# Patient Record
Sex: Female | Born: 1965 | Race: White | Hispanic: No | Smoking: Current every day smoker
Health system: Southern US, Community
[De-identification: ages and names within clinical notes are randomized; demographics above are authoritative.]

## PROBLEM LIST (undated history)

## (undated) DIAGNOSIS — J189 Pneumonia, unspecified organism: Secondary | ICD-10-CM

## (undated) DIAGNOSIS — I509 Heart failure, unspecified: Secondary | ICD-10-CM

## (undated) DIAGNOSIS — R072 Precordial pain: Secondary | ICD-10-CM

## (undated) DIAGNOSIS — J449 Chronic obstructive pulmonary disease, unspecified: Secondary | ICD-10-CM

## (undated) DIAGNOSIS — E119 Type 2 diabetes mellitus without complications: Secondary | ICD-10-CM

## (undated) DIAGNOSIS — E559 Vitamin D deficiency, unspecified: Secondary | ICD-10-CM

## (undated) DIAGNOSIS — R87629 Unspecified abnormal cytological findings in specimens from vagina: Secondary | ICD-10-CM

## (undated) DIAGNOSIS — K219 Gastro-esophageal reflux disease without esophagitis: Secondary | ICD-10-CM

## (undated) DIAGNOSIS — F419 Anxiety disorder, unspecified: Secondary | ICD-10-CM

## (undated) DIAGNOSIS — J45909 Unspecified asthma, uncomplicated: Secondary | ICD-10-CM

## (undated) DIAGNOSIS — M1990 Unspecified osteoarthritis, unspecified site: Secondary | ICD-10-CM

## (undated) DIAGNOSIS — K760 Fatty (change of) liver, not elsewhere classified: Secondary | ICD-10-CM

## (undated) DIAGNOSIS — I2510 Atherosclerotic heart disease of native coronary artery without angina pectoris: Secondary | ICD-10-CM

## (undated) DIAGNOSIS — G4730 Sleep apnea, unspecified: Secondary | ICD-10-CM

## (undated) DIAGNOSIS — F32A Depression, unspecified: Secondary | ICD-10-CM

## (undated) DIAGNOSIS — R011 Cardiac murmur, unspecified: Secondary | ICD-10-CM

## (undated) DIAGNOSIS — E079 Disorder of thyroid, unspecified: Secondary | ICD-10-CM

## (undated) HISTORY — PX: TUBAL LIGATION: SHX77

## (undated) HISTORY — DX: Anxiety disorder, unspecified: F41.9

## (undated) HISTORY — DX: Sleep apnea, unspecified: G47.30

## (undated) HISTORY — DX: Depression, unspecified: F32.A

## (undated) HISTORY — DX: Gastro-esophageal reflux disease without esophagitis: K21.9

## (undated) HISTORY — PX: COSMETIC SURGERY: SHX468

## (undated) HISTORY — DX: Disorder of thyroid, unspecified: E07.9

## (undated) HISTORY — DX: Vitamin D deficiency, unspecified: E55.9

## (undated) HISTORY — PX: OTHER SURGICAL HISTORY: SHX169

## (undated) HISTORY — DX: Unspecified abnormal cytological findings in specimens from vagina: R87.629

---

## 2017-03-19 ENCOUNTER — Encounter: Payer: Self-pay | Admitting: Podiatry

## 2017-03-19 ENCOUNTER — Ambulatory Visit (INDEPENDENT_AMBULATORY_CARE_PROVIDER_SITE_OTHER): Payer: Medicaid Other | Admitting: Podiatry

## 2017-03-19 ENCOUNTER — Ambulatory Visit (INDEPENDENT_AMBULATORY_CARE_PROVIDER_SITE_OTHER): Payer: Medicaid Other

## 2017-03-19 VITALS — BP 105/67 | HR 77 | Resp 18

## 2017-03-19 DIAGNOSIS — R609 Edema, unspecified: Secondary | ICD-10-CM

## 2017-03-19 DIAGNOSIS — E1149 Type 2 diabetes mellitus with other diabetic neurological complication: Secondary | ICD-10-CM | POA: Diagnosis not present

## 2017-03-19 DIAGNOSIS — G8929 Other chronic pain: Secondary | ICD-10-CM

## 2017-03-19 DIAGNOSIS — M79673 Pain in unspecified foot: Secondary | ICD-10-CM

## 2017-03-19 DIAGNOSIS — Q828 Other specified congenital malformations of skin: Secondary | ICD-10-CM

## 2017-03-19 DIAGNOSIS — Q6689 Other  specified congenital deformities of feet: Secondary | ICD-10-CM

## 2017-03-19 MED ORDER — HYDROCODONE-ACETAMINOPHEN 7.5-300 MG PO TABS: 1.0000 | ORAL_TABLET | Freq: Two times a day (BID) | ORAL | 0 refills | Status: DC

## 2017-03-19 NOTE — Progress Notes (Signed)
Subjective:    Patient ID: Rachael Watson, female   DOB: 51 y.o.   MRN: [id number]   HPI Rachael Watson Presents the office they for concerns of painful neuropathy to both of her feet. She dyspnea to from [place] and she was under the care of a podiatrist in [place] where she was given hydrocodone for neuropathy. She has a states that she has painful calluses to her feet. She gets quite a bit of pain to her feet. Nighttime issues. He tried gabapentin and Lyrica without any significant improvement and this is why she was put on vicodin for pain. She states that she takes 2 a day but more as needed. She denies any open sores to her feet. She denies any claudication symptoms. She states she had diabetic shoes but she states that did not help and she does not want new ones. She has no other concerns.  Review of Systems  All other systems reviewed and are negative.       Objective:  Physical Exam General: AAO x3, NAD  Dermatological: Hyperkeratotic lesions bilateral hallux as well as submetatarsal 2. No underlying ulceration, drainage or any signs of infection present. There is no other open lesions or pre-ulcer lesions identified today. There is no overlying edema, erythema, increase in warmth bilaterally.  Vascular: Dorsalis Pedis artery and Posterior Tibial artery pedal pulses are 2/4 bilateral with immedate capillary fill time. P There is no pain with calf compression, swelling, warmth, erythema.   Neruologic: Sensation decreased with Rachael Watson monofilament. Decreased vibratory sensation bilaterally.  Musculoskeletal: There is no area pinpoint bony tenderness and no pain vibratory sensation. There is mild discomfort on the plantar medial tubercle of the calcaneus at the insertion the plantar fascia bilaterally but is no pain with lateral compression. No pain on the Achilles tendon. There is no area pinpoint bony tenderness. She states that she gets pain in both of her feet but this is from the  neuropathy she's been having her symptoms been unchanged for several years this point.  Gait: Unassisted, Nonantalgic.      Assessment:     51 year old female with symptomatic diabetic neuropathy, hyperkeratotic lesions     Plan:     -Treatment options discussed including all alternatives, risks, and complications -Etiology of symptoms were discussed -X-rays were obtained and reviewed with the patient. No evidence of acute fracture identified today. Joint space maintained -Hyperkeratotic lesions were sharply debrided 4 without any complications or bleeding. -Discussed stretching exercises for plantar fasciitis his heel pain. She states that she has neuropathy not causing her pain. I did give her one-time prescription of Vicodin and will place referral for pain management as I will not be managing her chronic pain. She understands this and she agrees to pain management. -Daily foot inspection discussed. -Follow-up in 3 months for routine care or sooner if any issues are to arise. Call any questions or concerns in the meantime.  Celesta Gentile, DPM

## 2017-03-22 ENCOUNTER — Telehealth: Payer: Self-pay | Admitting: *Deleted

## 2017-03-22 NOTE — Telephone Encounter (Addendum)
Faxed required form, clinicals and demographics to [facility].03/24/2017-[place] [facility] states pt has been scheduled for 04/28/2017 at 1:00pm with Dr. Brandy Hale, pt is aware of the appt.

## 2017-04-06 LAB — GLUCOSE, POCT (MANUAL RESULT ENTRY): POC Glucose: 91 mg/dl (ref 70–99)

## 2017-04-12 ENCOUNTER — Encounter: Payer: Self-pay | Admitting: Internal Medicine

## 2017-04-29 ENCOUNTER — Telehealth: Payer: Self-pay | Admitting: *Deleted

## 2017-04-29 NOTE — Telephone Encounter (Addendum)
[place] states pt cancelled today's 1:00pm appt, stating she used CBD oil, and the office manager told her it would go on record. Pt may be on Medicaid but currently have as self-pay. Pt called states she would like to go to a pain management closer to her due to spinal stenosis, peripheral neuropathy and liver problems, and after 5 minutes in the car she can barely stand to walk. I told pt Dr. Jacqualyn Posey would send to [facility]. Pt states she doesn't know when she would get an appt, and would like a refill of the pain medication. I told her I would inform Dr. Jacqualyn Posey of her request. I was unable to get through to [facility] by [phone] multiple attempts, the service stated the call could not be completed as dialed. I googled the [facility] and it was the phone number given by the pt. I left message informing pt I would need either a new number or for [facility] to contact me with their information including fax.

## 2017-04-30 MED ORDER — HYDROCODONE-ACETAMINOPHEN 7.5-300 MG PO TABS: ORAL_TABLET | ORAL | 0 refills | Status: DC

## 2017-04-30 NOTE — Telephone Encounter (Signed)
I informed pt of Dr. Leigh Aurora orders and she stated understanding. Pt states she is going to the [facility] instead of changing to [facility]. I spoke with [place] and she states pt was a No Show so she will have to see if their office manager will allow them to give her an appt.

## 2017-04-30 NOTE — Addendum Note (Signed)
Addended by: Harriett Sine D on: 04/30/2017 03:29 PM   Modules accepted: Orders

## 2017-04-30 NOTE — Addendum Note (Signed)
Addended by: Harriett Sine D on: 04/30/2017 03:26 PM   Modules accepted: Orders

## 2017-04-30 NOTE — Telephone Encounter (Signed)
She can have one last prescription of 10 pills but after that she needs to go to pain management.

## 2017-05-20 DIAGNOSIS — M79641 Pain in right hand: Secondary | ICD-10-CM | POA: Insufficient documentation

## 2017-06-08 ENCOUNTER — Ambulatory Visit: Payer: Medicaid Other | Admitting: Emergency Medicine

## 2017-06-08 ENCOUNTER — Encounter: Payer: Self-pay | Admitting: Internal Medicine

## 2017-06-08 ENCOUNTER — Telehealth: Payer: Self-pay | Admitting: Family Medicine

## 2017-06-08 NOTE — Telephone Encounter (Signed)
Copied from [place]. Topic: Medical Record Request - Patient ROI Request >> Jun 08, 2017 11:55 AM Yvette Rack wrote: Patient Name/DOB/MRN #: Rachael Watson  DOB December 21, 2065 MRN [id number] Requestor Name/Agency: [name] (self) Call Back #: [phone] Information Requested: all records that was sent to [facility] today  Please fax over by today so [facility] (fax [phone] Attn: Nicholes Mango ) can schedule her an appointment for her to be seen ASAP Phone number [phone]  Patient was never seen today because of insurance   Route to [facility] for [place] clinics. For all other clinics, route to the clinic's PEC Pool.

## 2017-06-08 NOTE — Telephone Encounter (Signed)
I called pt and informed her I could not find her medical records that  she said was sent here. She has had 2 appointments with PCP and they both have been cancelled. I looked in the providers box she was going to be seen by-her records were not there. She is going to call her previous doc and get them sent to [facility].

## 2017-06-21 ENCOUNTER — Ambulatory Visit: Payer: Medicaid Other | Admitting: Family Medicine

## 2017-06-21 NOTE — Progress Notes (Deleted)
   Subjective: [facility]:EN[facility]POE[facility] care, *** HPI: Rachael Watson is a 51 y.o. female presenting to clinic today for:  1. ***  Past Medical History:  Diagnosis Date  . GERD (gastroesophageal reflux disease)   . Thyroid disease    *** The histories are not reviewed yet. Please review them in the "History" navigator section and refresh this [place]. Social History   Socioeconomic History  . Marital status: Married    Spouse name: Not on file  . Number of children: Not on file  . Years of education: Not on file  . Highest education level: Not on file  Social Needs  . Financial resource strain: Not on file  . Food insecurity - worry: Not on file  . Food insecurity - inability: Not on file  . Transportation needs - medical: Not on file  . Transportation needs - non-medical: Not on file  Occupational History  . Not on file  Tobacco Use  . Smoking status: Unknown If Ever Smoked  . Smokeless tobacco: Never Used  Substance and Sexual Activity  . Alcohol use: Not on file  . Drug use: Not on file  . Sexual activity: Not on file  Other Topics Concern  . Not on file  Social History Narrative  . Not on file   No outpatient medications have been marked as taking for the 06/21/17 encounter (Appointment) with Janora Norlander, DO.   No family history on file. No Known Allergies   Health Maintenance: ***  Flu Vaccine: {YES/NO/WILD PNTIR:44315}  Tdap Vaccine: {YES/NO/WILD QMGQQ:76195}  - every 38yrs - (<3 lifetime doses or unknown): all wounds -- look up need for Tetanus IG - (>=3 lifetime doses): clean/minor wound if >12yrs from previous; all other wounds if >66yrs from previous Zoster Vaccine: {YES/NO/WILD CARDS:18581} (those >50yo, once) Pneumonia Vaccine: {YES/NO/WILD KDTOI:71245} (those w/ risk factors) - (<42yr) Both: Immunocompromised, cochlear implant, CSF leak, asplenic, sickle cell, Chronic Renal Failure - (<11yr) PPSV-23 only: Heart dz, lung disease, DM, tobacco abuse,  alcoholism, cirrhosis/liver disease. - (>59yr): PPSV13 then PPSV23 in 6-12mths;  - (>77yr): repeat PPSV23 once if pt received prior to 51yo and 48yrs have passed  ROS: Per HPI  Objective: Office vital signs reviewed. There were no vitals taken for this visit.  Physical Examination:  General: Awake, alert, *** nourished, No acute distress HEENT: Normal    Neck: No masses palpated. No lymphadenopathy    Ears: Tympanic membranes intact, normal light reflex, no erythema, no bulging    Eyes: PERRLA, extraocular movement in tact, sclera ***    Nose: nasal turbinates moist, *** nasal discharge    Throat: moist mucus membranes, no erythema, *** tonsillar exudate.  Airway is patent Cardio: regular rate and rhythm, S1S2 heard, no murmurs appreciated Pulm: clear to auscultation bilaterally, no wheezes, rhonchi or rales; normal work of breathing on room air GI: soft, non-tender, non-distended, bowel sounds present x4, no hepatomegaly, no splenomegaly, no masses GU: external vaginal tissue ***, cervix ***, *** punctate lesions on cervix appreciated, *** discharge from cervical os, *** bleeding, *** cervical motion tenderness, *** abdominal/ adnexal masses Extremities: warm, well perfused, No edema, cyanosis or clubbing; +*** pulses bilaterally MSK: *** gait and *** station Skin: dry; intact; no rashes or lesions Neuro: *** Strength and light touch sensation grossly intact, *** DTRs ***/4  Assessment/ Plan: 51 y.o. female   No problem-specific Assessment & Plan notes found for this encounter.   Janora Norlander, DO [place] [phone]

## 2017-07-01 DIAGNOSIS — M79676 Pain in unspecified toe(s): Secondary | ICD-10-CM

## 2017-07-02 ENCOUNTER — Ambulatory Visit: Payer: Medicaid Other | Admitting: Family

## 2017-07-02 ENCOUNTER — Encounter: Payer: Self-pay | Admitting: Family

## 2017-07-02 ENCOUNTER — Telehealth: Payer: Self-pay | Admitting: Family

## 2017-07-02 ENCOUNTER — Ambulatory Visit (INDEPENDENT_AMBULATORY_CARE_PROVIDER_SITE_OTHER): Payer: Medicaid Other

## 2017-07-02 VITALS — BP 97/63 | HR 71 | Temp 97.3°F | Ht 66.0 in | Wt 190.8 lb

## 2017-07-02 DIAGNOSIS — F411 Generalized anxiety disorder: Secondary | ICD-10-CM | POA: Diagnosis not present

## 2017-07-02 DIAGNOSIS — Z794 Long term (current) use of insulin: Principal | ICD-10-CM

## 2017-07-02 DIAGNOSIS — M48061 Spinal stenosis, lumbar region without neurogenic claudication: Secondary | ICD-10-CM | POA: Diagnosis not present

## 2017-07-02 DIAGNOSIS — M79642 Pain in left hand: Secondary | ICD-10-CM | POA: Diagnosis not present

## 2017-07-02 DIAGNOSIS — Z114 Encounter for screening for human immunodeficiency virus [HIV]: Secondary | ICD-10-CM | POA: Diagnosis not present

## 2017-07-02 DIAGNOSIS — Z1211 Encounter for screening for malignant neoplasm of colon: Secondary | ICD-10-CM

## 2017-07-02 DIAGNOSIS — E1149 Type 2 diabetes mellitus with other diabetic neurological complication: Secondary | ICD-10-CM

## 2017-07-02 DIAGNOSIS — K219 Gastro-esophageal reflux disease without esophagitis: Secondary | ICD-10-CM

## 2017-07-02 DIAGNOSIS — E1169 Type 2 diabetes mellitus with other specified complication: Secondary | ICD-10-CM | POA: Diagnosis not present

## 2017-07-02 DIAGNOSIS — F321 Major depressive disorder, single episode, moderate: Secondary | ICD-10-CM | POA: Diagnosis not present

## 2017-07-02 DIAGNOSIS — E119 Type 2 diabetes mellitus without complications: Secondary | ICD-10-CM | POA: Insufficient documentation

## 2017-07-02 DIAGNOSIS — M792 Neuralgia and neuritis, unspecified: Secondary | ICD-10-CM | POA: Insufficient documentation

## 2017-07-02 LAB — BAYER DCA HB A1C WAIVED: HB A1C: 5 % (ref <7.0)

## 2017-07-02 MED ORDER — GABAPENTIN 100 MG PO CAPS: ORAL_CAPSULE | ORAL | 1 refills | Status: DC

## 2017-07-02 MED ORDER — LEVOTHYROXINE SODIUM 100 MCG PO TABS: 100.0000 ug | ORAL_TABLET | Freq: Every day | ORAL | 1 refills | Status: DC

## 2017-07-02 MED ORDER — BLOOD GLUCOSE METER KIT: PACK | 0 refills | Status: DC

## 2017-07-02 MED ORDER — MECLIZINE HCL 12.5 MG PO TABS: ORAL_TABLET | ORAL | 2 refills | Status: DC

## 2017-07-02 MED ORDER — OMEPRAZOLE 20 MG PO CPDR: 20.0000 mg | DELAYED_RELEASE_CAPSULE | Freq: Every day | ORAL | 1 refills | Status: DC

## 2017-07-02 MED ORDER — BACLOFEN 10 MG PO TABS: ORAL_TABLET | ORAL | 3 refills | Status: DC

## 2017-07-02 NOTE — Progress Notes (Signed)
Subjective:    Patient ID: Rachael Watson, female    DOB: [birth year], 51 y.o.   MRN: [id number]  Pt presents to the office today to establish care. PT is followed by Pain Clinic every month for chronic low back pain.  Diabetes  She presents for her follow-up diabetic visit. She has type 2 diabetes mellitus. Her disease course has been stable. Associated symptoms include foot paresthesias. Pertinent negatives for diabetes include no blurred vision and no visual change. Symptoms are stable. Diabetic complications include heart disease and peripheral neuropathy. Pertinent negatives for diabetic complications include no CVA. She is following a generally unhealthy diet.  Depression         This is a chronic problem.  The current episode started more than 1 year ago.   The onset quality is gradual.   The problem occurs intermittently.  Associated symptoms include decreased concentration, restlessness and sad.  Associated symptoms include no helplessness and no hopelessness.  Past treatments include SSRIs - Selective serotonin reuptake inhibitors.  Past medical history includes chronic pain and anxiety.   Anxiety  Presents for follow-up visit. Symptoms include decreased concentration, excessive worry, irritability and restlessness. Symptoms occur most days. The severity of symptoms is moderate.    Back Pain  This is a chronic problem. The current episode started more than 1 year ago. The problem occurs constantly. The problem has been waxing and waning since onset. The pain is present in the lumbar spine. The quality of the pain is described as aching. The pain is at a severity of 10/10. The pain is moderate. Pertinent negatives include no numbness or tingling. She has tried analgesics for the symptoms. The treatment provided mild relief.  Gastroesophageal Reflux  She complains of heartburn. She reports no belching or no coughing. This is a chronic problem. The current episode started more than 1 year  ago. The problem occurs occasionally. She has tried a histamine-2 antagonist for the symptoms. The treatment provided moderate relief.  Hand Injury   The incident occurred 2 days ago. The injury mechanism was a direct blow. The pain is present in the left hand. The quality of the pain is described as aching. The pain does not radiate. The pain is at a severity of 9/10. The pain is moderate. The pain has been intermittent since the incident. Pertinent negatives include no numbness or tingling. The symptoms are aggravated by palpation. She has tried rest for the symptoms. The treatment provided mild relief.  Diabetic Neuropathy Pt currently taking gabapentin at bedtime as needed.     Review of Systems  Constitutional: Positive for irritability.  Eyes: Negative for blurred vision.  Respiratory: Negative for cough.   Gastrointestinal: Positive for heartburn.  Musculoskeletal: Positive for arthralgias and back pain.  Neurological: Negative for tingling and numbness.  Psychiatric/Behavioral: Positive for decreased concentration and depression.  All other systems reviewed and are negative.  History reviewed. No pertinent family history.  Social History   Socioeconomic History  . Marital status: Divorced    Spouse name: None  . Number of children: None  . Years of education: None  . Highest education level: None  Social Needs  . Financial resource strain: None  . Food insecurity - worry: None  . Food insecurity - inability: None  . Transportation needs - medical: None  . Transportation needs - non-medical: None  Occupational History  . None  Tobacco Use  . Smoking status: Current Every Day Smoker    Packs/day: 1.00  Types: Cigarettes  . Smokeless tobacco: Never Used  Substance and Sexual Activity  . Alcohol use: No    Frequency: Never  . Drug use: No  . Sexual activity: None  Other Topics Concern  . None  Social History Narrative  . None       Objective:   Physical  Exam  Constitutional: She is oriented to person, place, and time. She appears well-developed and well-nourished. No distress.  HENT:  Head: Normocephalic and atraumatic.  Right Ear: External ear normal.  Left Ear: External ear normal.  Nose: Nose normal.  Mouth/Throat: Oropharynx is clear and moist.  Eyes: Pupils are equal, round, and reactive to light.  Neck: Normal range of motion. Neck supple. No thyromegaly present.  Cardiovascular: Normal rate, regular rhythm, normal heart sounds and intact distal pulses.  No murmur heard. Pulmonary/Chest: Effort normal and breath sounds normal. No respiratory distress. She has no wheezes.  Abdominal: Soft. Bowel sounds are normal. She exhibits no distension. There is no tenderness.  Musculoskeletal: She exhibits tenderness (left hand). She exhibits no edema.  Lower lumbar pain with flexion and extension  Neurological: She is alert and oriented to person, place, and time.  Skin: Skin is warm and dry.  Psychiatric: She has a normal mood and affect. Her behavior is normal. Judgment and thought content normal.  Vitals reviewed.   BP 97/63   Pulse 71   Temp (!) 97.3 F (36.3 C) (Oral)   Ht _0  (1.676 m)   Wt 190 lb 12.8 oz (86.5 kg)   BMI 30.80 kg/m      Assessment & Plan:  1. Type 2 diabetes mellitus with other specified complication, with long-term current use of insulin (HCC) - BMP8+EGFR - Hepatic function panel - Lipid panel - Microalbumin / creatinine urine ratio - Bayer DCA Hb A1c Waived  2. Spinal stenosis of lumbar region, unspecified whether neurogenic claudication present - BMP8+EGFR - Hepatic function panel  3. Other diabetic neurological complication associated with type 2 diabetes mellitus (HCC) - BMP8+EGFR - Hepatic function panel  4. Depression, major, single episode, moderate (HCC) - BMP8+EGFR - Hepatic function panel  5. GAD (generalized anxiety disorder) - BMP8+EGFR - Hepatic function panel  6. Colon cancer  screening - Ambulatory referral to Gastroenterology - BMP8+EGFR - Hepatic function panel  7. Encounter for screening for HIV - BMP8+EGFR - Hepatic function panel - HIV antibody  8. Gastroesophageal reflux disease, esophagitis presence not specified  9. Pain of left hand - DG Hand Complete Left; Future   Continue all meds Labs pending Health Maintenance reviewed Diet and exercise encouraged RTO 3 months   Evelina Dun, FNP

## 2017-07-02 NOTE — Telephone Encounter (Signed)
Patient seen Rachael Watson today and forgot to tell her she needs refills on all her medications except norco and Celexa. Please send to [facility].

## 2017-07-02 NOTE — Patient Instructions (Signed)
Diabetic Neuropathy Diabetic neuropathy is a nerve disease or nerve damage that is caused by diabetes mellitus. About half of all people with diabetes mellitus have some form of nerve damage. Nerve damage is more common in those who have had diabetes mellitus for many years and who generally have not had good control of their blood sugar (glucose) level. Diabetic neuropathy is a common complication of diabetes mellitus. There are three common types of diabetic neuropathy and a fourth type that is less common and less understood:  Peripheral neuropathy-This is the most common type of diabetic neuropathy. It causes damage to the nerves of the feet and legs first and then eventually the hands and arms. The damage affects the ability to sense touch.  Autonomic neuropathy-This type causes damage to the autonomic nervous system, which controls the following functions: ? Heartbeat. ? Body temperature. ? Blood pressure. ? Urination. ? Digestion. ? Sweating. ? Sexual function.  Focal neuropathy-Focal neuropathy can be painful and unpredictable and occurs most often in older adults with diabetes mellitus. It involves a specific nerve or one area and often comes on suddenly. It usually does not cause long-term problems.  Radiculoplexus neuropathy- Sometimes called lumbosacral radiculoplexus neuropathy, radiculoplexus neuropathy affects the nerves of the thighs, hips, buttocks, or legs. It is more common in people with type 2 diabetes mellitus and in older men. It is characterized by debilitating pain, weakness, and atrophy, usually in the thigh muscles.  What are the causes? The cause of peripheral, autonomic, and focal neuropathies is diabetes mellitus that is uncontrolled and high glucose levels. The cause of radiculoplexus neuropathy is unknown. However, it is thought to be caused by inflammation related to uncontrolled glucose levels. What are the signs or symptoms? Peripheral Neuropathy Peripheral  neuropathy develops slowly over time. When the nerves of the feet and legs no longer work there may be:  Burning, stabbing, or aching pain in the legs or feet.  Inability to feel pressure or pain in your feet. This can lead to: ? Thick calluses over pressure areas. ? Pressure sores. ? Ulcers.  Foot deformities.  Reduced ability to feel temperature changes.  Muscle weakness.  Autonomic Neuropathy The symptoms of autonomic neuropathy vary depending on which nerves are affected. Symptoms may include:  Problems with digestion, such as: ? Feeling sick to your stomach (nausea). ? Vomiting. ? Bloating. ? Constipation. ? Diarrhea. ? Abdominal pain.  Difficulty with urination. This occurs if you lose your ability to sense when your bladder is full. Problems include: ? Urine leakage (incontinence). ? Inability to empty your bladder completely (retention).  Rapid or irregular heartbeat (palpitations).  Blood pressure drops when you stand up (orthostatic hypotension). When you stand up you may feel: ? Dizzy. ? Weak. ? Faint.  In men, inability to attain and maintain an erection.  In women, vaginal dryness and problems with decreased sexual desire and arousal.  Problems with body temperature regulation.  Increased or decreased sweating.  Focal Neuropathy  Abnormal eye movements or abnormal alignment of both eyes.  Weakness in the wrist.  Foot drop. This results in an inability to lift the foot properly and abnormal walking or foot movement.  Paralysis on one side of your face (Bell palsy).  Chest or abdominal pain. Radiculoplexus Neuropathy  Sudden, severe pain in your hip, thigh, or buttocks.  Weakness and wasting of thigh muscles.  Difficulty rising from a seated position.  Abdominal swelling.  Unexplained weight loss (usually more than 10 lb [4.5 kg]). How is   this diagnosed? Peripheral Neuropathy Your senses may be tested. Sensory function testing can be  done with:  A light touch using a monofilament.  A vibration with tuning fork.  A sharp sensation with a pin prick.  Other tests that can help diagnose neuropathy are:  Nerve conduction velocity. This test checks the transmission of an electrical current through a nerve.  Electromyography. This shows how muscles respond to electrical signals transmitted by nearby nerves.  Quantitative sensory testing. This is used to assess how your nerves respond to vibrations and changes in temperature.  Autonomic Neuropathy Diagnosis is often based on reported symptoms. Tell your health care provider if you experience:  Dizziness.  Constipation.  Diarrhea.  Inappropriate urination or inability to urinate.  Inability to get or maintain an erection.  Tests that may be done include:  Electrocardiography or Holter monitor. These are tests that can help show problems with the heart rate or heart rhythm.  An X-ray exam may be done.  Focal Neuropathy Diagnosis is made based on your symptoms and what your health care provider finds during your exam. Other tests may be done. They may include:  Nerve conduction velocities. This checks the transmission of electrical current through a nerve.  Electromyography. This shows how muscles respond to electrical signals transmitted by nearby nerves.  Quantitative sensory testing. This test is used to assess how your nerves respond to vibration and changes in temperature.  Radiculoplexus Neuropathy  Often the first thing is to eliminate any other issue or problems that might be the cause, as there is no standard test for diagnosis.  X-ray exam of your spine and lumbar region.  Spinal tap to rule out cancer.  MRI to rule out other lesions. How is this treated? Once nerve damage occurs, it cannot be reversed. The goal of treatment is to keep the disease or nerve damage from getting worse and affecting more nerve fibers. Controlling your blood  glucose level is the key. Most people with radiculoplexus neuropathy see at least a partial improvement over time. You will need to keep your blood glucose and HbA1c levels in the target range determined by your health care provider. Things that help control blood glucose levels include:  Blood glucose monitoring.  Meal planning.  Physical activity.  Diabetes medicine.  Over time, maintaining lower blood glucose levels helps lessen symptoms. Sometimes, prescription pain medicine is needed. Follow these instructions at home:  Do not smoke.  Keep your blood glucose level in the range that you and your health care provider have determined acceptable for you.  Keep your blood pressure level in the range that you and your health care provider have determined acceptable for you.  Eat a well-balanced diet.  Be physically active every day. Include strength training and balance exercises.  Protect your feet. ? Check your feet every day for sores, cuts, blisters, or signs of infection. ? Wear padded socks and supportive shoes. Use orthotic inserts, if necessary. ? Regularly check the insides of your shoes for worn spots. Make sure there are no rocks or other items inside your shoes before you put them on. Contact a health care provider if:  You have burning, stabbing, or aching pain in the legs or feet.  You are unable to feel pressure or pain in your feet.  You develop problems with digestion such as: ? Nausea. ? Vomiting. ? Bloating. ? Constipation. ? Diarrhea. ? Abdominal pain.  You have difficulty with urination, such as: ? Incontinence. ? Retention.    You have palpitations.  You develop orthostatic hypotension. When you stand up you may feel: ? Dizzy. ? Weak. ? Faint.  You cannot attain and maintain an erection (in men).  You have vaginal dryness and problems with decreased sexual desire and arousal (in women).  You have severe pain in your thighs, legs, or  buttocks.  You have unexplained weight loss. This information is not intended to replace advice given to you by your health care provider. Make sure you discuss any questions you have with your health care provider. Document Released: 08/31/2001 Document Revised: 11/28/2015 Document Reviewed: 12/01/2012 Elsevier Interactive Patient Education  2017 [employer].  

## 2017-07-02 NOTE — Telephone Encounter (Signed)
Sent these to [facility]

## 2017-07-03 LAB — HEPATIC FUNCTION PANEL
ALT: 56 IU/L — AB (ref 0–32)
AST: 37 IU/L (ref 0–40)
Albumin: 4.4 g/dL (ref 3.5–5.5)
Alkaline Phosphatase: 86 IU/L (ref 39–117)
Bilirubin Total: 0.4 mg/dL (ref 0.0–1.2)
Bilirubin, Direct: 0.07 mg/dL (ref 0.00–0.40)
TOTAL PROTEIN: 7 g/dL (ref 6.0–8.5)

## 2017-07-03 LAB — MICROALBUMIN / CREATININE URINE RATIO
Creatinine, Urine: 34.5 mg/dL
Microalb/Creat Ratio: <8.7 mg/g creat (ref 0.0–30.0)
Microalbumin, Urine: <3 ug/mL

## 2017-07-03 LAB — BMP8+EGFR
BUN/Creatinine Ratio: 14 (ref 9–23)
BUN: 11 mg/dL (ref 6–24)
CO2: 19 mmol/L — ABNORMAL LOW (ref 20–29)
Calcium: 9.6 mg/dL (ref 8.7–10.2)
Chloride: 101 mmol/L (ref 96–106)
Creatinine, Ser: 0.76 mg/dL (ref 0.57–1.00)
GFR calc Af Amer: 105 mL/min/{1.73_m2} (ref >59)
GFR calc non Af Amer: 91 mL/min/{1.73_m2} (ref >59)
Glucose: 87 mg/dL (ref 65–99)
Potassium: 4.7 mmol/L (ref 3.5–5.2)
Sodium: 139 mmol/L (ref 134–144)

## 2017-07-03 LAB — LIPID PANEL
Chol/HDL Ratio: 5.4 ratio — ABNORMAL HIGH (ref 0.0–4.4)
Cholesterol, Total: 256 mg/dL — ABNORMAL HIGH (ref 100–199)
HDL: 47 mg/dL (ref >39)
LDL Calculated: 166 mg/dL — ABNORMAL HIGH (ref 0–99)
Triglycerides: 216 mg/dL — ABNORMAL HIGH (ref 0–149)
VLDL Cholesterol Cal: 43 mg/dL — ABNORMAL HIGH (ref 5–40)

## 2017-07-03 LAB — HIV ANTIBODY (ROUTINE TESTING W REFLEX): HIV Screen 4th Generation wRfx: NONREACTIVE

## 2017-07-07 ENCOUNTER — Telehealth: Payer: Self-pay | Admitting: Family

## 2017-07-07 ENCOUNTER — Other Ambulatory Visit: Payer: Self-pay | Admitting: *Deleted

## 2017-07-07 ENCOUNTER — Telehealth: Payer: Self-pay | Admitting: *Deleted

## 2017-07-07 ENCOUNTER — Encounter (INDEPENDENT_AMBULATORY_CARE_PROVIDER_SITE_OTHER): Payer: Self-pay | Admitting: *Deleted

## 2017-07-07 MED ORDER — GABAPENTIN 100 MG PO CAPS: ORAL_CAPSULE | ORAL | 1 refills | Status: DC

## 2017-07-07 MED ORDER — LEVOTHYROXINE SODIUM 100 MCG PO TABS: 100.0000 ug | ORAL_TABLET | Freq: Every day | ORAL | 1 refills | Status: DC

## 2017-07-07 MED ORDER — BACLOFEN 10 MG PO TABS: ORAL_TABLET | ORAL | 3 refills | Status: DC

## 2017-07-07 MED ORDER — OMEPRAZOLE 20 MG PO CPDR: 20.0000 mg | DELAYED_RELEASE_CAPSULE | Freq: Every day | ORAL | 1 refills | Status: DC

## 2017-07-07 NOTE — Telephone Encounter (Signed)
Script for meter and supplies faxed to [facility].

## 2017-07-07 NOTE — Telephone Encounter (Signed)
Patient was called and answered phone saying," I am at an appointment and will have to call back".   If patient calls back she should be informed scripts were sent to [facility] and referral is in process but no appointment.  [facility] referral will call when scheduled.

## 2017-07-07 NOTE — Telephone Encounter (Signed)
All medications that were [facility] to [facility] have been cancelled and resent to [facility].

## 2017-07-12 ENCOUNTER — Telehealth: Payer: Self-pay | Admitting: Family

## 2017-07-12 ENCOUNTER — Other Ambulatory Visit: Payer: Self-pay | Admitting: Family

## 2017-07-12 MED ORDER — PROVENTIL HFA 108 (90 BASE) MCG/ACT IN AERS: INHALATION_SPRAY | RESPIRATORY_TRACT | 1 refills | Status: DC

## 2017-07-12 MED ORDER — ATORVASTATIN CALCIUM 40 MG PO TABS: 40.0000 mg | ORAL_TABLET | Freq: Every day | ORAL | 5 refills | Status: DC

## 2017-07-12 NOTE — Telephone Encounter (Signed)
Pt states [facility] got some meds transferred, she is still missing some I sent in Albuteral, she is not sure of others, will get back with [place]

## 2017-07-15 ENCOUNTER — Telehealth: Payer: Self-pay | Admitting: Family

## 2017-07-15 NOTE — Telephone Encounter (Signed)
GI appt is 4-6 months out?

## 2017-07-15 NOTE — Telephone Encounter (Signed)
FYI for provider.   Any suggestions for patient?

## 2017-07-16 NOTE — Telephone Encounter (Signed)
Dr. Nelva Bush ,  [name] ,  Tried to contact patient with out success.  They mailed a letter concerning an appointment.  [facility] has left message on patient's voice mail with this information.

## 2017-07-20 ENCOUNTER — Other Ambulatory Visit (INDEPENDENT_AMBULATORY_CARE_PROVIDER_SITE_OTHER): Payer: Self-pay | Admitting: Internal Medicine

## 2017-07-20 DIAGNOSIS — Z1211 Encounter for screening for malignant neoplasm of colon: Secondary | ICD-10-CM

## 2017-07-20 DIAGNOSIS — Z800 Family history of malignant neoplasm of digestive organs: Secondary | ICD-10-CM

## 2017-07-21 ENCOUNTER — Telehealth (INDEPENDENT_AMBULATORY_CARE_PROVIDER_SITE_OTHER): Payer: Self-pay | Admitting: *Deleted

## 2017-07-21 ENCOUNTER — Encounter (INDEPENDENT_AMBULATORY_CARE_PROVIDER_SITE_OTHER): Payer: Self-pay | Admitting: *Deleted

## 2017-07-21 DIAGNOSIS — Z800 Family history of malignant neoplasm of digestive organs: Secondary | ICD-10-CM | POA: Insufficient documentation

## 2017-07-21 DIAGNOSIS — Z1211 Encounter for screening for malignant neoplasm of colon: Secondary | ICD-10-CM | POA: Insufficient documentation

## 2017-07-21 MED ORDER — PEG 3350-KCL-NA BICARB-NACL 420 G PO SOLR
4000.0000 mL | Freq: Once | ORAL | 0 refills | Status: AC
Start: 2017-07-21 — End: 2017-07-21

## 2017-07-21 NOTE — Telephone Encounter (Signed)
Patient needs trilyte 

## 2017-07-22 ENCOUNTER — Telehealth: Payer: Self-pay | Admitting: Family

## 2017-07-22 ENCOUNTER — Ambulatory Visit: Payer: Medicaid Other | Admitting: Podiatry

## 2017-07-22 ENCOUNTER — Other Ambulatory Visit: Payer: Self-pay | Admitting: Sports Medicine

## 2017-07-22 ENCOUNTER — Encounter: Payer: Self-pay | Admitting: Podiatry

## 2017-07-22 DIAGNOSIS — Q828 Other specified congenital malformations of skin: Secondary | ICD-10-CM

## 2017-07-22 DIAGNOSIS — B351 Tinea unguium: Secondary | ICD-10-CM | POA: Diagnosis not present

## 2017-07-22 DIAGNOSIS — M545 Low back pain: Secondary | ICD-10-CM

## 2017-07-22 DIAGNOSIS — M2041 Other hammer toe(s) (acquired), right foot: Secondary | ICD-10-CM

## 2017-07-22 DIAGNOSIS — E1149 Type 2 diabetes mellitus with other diabetic neurological complication: Secondary | ICD-10-CM

## 2017-07-22 DIAGNOSIS — M79676 Pain in unspecified toe(s): Secondary | ICD-10-CM

## 2017-07-22 DIAGNOSIS — M79674 Pain in right toe(s): Secondary | ICD-10-CM

## 2017-07-22 DIAGNOSIS — M2042 Other hammer toe(s) (acquired), left foot: Secondary | ICD-10-CM

## 2017-07-22 DIAGNOSIS — M79675 Pain in left toe(s): Secondary | ICD-10-CM

## 2017-07-22 NOTE — Telephone Encounter (Signed)
Pt states she needs refills on furosemide and nebulizer and albuterol soulution and and Humalog which is not on her med list states she wants it for back up. Pt uses [facility] in [place]. Also needs fitted shoes needs a referral to a place that takes MCD. Please advise.w

## 2017-07-22 NOTE — Telephone Encounter (Signed)
Aware. Must schedule appointment for face to face on getting shoes.   She knows she will need records from her past cardiologist and liver doctor to have reviewed and placed in current chart before any ultra sounds or tests can be ordered.  No medications can be given that have not been deemed necessary per lab work results. Her A1C was normal on last blood work.

## 2017-07-23 NOTE — Progress Notes (Signed)
Subjective: 52 y.o. returns the office today for painful, elongated, thickened toenails which she cannot trim herself. Denies any redness or drainage around the nails.  She also has painful calluses to her feet that she is asking me trimmed.  Otherwise she states that she is doing well and she is been going to pain management which is been helping.  She has no other lower extremity concerns today.  Denies any acute changes since last appointment and no new complaints today. Denies any systemic complaints such as fevers, chills, nausea, vomiting.   PCP: Sharion Balloon, FNP  Objective: AAO 3, NAD DP/PT pulses palpable, CRT less than 3 seconds Protective sensation decreased with Simms Weinstein monofilament Nails hypertrophic, dystrophic, elongated, brittle, discolored 10. There is tenderness overlying the nails 1-5 bilaterally. There is no surrounding erythema or drainage along the nail sites. Hyperkeratotic lesions present bilateral submetatarsal 2, 5, bilateral hallux, bilateral lateral heels.  Upon debridement there is no underlying ulceration, drainage or any clinical signs of infection noted. No open lesions or other pre-ulcerative lesions are identified. Hammertoes are present No other areas of tenderness bilateral lower extremities. No overlying edema, erythema, increased warmth. No pain with calf compression, swelling, warmth, erythema.  Assessment: Patient presents with symptomatic onychomycosis; symptomatic onychomycosis  Plan: -Treatment options including alternatives, risks, complications were discussed -Nails sharply debrided 10 without complication/bleeding. -Hyperkeratotic lesions were sharply debrided x8 without any complications or bleeding -She is asking To Help Take Pressure off of the Callus Areas.  Do Think That a Diabetic Shoe Insert Would Be Helpful.  She States That She Has Been Diagnosed with Diabetes Although She Is Not Taking Medicine on a Regular Basis for This  but She Does Take Medication.  Prescription for [facility] was provided for diabetic shoes and inserts. -Discussed daily foot inspection. If there are any changes, to call the office immediately.  -Follow-up in 3 months or sooner if any problems are to arise. In the meantime, encouraged to call the office with any questions, concerns, changes symptoms.  Celesta Gentile, DPM

## 2017-07-30 ENCOUNTER — Telehealth: Payer: Self-pay

## 2017-07-30 ENCOUNTER — Telehealth (INDEPENDENT_AMBULATORY_CARE_PROVIDER_SITE_OTHER): Payer: Self-pay | Admitting: *Deleted

## 2017-07-30 MED ORDER — FUROSEMIDE 20 MG PO TABS: 20.0000 mg | ORAL_TABLET | Freq: Every day | ORAL | 6 refills | Status: DC

## 2017-07-30 MED ORDER — ALBUTEROL SULFATE (2.5 MG/3ML) 0.083% IN NEBU: 2.5000 mg | INHALATION_SOLUTION | Freq: Four times a day (QID) | RESPIRATORY_TRACT | 1 refills | Status: DC | PRN

## 2017-07-30 NOTE — Telephone Encounter (Signed)
Prescription sent to pharmacy.

## 2017-07-30 NOTE — Addendum Note (Signed)
Addended by: Evelina Dun A on: 07/30/2017 05:01 PM   Modules accepted: Orders

## 2017-07-30 NOTE — Telephone Encounter (Signed)
Referring MD/PCP: [employer], np -- [facility]   Procedure: tcs  Reason/Indication:  Screning, fam hx colon ca  Has patient had this procedure before?  no  If so, when, by whom and where?    Is there a family history of colon cancer?  Yes, half sister  Who?  What age when diagnosed?    Is patient diabetic?   yes      Does patient have prosthetic heart valve or mechanical valve?  no  Do you have a pacemaker?  no  Has patient ever had endocarditis? no  Has patient had joint replacement within last 12 months?  no  Is patient constipated or take laxatives? no  Does patient have a history of alcohol/drug use?  no  Is patient on Coumadin, Plavix and/or Aspirin? ues  Medications: see epic  Allergies: see epic  Medication Adjustment per Dr Laural Golden: asa 2 days  Procedure date & time: 08/27/17 at 145

## 2017-07-30 NOTE — Telephone Encounter (Signed)
Patient said she needs two meds that she was on in [place]  Lasix 20 mg qd and Albuterol Nebulizer solution

## 2017-08-02 NOTE — Telephone Encounter (Signed)
agree

## 2017-08-03 ENCOUNTER — Ambulatory Visit
Admission: RE | Admit: 2017-08-03 | Discharge: 2017-08-03 | Disposition: A | Discharge status: Home / Self Care | Payer: Medicaid Other | Source: Ambulatory Visit | Attending: Sports Medicine | Admitting: Sports Medicine

## 2017-08-03 DIAGNOSIS — M545 Low back pain: Secondary | ICD-10-CM

## 2017-08-04 ENCOUNTER — Telehealth: Payer: Self-pay

## 2017-08-04 NOTE — Telephone Encounter (Signed)
Patient called stating that her BS has been low below 90 and it has been making her feel sick and like she was going to pass out.  States that it has been going on since she passed out in 2017.  When she feels like she is going to pass out she drinks a protein shake and it helps. Offered apt today- patient declined.  Wanted to come in Friday. Apt made with [facility] 2/1.

## 2017-08-06 ENCOUNTER — Telehealth: Payer: Self-pay | Admitting: *Deleted

## 2017-08-06 ENCOUNTER — Ambulatory Visit: Payer: Medicaid Other | Admitting: Family

## 2017-08-06 ENCOUNTER — Encounter: Payer: Self-pay | Admitting: Family

## 2017-08-06 ENCOUNTER — Other Ambulatory Visit: Payer: Self-pay | Admitting: Sports Medicine

## 2017-08-06 VITALS — BP 105/65 | HR 70 | Temp 97.0°F | Ht 66.0 in | Wt 197.2 lb

## 2017-08-06 DIAGNOSIS — J42 Unspecified chronic bronchitis: Secondary | ICD-10-CM

## 2017-08-06 DIAGNOSIS — E162 Hypoglycemia, unspecified: Secondary | ICD-10-CM

## 2017-08-06 DIAGNOSIS — M5416 Radiculopathy, lumbar region: Secondary | ICD-10-CM

## 2017-08-06 DIAGNOSIS — K5900 Constipation, unspecified: Secondary | ICD-10-CM

## 2017-08-06 MED ORDER — FLUTICASONE-SALMETEROL 100-50 MCG/DOSE IN AEPB: 1.0000 | INHALATION_SPRAY | Freq: Two times a day (BID) | RESPIRATORY_TRACT | 3 refills | Status: DC

## 2017-08-06 MED ORDER — FLUTICASONE FUROATE-VILANTEROL 100-25 MCG/INH IN AEPB: 1.0000 | INHALATION_SPRAY | Freq: Every day | RESPIRATORY_TRACT | 3 refills | Status: DC

## 2017-08-06 MED ORDER — POLYETHYLENE GLYCOL 3350 17 GM/SCOOP PO POWD: 17.0000 g | Freq: Two times a day (BID) | ORAL | 1 refills | Status: DC | PRN

## 2017-08-06 NOTE — Telephone Encounter (Signed)
Breo changed to Advair.

## 2017-08-06 NOTE — Telephone Encounter (Signed)
[facility] is not covered can you change to advair

## 2017-08-06 NOTE — Patient Instructions (Signed)
Hypoglycemia Hypoglycemia is when the sugar (glucose) level in the blood is too low. Symptoms of low blood sugar may include:  Feeling: ? Hungry. ? Worried or nervous (anxious). ? Sweaty and clammy. ? Confused. ? Dizzy. ? Sleepy. ? Sick to your stomach (nauseous).  Having: ? A fast heartbeat. ? A headache. ? A change in your vision. ? Jerky movements that you cannot control (seizure). ? Nightmares. ? Tingling or no feeling (numbness) around the mouth, lips, or tongue.  Having trouble with: ? Talking. ? Paying attention (concentrating). ? Moving (coordination). ? Sleeping.  Shaking.  Passing out (fainting).  Getting upset easily (irritability).  Low blood sugar can happen to people who have diabetes and people who do not have diabetes. Low blood sugar can happen quickly, and it can be an emergency. Treating Low Blood Sugar Low blood sugar is often treated by eating or drinking something sugary right away. If you can think clearly and swallow safely, follow the 15:15 rule:  Take 15 grams of a fast-acting carb (carbohydrate). Some fast-acting carbs are: ? 1 tube of glucose gel. ? 3 sugar tablets (glucose pills). ? 6-8 pieces of hard candy. ? 4 oz (120 mL) of fruit juice. ? 4 oz (120 mL) of regular (not diet) soda.  Check your blood sugar 15 minutes after you take the carb.  If your blood sugar is still at or below 70 mg/dL (3.9 mmol/L), take 15 grams of a carb again.  If your blood sugar does not go above 70 mg/dL (3.9 mmol/L) after 3 tries, get help right away.  After your blood sugar goes back to normal, eat a meal or a snack within 1 hour.  Treating Very Low Blood Sugar If your blood sugar is at or below 54 mg/dL (3 mmol/L), you have very low blood sugar (severe hypoglycemia). This is an emergency. Do not wait to see if the symptoms will go away. Get medical help right away. Call your local emergency services (911 in the [place] Do not drive yourself to the  hospital. If you have very low blood sugar and you cannot eat or drink, you may need a glucagon shot (injection). A family member or friend should learn how to check your blood sugar and how to give you a glucagon shot. Ask your doctor if you need to have a glucagon shot kit at home. Follow these instructions at home: General instructions  Avoid any diets that cause you to not eat enough food. Talk with your doctor before you start any new diet.  Take over-the-counter and prescription medicines only as told by your doctor.  Limit alcohol to no more than 1 drink per day for nonpregnant women and 2 drinks per day for men. One drink equals 12 oz of beer, 5 oz of wine, or 1 oz of hard liquor.  Keep all follow-up visits as told by your doctor. This is important. If You Have Diabetes:   Make sure you know the symptoms of low blood sugar.  Always keep a source of sugar with you, such as: ? Sugar. ? Sugar tablets. ? Glucose gel. ? Fruit juice. ? Regular soda (not diet soda). ? Milk. ? Hard candy. ? Honey.  Take your medicines as told.  Follow your exercise and meal plan. ? Eat on time. Do not skip meals. ? Follow your sick day plan when you cannot eat or drink normally. Make this plan ahead of time with your doctor.  Check your blood sugar as often   as told by your doctor. Always check before and after exercise.  Share your diabetes care plan with: ? Your work or school. ? People you live with.  Check your pee (urine) for ketones: ? When you are sick. ? As told by your doctor.  Carry a card or wear jewelry that says you have diabetes. If You Have Low Blood Sugar From Other Causes:   Check your blood sugar as often as told by your doctor.  Follow instructions from your doctor about what you cannot eat or drink. Contact a doctor if:  You have trouble keeping your blood sugar in your target range.  You have low blood sugar often. Get help right away if:  You still have  symptoms after you eat or drink something sugary.  Your blood sugar is at or below 54 mg/dL (3 mmol/L).  You have jerky movements that you cannot control.  You pass out. These symptoms may be an emergency. Do not wait to see if the symptoms will go away. Get medical help right away. Call your local emergency services (911 in the [place] Do not drive yourself to the hospital. This information is not intended to replace advice given to you by your health care provider. Make sure you discuss any questions you have with your health care provider. Document Released: 09/16/2009 Document Revised: 11/28/2015 Document Reviewed: 07/26/2015 Elsevier Interactive Patient Education  [employer].

## 2017-08-06 NOTE — Progress Notes (Signed)
   Subjective:    Patient ID: Rachael Watson, female    DOB: 1965/08/30, 52 y.o.   MRN: [id number]   Pt presents to the office today for hypoglycemia. Pt states at night her BS are dropping to "67" and even "30's". PT's A1C  On 07/02/18 was 5.0. Pt states was a diabetic in the past.   She reports when her BS was 30 that happen once and it occurred when they first moved here. States the lowest BS over the last few weeks has been 67. She reports she "eats all the time".  Complaining of chronic constipation and takes fiber OTC that helps, but request rx.  Has appt with GI 08/30/17 and colonoscopy on 02/22/1. 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 Request rx for rolling walker. Discussed Ortho should be able to write this for her.   PT also requesting nebulizer machine for her COPD. PT states she continues to smoke, but has "cut back a lot" about 1/2 a pack a day.  Cough  This is a chronic problem. The current episode started more than 1 month ago. The problem has been waxing and waning. The problem occurs every few minutes.       Review of Systems  Respiratory: Positive for cough.   All other systems reviewed and are negative.      Objective:   Physical Exam  Constitutional: She is oriented to person, place, and time. She appears well-developed and well-nourished. No distress.  HENT:  Head: Normocephalic.  Eyes: Pupils are equal, round, and reactive to light.  Neck: Normal range of motion. Neck supple. No thyromegaly present.  Cardiovascular: Normal rate, regular rhythm, normal heart sounds and intact distal pulses.  No murmur heard. Pulmonary/Chest: Effort normal and breath sounds normal. No respiratory distress. She has no wheezes.  Abdominal: Soft. Bowel sounds are normal. She exhibits no distension. There is no tenderness.  Musculoskeletal: Normal range of motion. She exhibits no edema or tenderness.  Neurological: She is alert and oriented to  person, place, and time.  Skin: Skin is warm and dry.  Psychiatric: She has a normal mood and affect. Her behavior is normal. Judgment and thought content normal.  Vitals reviewed.    BP 105/65   Pulse 70   Temp (!) 97 F (36.1 C) (Oral)   Ht '5\' 6"'$  (1.676 m)   Wt 197 lb 3.2 oz (89.4 kg)   BMI 31.83 kg/m      Assessment & Plan:  1. Hypoglycemia - CMP14+EGFR  2. Constipation, unspecified constipation type Force fluids - CMP14+EGFR - polyethylene glycol powder (GLYCOLAX/MIRALAX) powder; Take 17 g by mouth 2 (two) times daily as needed.  Dispense: 3350 g; Refill: 1  3. Chronic bronchitis, unspecified chronic bronchitis type ([place]) Smoking cessation - CMP14+EGFR - DME Nebulizer machine - fluticasone furoate-vilanterol (BREO ELLIPTA) 100-25 MCG/INH AEPB; Inhale 1 puff into the lungs daily.  Dispense: 1 each; Refill: 3   Continue all meds Labs pending Health Maintenance reviewed Diet and exercise encouraged RTO keep follow up  Evelina Dun, FNP

## 2017-08-07 ENCOUNTER — Telehealth: Payer: Self-pay

## 2017-08-07 LAB — CMP14+EGFR
ALBUMIN: 4.2 g/dL (ref 3.5–5.5)
ALT: 86 IU/L — ABNORMAL HIGH (ref 0–32)
AST: 56 IU/L — ABNORMAL HIGH (ref 0–40)
Albumin/Globulin Ratio: 1.4 (ref 1.2–2.2)
Alkaline Phosphatase: 85 IU/L (ref 39–117)
BUN / CREAT RATIO: 22 (ref 9–23)
BUN: 14 mg/dL (ref 6–24)
Bilirubin Total: 0.2 mg/dL (ref 0.0–1.2)
CALCIUM: 9.5 mg/dL (ref 8.7–10.2)
CO2: 21 mmol/L (ref 20–29)
CREATININE: 0.64 mg/dL (ref 0.57–1.00)
Chloride: 104 mmol/L (ref 96–106)
GFR calc Af Amer: 119 mL/min/{1.73_m2} (ref >59)
GFR, EST NON AFRICAN AMERICAN: 104 mL/min/{1.73_m2} (ref >59)
GLOBULIN, TOTAL: 2.9 g/dL (ref 1.5–4.5)
Glucose: 70 mg/dL (ref 65–99)
Potassium: 4.6 mmol/L (ref 3.5–5.2)
SODIUM: 140 mmol/L (ref 134–144)
Total Protein: 7.1 g/dL (ref 6.0–8.5)

## 2017-08-07 NOTE — Telephone Encounter (Signed)
[facility] - left message.  

## 2017-08-18 ENCOUNTER — Ambulatory Visit
Admission: RE | Admit: 2017-08-18 | Discharge: 2017-08-18 | Disposition: A | Discharge status: Home / Self Care | Payer: Medicaid Other | Source: Ambulatory Visit | Attending: Sports Medicine | Admitting: Sports Medicine

## 2017-08-18 ENCOUNTER — Other Ambulatory Visit: Payer: Medicaid Other

## 2017-08-18 ENCOUNTER — Telehealth (INDEPENDENT_AMBULATORY_CARE_PROVIDER_SITE_OTHER): Payer: Self-pay | Admitting: *Deleted

## 2017-08-18 ENCOUNTER — Telehealth: Payer: Self-pay | Admitting: *Deleted

## 2017-08-18 DIAGNOSIS — R748 Abnormal levels of other serum enzymes: Secondary | ICD-10-CM

## 2017-08-18 DIAGNOSIS — M5416 Radiculopathy, lumbar region: Secondary | ICD-10-CM

## 2017-08-18 MED ORDER — IOPAMIDOL (ISOVUE-M 200) INJECTION 41%
1.0000 mL | Freq: Once | INTRAMUSCULAR | Status: AC
  Administered 2017-08-18: 1 mL via EPIDURAL

## 2017-08-18 MED ORDER — METHYLPREDNISOLONE ACETATE 40 MG/ML INJ SUSP (RADIOLOG
120.0000 mg | Freq: Once | INTRAMUSCULAR | Status: AC
  Administered 2017-08-18: 120 mg via EPIDURAL

## 2017-08-18 NOTE — Patient Instructions (Signed)
Rachael Watson  08/18/2017     @PREFPERIOPPHARMACY @   Your procedure is scheduled on  08/27/2017   Report to [facility] at  1045  A.M.  Call this number if you have problems the morning of surgery:  [phone]   Remember:  Do not eat food or drink liquids after midnight.  Take these medicines the morning of surgery with A SIP OF WATER  Celexa, flexaril, hydrocodone, levothyroxine, ativan, antivert, prilosec. Use your nebulizer and your inhalers before you come.   Do not wear jewelry, make-up or nail polish.  Do not wear lotions, powders, or perfumes, or deodorant.  Do not shave 48 hours prior to surgery.  Men may shave face and neck.  Do not bring valuables to the hospital.  [facility] is not responsible for any belongings or valuables.  Contacts, dentures or bridgework may not be worn into surgery.  Leave your suitcase in the car.  After surgery it may be brought to your room.  For patients admitted to the hospital, discharge time will be determined by your treatment team.  Patients discharged the day of surgery will not be allowed to drive home.   Name and phone number of your driver:   family Special instructions:  Follow the diet and prep instructions given to you by Dr Olevia Perches office.  Please read over the following fact sheets that you were given. Anesthesia Post-op Instructions and Care and Recovery After Surgery       Colonoscopy, Adult A colonoscopy is an exam to look at the large intestine. It is done to check for problems, such as:  Lumps (tumors).  Growths (polyps).  Swelling (inflammation).  Bleeding.  What happens before the procedure? Eating and drinking Follow instructions from your doctor about eating and drinking. These instructions may include:  A few days before the procedure - follow a low-fiber diet. ? Avoid nuts. ? Avoid seeds. ? Avoid dried fruit. ? Avoid raw fruits. ? Avoid vegetables.  1-3 days before the  procedure - follow a clear liquid diet. Avoid liquids that have red or purple dye. Drink only clear liquids, such as: ? Clear broth or bouillon. ? Black coffee or tea. ? Clear juice. ? Clear soft drinks or sports drinks. ? Gelatin dessert. ? Popsicles.  On the day of the procedure - do not eat or drink anything during the 2 hours before the procedure.  Bowel prep If you were prescribed an oral bowel prep:  Take it as told by your doctor. Starting the day before your procedure, you will need to drink a lot of liquid. The liquid will cause you to poop (have bowel movements) until your poop is almost clear or light green.  If your skin or butt gets irritated from diarrhea, you may: ? Wipe the area with wipes that have medicine in them, such as adult wet wipes with aloe and vitamin E. ? Put something on your skin that soothes the area, such as petroleum jelly.  If you throw up (vomit) while drinking the bowel prep, take a break for up to 60 minutes. Then begin the bowel prep again. If you keep throwing up and you cannot take the bowel prep without throwing up, call your doctor.  General instructions  Ask your doctor about changing or stopping your normal medicines. This is important if you take diabetes medicines or blood thinners.  Plan to have someone take you home from the hospital  or clinic. What happens during the procedure?  An IV tube may be put into one of your veins.  You will be given medicine to help you relax (sedative).  To reduce your risk of infection: ? Your doctors will wash their hands. ? Your anal area will be washed with soap.  You will be asked to lie on your side with your knees bent.  Your doctor will get a long, thin, flexible tube ready. The tube will have a camera and a light on the end.  The tube will be put into your anus.  The tube will be gently put into your large intestine.  Air will be delivered into your large intestine to keep it open. You  may feel some pressure or cramping.  The camera will be used to take photos.  A small tissue sample may be removed from your body to be looked at under a microscope (biopsy). If any possible problems are found, the tissue will be sent to a lab for testing.  If small growths are found, your doctor may remove them and have them checked for cancer.  The tube that was put into your anus will be slowly removed. The procedure may vary among doctors and hospitals. What happens after the procedure?  Your doctor will check on you often until the medicines you were given have worn off.  Do not drive for 24 hours after the procedure.  You may have a small amount of blood in your poop.  You may pass gas.  You may have mild cramps or bloating in your belly (abdomen).  It is up to you to get the results of your procedure. Ask your doctor, or the department performing the procedure, when your results will be ready. This information is not intended to replace advice given to you by your health care provider. Make sure you discuss any questions you have with your health care provider. Document Released: 07/25/2010 Document Revised: 04/22/2016 Document Reviewed: 09/03/2015 Elsevier Interactive Patient Education  2017 [employer]  Colonoscopy, Adult, Care After This sheet gives you information about how to care for yourself after your procedure. Your health care provider may also give you more specific instructions. If you have problems or questions, contact your health care provider. What can I expect after the procedure? After the procedure, it is common to have:  A small amount of blood in your stool for 24 hours after the procedure.  Some gas.  Mild abdominal cramping or bloating.  Follow these instructions at home: General instructions   For the first 24 hours after the procedure: ? Do not drive or use machinery. ? Do not sign important documents. ? Do not drink alcohol. ? Do your  regular daily activities at a slower pace than normal. ? Eat soft, easy-to-digest foods. ? Rest often.  Take over-the-counter or prescription medicines only as told by your health care provider.  It is up to you to get the results of your procedure. Ask your health care provider, or the department performing the procedure, when your results will be ready. Relieving cramping and bloating  Try walking around when you have cramps or feel bloated.  Apply heat to your abdomen as told by your health care provider. Use a heat source that your health care provider recommends, such as a moist heat pack or a heating pad. ? Place a towel between your skin and the heat source. ? Leave the heat on for 20-30 minutes. ? Remove the heat  if your skin turns bright red. This is especially important if you are unable to feel pain, heat, or cold. You may have a greater risk of getting burned. Eating and drinking  Drink enough fluid to keep your urine clear or pale yellow.  Resume your normal diet as instructed by your health care provider. Avoid heavy or fried foods that are hard to digest.  Avoid drinking alcohol for as long as instructed by your health care provider. Contact a health care provider if:  You have blood in your stool 2-3 days after the procedure. Get help right away if:  You have more than a small spotting of blood in your stool.  You pass large blood clots in your stool.  Your abdomen is swollen.  You have nausea or vomiting.  You have a fever.  You have increasing abdominal pain that is not relieved with medicine. This information is not intended to replace advice given to you by your health care provider. Make sure you discuss any questions you have with your health care provider. Document Released: 02/04/2004 Document Revised: 03/16/2016 Document Reviewed: 09/03/2015 Elsevier Interactive Patient Education  2018 [place] Anesthesia is a term  that refers to techniques, procedures, and medicines that help a person stay safe and comfortable during a medical procedure. Monitored anesthesia care, or sedation, is one type of anesthesia. Your anesthesia specialist may recommend sedation if you will be having a procedure that does not require you to be unconscious, such as:  Cataract surgery.  A dental procedure.  A biopsy.  A colonoscopy.  During the procedure, you may receive a medicine to help you relax (sedative). There are three levels of sedation:  Mild sedation. At this level, you may feel awake and relaxed. You will be able to follow directions.  Moderate sedation. At this level, you will be sleepy. You may not remember the procedure.  Deep sedation. At this level, you will be asleep. You will not remember the procedure.  The more medicine you are given, the deeper your level of sedation will be. Depending on how you respond to the procedure, the anesthesia specialist may change your level of sedation or the type of anesthesia to fit your needs. An anesthesia specialist will monitor you closely during the procedure. Let your health care provider know about:  Any allergies you have.  All medicines you are taking, including vitamins, herbs, eye drops, creams, and over-the-counter medicines.  Any use of steroids (by mouth or as a cream).  Any problems you or family members have had with sedatives and anesthetic medicines.  Any blood disorders you have.  Any surgeries you have had.  Any medical conditions you have, such as sleep apnea.  Whether you are pregnant or may be pregnant.  Any use of cigarettes, alcohol, or street drugs. What are the risks? Generally, this is a safe procedure. However, problems may occur, including:  Getting too much medicine (oversedation).  Nausea.  Allergic reaction to medicines.  Trouble breathing. If this happens, a breathing tube may be used to help with breathing. It will be  removed when you are awake and breathing on your own.  Heart trouble.  Lung trouble.  Before the procedure Staying hydrated Follow instructions from your health care provider about hydration, which may include:  Up to 2 hours before the procedure - you may continue to drink clear liquids, such as water, clear fruit juice, black coffee, and plain tea.  Eating and drinking  restrictions Follow instructions from your health care provider about eating and drinking, which may include:  8 hours before the procedure - stop eating heavy meals or foods such as meat, fried foods, or fatty foods.  6 hours before the procedure - stop eating light meals or foods, such as toast or cereal.  6 hours before the procedure - stop drinking milk or drinks that contain milk.  2 hours before the procedure - stop drinking clear liquids.  Medicines Ask your health care provider about:  Changing or stopping your regular medicines. This is especially important if you are taking diabetes medicines or blood thinners.  Taking medicines such as aspirin and ibuprofen. These medicines can thin your blood. Do not take these medicines before your procedure if your health care provider instructs you not to.  Tests and exams  You will have a physical exam.  You may have blood tests done to show: ? How well your kidneys and liver are working. ? How well your blood can clot.  General instructions  Plan to have someone take you home from the hospital or clinic.  If you will be going home right after the procedure, plan to have someone with you for 24 hours.  What happens during the procedure?  Your blood pressure, heart rate, breathing, level of pain and overall condition will be monitored.  An IV tube will be inserted into one of your veins.  Your anesthesia specialist will give you medicines as needed to keep you comfortable during the procedure. This may mean changing the level of sedation.  The  procedure will be performed. After the procedure  Your blood pressure, heart rate, breathing rate, and blood oxygen level will be monitored until the medicines you were given have worn off.  Do not drive for 24 hours if you received a sedative.  You may: ? Feel sleepy, clumsy, or nauseous. ? Feel forgetful about what happened after the procedure. ? Have a sore throat if you had a breathing tube during the procedure. ? Vomit. This information is not intended to replace advice given to you by your health care provider. Make sure you discuss any questions you have with your health care provider. Document Released: 03/18/2005 Document Revised: 11/29/2015 Document Reviewed: 10/13/2015 Elsevier Interactive Patient Education  2018 [place], Care After These instructions provide you with information about caring for yourself after your procedure. Your health care provider may also give you more specific instructions. Your treatment has been planned according to current medical practices, but problems sometimes occur. Call your health care provider if you have any problems or questions after your procedure. What can I expect after the procedure? After your procedure, it is common to:  Feel sleepy for several hours.  Feel clumsy and have poor balance for several hours.  Feel forgetful about what happened after the procedure.  Have poor judgment for several hours.  Feel nauseous or vomit.  Have a sore throat if you had a breathing tube during the procedure.  Follow these instructions at home: For at least 24 hours after the procedure:   Do not: ? Participate in activities in which you could fall or become injured. ? Drive. ? Use heavy machinery. ? Drink alcohol. ? Take sleeping pills or medicines that cause drowsiness. ? Make important decisions or sign legal documents. ? Take care of children on your own.  Rest. Eating and drinking  Follow the  diet that is recommended by your health care  provider.  If you vomit, drink water, juice, or soup when you can drink without vomiting.  Make sure you have little or no nausea before eating solid foods. General instructions  Have a responsible adult stay with you until you are awake and alert.  Take over-the-counter and prescription medicines only as told by your health care provider.  If you smoke, do not smoke without supervision.  Keep all follow-up visits as told by your health care provider. This is important. Contact a health care provider if:  You keep feeling nauseous or you keep vomiting.  You feel light-headed.  You develop a rash.  You have a fever. Get help right away if:  You have trouble breathing. This information is not intended to replace advice given to you by your health care provider. Make sure you discuss any questions you have with your health care provider. Document Released: 10/13/2015 Document Revised: 02/12/2016 Document Reviewed: 10/13/2015 Elsevier Interactive Patient Education  [employer].

## 2017-08-18 NOTE — Telephone Encounter (Signed)
Pt called and will order abdomen US

## 2017-08-18 NOTE — Telephone Encounter (Signed)
This morning Rachael Watson from [facility] scheduling said she had called and would like to have her TCS sooner due to elevated liver enxzymes.  I checked and there are no available propofol this Friday.  Unless I do not know of any issues--elevated liver enzymes for screening is not urgernt--otherwise she may need to make an appt for that

## 2017-08-18 NOTE — Discharge Instructions (Signed)

## 2017-08-18 NOTE — Addendum Note (Signed)
Addended by: Evelina Dun A on: 08/18/2017 04:15 PM   Modules accepted: Orders

## 2017-08-18 NOTE — Telephone Encounter (Signed)
Patient called upset that her liver enzymes have doubled and we did not notify her. She said her psychiatrist informed her that her liver enzymes had doubled since December. She wants to know why we did not inform her of this.

## 2017-08-19 ENCOUNTER — Telehealth: Payer: Self-pay | Admitting: Podiatry

## 2017-08-19 ENCOUNTER — Other Ambulatory Visit: Payer: Self-pay | Admitting: Family

## 2017-08-19 ENCOUNTER — Encounter (HOSPITAL_COMMUNITY)
Admission: RE | Admit: 2017-08-19 | Discharge: 2017-08-19 | Disposition: A | Discharge status: Home / Self Care | Payer: Medicaid Other | Source: Ambulatory Visit | Attending: Internal Medicine | Admitting: Internal Medicine

## 2017-08-19 ENCOUNTER — Telehealth: Payer: Self-pay

## 2017-08-19 MED ORDER — GLUCOSE BLOOD VI STRP: ORAL_STRIP | 0 refills | Status: DC

## 2017-08-19 NOTE — Telephone Encounter (Signed)
LMOVM that test strips have been sent to pharmacy

## 2017-08-19 NOTE — Telephone Encounter (Signed)
Talked with the patient. She states that Evelina Dun has ordered a stat ultrasound to look at her liver. She has recently moved here from [place]. She has told caregivers there that something was wrong , because of her discomfort. Since being here she has found out that her LFT's are elevated , and has been told that her LIVER is 5 times larger than it should be.  Patient states that she is going to precede with the Colonoscopy as planned next week.

## 2017-08-19 NOTE — Telephone Encounter (Signed)
Pt left voicemail stating she needs a new rx for diabetic shoes and inserts since [facility] no longer does them. She has a pharmacy in [place] that does them and they will not take rx with [facility] on it.  Can you please writer her a rx on a regular form for this.   I contacted pt and let her know you were out of the office until next week and pt said she will pick up on 2.26.19 when she is in the area for another appt.

## 2017-08-19 NOTE — Telephone Encounter (Signed)
[facility] - left message.  

## 2017-08-20 ENCOUNTER — Other Ambulatory Visit (HOSPITAL_COMMUNITY): Payer: Medicaid Other

## 2017-08-24 ENCOUNTER — Telehealth: Payer: Self-pay | Admitting: Podiatry

## 2017-08-24 NOTE — Telephone Encounter (Signed)
Gave to [employer]

## 2017-08-24 NOTE — Telephone Encounter (Signed)
Called pt per Dr. Leigh Aurora request to let her know that he had written her a prescription for diabetic shoes and inserts. I asked the pt if she wanted to pick them up or wanted [place] to mail it to her. Pt stated she would be here on 26 February to pick up the prescription. I told her I would have it at the front where check in is for her.

## 2017-08-24 NOTE — Patient Instructions (Signed)
Rachael Watson  08/24/2017     @PREFPERIOPPHARMACY @   Your procedure is scheduled on  08/27/2017 .  Report to [facility] at  915   A.M.  Call this number if you have problems the morning of surgery:  [phone]   Remember:  Do not eat food or drink liquids after midnight.  Take these medicines the morning of surgery with A SIP OF WATER  Celexa, flexaril, hydrocodone, levothyroxine, ativan, antivert, prilosec. Use your nebulizer and your inhalers before you come.   Do not wear jewelry, make-up or nail polish.  Do not wear lotions, powders, or perfumes, or deodorant.  Do not shave 48 hours prior to surgery.  Men may shave face and neck.  Do not bring valuables to the hospital.  [facility] is not responsible for any belongings or valuables.  Contacts, dentures or bridgework may not be worn into surgery.  Leave your suitcase in the car.  After surgery it may be brought to your room.  For patients admitted to the hospital, discharge time will be determined by your treatment team.  Patients discharged the day of surgery will not be allowed to drive home.   Name and phone number of your driver:   family Special instructions:  Follow the diet and prep instructions given to you by Dr Olevia Perches office.  Please read over the following fact sheets that you were given. Anesthesia Post-op Instructions and Care and Recovery After Surgery       Colonoscopy, Adult A colonoscopy is an exam to look at the large intestine. It is done to check for problems, such as:  Lumps (tumors).  Growths (polyps).  Swelling (inflammation).  Bleeding.  What happens before the procedure? Eating and drinking Follow instructions from your doctor about eating and drinking. These instructions may include:  A few days before the procedure - follow a low-fiber diet. ? Avoid nuts. ? Avoid seeds. ? Avoid dried fruit. ? Avoid raw fruits. ? Avoid vegetables.  1-3 days before the  procedure - follow a clear liquid diet. Avoid liquids that have red or purple dye. Drink only clear liquids, such as: ? Clear broth or bouillon. ? Black coffee or tea. ? Clear juice. ? Clear soft drinks or sports drinks. ? Gelatin dessert. ? Popsicles.  On the day of the procedure - do not eat or drink anything during the 2 hours before the procedure.  Bowel prep If you were prescribed an oral bowel prep:  Take it as told by your doctor. Starting the day before your procedure, you will need to drink a lot of liquid. The liquid will cause you to poop (have bowel movements) until your poop is almost clear or light green.  If your skin or butt gets irritated from diarrhea, you may: ? Wipe the area with wipes that have medicine in them, such as adult wet wipes with aloe and vitamin E. ? Put something on your skin that soothes the area, such as petroleum jelly.  If you throw up (vomit) while drinking the bowel prep, take a break for up to 60 minutes. Then begin the bowel prep again. If you keep throwing up and you cannot take the bowel prep without throwing up, call your doctor.  General instructions  Ask your doctor about changing or stopping your normal medicines. This is important if you take diabetes medicines or blood thinners.  Plan to have someone take you home from the  hospital or clinic. What happens during the procedure?  An IV tube may be put into one of your veins.  You will be given medicine to help you relax (sedative).  To reduce your risk of infection: ? Your doctors will wash their hands. ? Your anal area will be washed with soap.  You will be asked to lie on your side with your knees bent.  Your doctor will get a long, thin, flexible tube ready. The tube will have a camera and a light on the end.  The tube will be put into your anus.  The tube will be gently put into your large intestine.  Air will be delivered into your large intestine to keep it open. You  may feel some pressure or cramping.  The camera will be used to take photos.  A small tissue sample may be removed from your body to be looked at under a microscope (biopsy). If any possible problems are found, the tissue will be sent to a lab for testing.  If small growths are found, your doctor may remove them and have them checked for cancer.  The tube that was put into your anus will be slowly removed. The procedure may vary among doctors and hospitals. What happens after the procedure?  Your doctor will check on you often until the medicines you were given have worn off.  Do not drive for 24 hours after the procedure.  You may have a small amount of blood in your poop.  You may pass gas.  You may have mild cramps or bloating in your belly (abdomen).  It is up to you to get the results of your procedure. Ask your doctor, or the department performing the procedure, when your results will be ready. This information is not intended to replace advice given to you by your health care provider. Make sure you discuss any questions you have with your health care provider. Document Released: 07/25/2010 Document Revised: 04/22/2016 Document Reviewed: 09/03/2015 Elsevier Interactive Patient Education  2017 [employer]  Colonoscopy, Adult, Care After This sheet gives you information about how to care for yourself after your procedure. Your health care provider may also give you more specific instructions. If you have problems or questions, contact your health care provider. What can I expect after the procedure? After the procedure, it is common to have:  A small amount of blood in your stool for 24 hours after the procedure.  Some gas.  Mild abdominal cramping or bloating.  Follow these instructions at home: General instructions   For the first 24 hours after the procedure: ? Do not drive or use machinery. ? Do not sign important documents. ? Do not drink alcohol. ? Do your  regular daily activities at a slower pace than normal. ? Eat soft, easy-to-digest foods. ? Rest often.  Take over-the-counter or prescription medicines only as told by your health care provider.  It is up to you to get the results of your procedure. Ask your health care provider, or the department performing the procedure, when your results will be ready. Relieving cramping and bloating  Try walking around when you have cramps or feel bloated.  Apply heat to your abdomen as told by your health care provider. Use a heat source that your health care provider recommends, such as a moist heat pack or a heating pad. ? Place a towel between your skin and the heat source. ? Leave the heat on for 20-30 minutes. ? Remove the  heat if your skin turns bright red. This is especially important if you are unable to feel pain, heat, or cold. You may have a greater risk of getting burned. Eating and drinking  Drink enough fluid to keep your urine clear or pale yellow.  Resume your normal diet as instructed by your health care provider. Avoid heavy or fried foods that are hard to digest.  Avoid drinking alcohol for as long as instructed by your health care provider. Contact a health care provider if:  You have blood in your stool 2-3 days after the procedure. Get help right away if:  You have more than a small spotting of blood in your stool.  You pass large blood clots in your stool.  Your abdomen is swollen.  You have nausea or vomiting.  You have a fever.  You have increasing abdominal pain that is not relieved with medicine. This information is not intended to replace advice given to you by your health care provider. Make sure you discuss any questions you have with your health care provider. Document Released: 02/04/2004 Document Revised: 03/16/2016 Document Reviewed: 09/03/2015 Elsevier Interactive Patient Education  2018 [place] Anesthesia is a term  that refers to techniques, procedures, and medicines that help a person stay safe and comfortable during a medical procedure. Monitored anesthesia care, or sedation, is one type of anesthesia. Your anesthesia specialist may recommend sedation if you will be having a procedure that does not require you to be unconscious, such as:  Cataract surgery.  A dental procedure.  A biopsy.  A colonoscopy.  During the procedure, you may receive a medicine to help you relax (sedative). There are three levels of sedation:  Mild sedation. At this level, you may feel awake and relaxed. You will be able to follow directions.  Moderate sedation. At this level, you will be sleepy. You may not remember the procedure.  Deep sedation. At this level, you will be asleep. You will not remember the procedure.  The more medicine you are given, the deeper your level of sedation will be. Depending on how you respond to the procedure, the anesthesia specialist may change your level of sedation or the type of anesthesia to fit your needs. An anesthesia specialist will monitor you closely during the procedure. Let your health care provider know about:  Any allergies you have.  All medicines you are taking, including vitamins, herbs, eye drops, creams, and over-the-counter medicines.  Any use of steroids (by mouth or as a cream).  Any problems you or family members have had with sedatives and anesthetic medicines.  Any blood disorders you have.  Any surgeries you have had.  Any medical conditions you have, such as sleep apnea.  Whether you are pregnant or may be pregnant.  Any use of cigarettes, alcohol, or street drugs. What are the risks? Generally, this is a safe procedure. However, problems may occur, including:  Getting too much medicine (oversedation).  Nausea.  Allergic reaction to medicines.  Trouble breathing. If this happens, a breathing tube may be used to help with breathing. It will be  removed when you are awake and breathing on your own.  Heart trouble.  Lung trouble.  Before the procedure Staying hydrated Follow instructions from your health care provider about hydration, which may include:  Up to 2 hours before the procedure - you may continue to drink clear liquids, such as water, clear fruit juice, black coffee, and plain tea.  Eating and  drinking restrictions Follow instructions from your health care provider about eating and drinking, which may include:  8 hours before the procedure - stop eating heavy meals or foods such as meat, fried foods, or fatty foods.  6 hours before the procedure - stop eating light meals or foods, such as toast or cereal.  6 hours before the procedure - stop drinking milk or drinks that contain milk.  2 hours before the procedure - stop drinking clear liquids.  Medicines Ask your health care provider about:  Changing or stopping your regular medicines. This is especially important if you are taking diabetes medicines or blood thinners.  Taking medicines such as aspirin and ibuprofen. These medicines can thin your blood. Do not take these medicines before your procedure if your health care provider instructs you not to.  Tests and exams  You will have a physical exam.  You may have blood tests done to show: ? How well your kidneys and liver are working. ? How well your blood can clot.  General instructions  Plan to have someone take you home from the hospital or clinic.  If you will be going home right after the procedure, plan to have someone with you for 24 hours.  What happens during the procedure?  Your blood pressure, heart rate, breathing, level of pain and overall condition will be monitored.  An IV tube will be inserted into one of your veins.  Your anesthesia specialist will give you medicines as needed to keep you comfortable during the procedure. This may mean changing the level of sedation.  The  procedure will be performed. After the procedure  Your blood pressure, heart rate, breathing rate, and blood oxygen level will be monitored until the medicines you were given have worn off.  Do not drive for 24 hours if you received a sedative.  You may: ? Feel sleepy, clumsy, or nauseous. ? Feel forgetful about what happened after the procedure. ? Have a sore throat if you had a breathing tube during the procedure. ? Vomit. This information is not intended to replace advice given to you by your health care provider. Make sure you discuss any questions you have with your health care provider. Document Released: 03/18/2005 Document Revised: 11/29/2015 Document Reviewed: 10/13/2015 Elsevier Interactive Patient Education  2018 [place], Care After These instructions provide you with information about caring for yourself after your procedure. Your health care provider may also give you more specific instructions. Your treatment has been planned according to current medical practices, but problems sometimes occur. Call your health care provider if you have any problems or questions after your procedure. What can I expect after the procedure? After your procedure, it is common to:  Feel sleepy for several hours.  Feel clumsy and have poor balance for several hours.  Feel forgetful about what happened after the procedure.  Have poor judgment for several hours.  Feel nauseous or vomit.  Have a sore throat if you had a breathing tube during the procedure.  Follow these instructions at home: For at least 24 hours after the procedure:   Do not: ? Participate in activities in which you could fall or become injured. ? Drive. ? Use heavy machinery. ? Drink alcohol. ? Take sleeping pills or medicines that cause drowsiness. ? Make important decisions or sign legal documents. ? Take care of children on your own.  Rest. Eating and drinking  Follow the  diet that is recommended by your health  care provider.  If you vomit, drink water, juice, or soup when you can drink without vomiting.  Make sure you have little or no nausea before eating solid foods. General instructions  Have a responsible adult stay with you until you are awake and alert.  Take over-the-counter and prescription medicines only as told by your health care provider.  If you smoke, do not smoke without supervision.  Keep all follow-up visits as told by your health care provider. This is important. Contact a health care provider if:  You keep feeling nauseous or you keep vomiting.  You feel light-headed.  You develop a rash.  You have a fever. Get help right away if:  You have trouble breathing. This information is not intended to replace advice given to you by your health care provider. Make sure you discuss any questions you have with your health care provider. Document Released: 10/13/2015 Document Revised: 02/12/2016 Document Reviewed: 10/13/2015 Elsevier Interactive Patient Education  [employer].

## 2017-08-25 ENCOUNTER — Encounter (HOSPITAL_COMMUNITY)
Admission: RE | Admit: 2017-08-25 | Discharge: 2017-08-25 | Disposition: A | Discharge status: Home / Self Care | Payer: Medicaid Other | Source: Ambulatory Visit | Attending: Internal Medicine | Admitting: Internal Medicine

## 2017-08-25 ENCOUNTER — Encounter (HOSPITAL_COMMUNITY): Payer: Self-pay

## 2017-08-25 DIAGNOSIS — K219 Gastro-esophageal reflux disease without esophagitis: Secondary | ICD-10-CM | POA: Insufficient documentation

## 2017-08-25 DIAGNOSIS — I509 Heart failure, unspecified: Secondary | ICD-10-CM | POA: Insufficient documentation

## 2017-08-25 DIAGNOSIS — F329 Major depressive disorder, single episode, unspecified: Secondary | ICD-10-CM | POA: Insufficient documentation

## 2017-08-25 DIAGNOSIS — F419 Anxiety disorder, unspecified: Secondary | ICD-10-CM | POA: Insufficient documentation

## 2017-08-25 DIAGNOSIS — Z800 Family history of malignant neoplasm of digestive organs: Secondary | ICD-10-CM

## 2017-08-25 DIAGNOSIS — E119 Type 2 diabetes mellitus without complications: Secondary | ICD-10-CM | POA: Insufficient documentation

## 2017-08-25 DIAGNOSIS — Z01812 Encounter for preprocedural laboratory examination: Secondary | ICD-10-CM | POA: Insufficient documentation

## 2017-08-25 DIAGNOSIS — Z1211 Encounter for screening for malignant neoplasm of colon: Secondary | ICD-10-CM

## 2017-08-25 HISTORY — DX: Heart failure, unspecified: I50.9

## 2017-08-25 HISTORY — DX: Type 2 diabetes mellitus without complications: E11.9

## 2017-08-25 LAB — CBC WITH DIFFERENTIAL/PLATELET
Basophils Absolute: 0 10*3/uL (ref 0.0–0.1)
Basophils Relative: 0 %
EOS PCT: 1 %
Eosinophils Absolute: 0.1 10*3/uL (ref 0.0–0.7)
HEMATOCRIT: 43 % (ref 36.0–46.0)
HEMOGLOBIN: 13.8 g/dL (ref 12.0–15.0)
LYMPHS PCT: 24 %
Lymphs Abs: 3.4 10*3/uL (ref 0.7–4.0)
MCH: 29.6 pg (ref 26.0–34.0)
MCHC: 32.1 g/dL (ref 30.0–36.0)
MCV: 92.1 fL (ref 78.0–100.0)
MONOS PCT: 7 %
Monocytes Absolute: 1 10*3/uL (ref 0.1–1.0)
NEUTROS ABS: 9.6 10*3/uL — AB (ref 1.7–7.7)
Neutrophils Relative %: 68 %
Platelets: 277 10*3/uL (ref 150–400)
RBC: 4.67 MIL/uL (ref 3.87–5.11)
RDW: 13.1 % (ref 11.5–15.5)
WBC: 14.1 10*3/uL — ABNORMAL HIGH (ref 4.0–10.5)

## 2017-08-27 ENCOUNTER — Encounter (HOSPITAL_COMMUNITY): Payer: Self-pay | Admitting: *Deleted

## 2017-08-27 ENCOUNTER — Ambulatory Visit (HOSPITAL_COMMUNITY)
Admission: RE | Admit: 2017-08-27 | Discharge: 2017-08-27 | Disposition: A | Discharge status: Home / Self Care | Payer: Medicaid Other | Source: Ambulatory Visit | Attending: Internal Medicine | Admitting: Internal Medicine

## 2017-08-27 ENCOUNTER — Ambulatory Visit (HOSPITAL_COMMUNITY): Payer: Medicaid Other | Admitting: Anesthesiology

## 2017-08-27 ENCOUNTER — Encounter (HOSPITAL_COMMUNITY)
Admission: RE | Disposition: A | Discharge status: Home / Self Care | Payer: Self-pay | Source: Ambulatory Visit | Attending: Internal Medicine

## 2017-08-27 ENCOUNTER — Other Ambulatory Visit: Payer: Self-pay

## 2017-08-27 DIAGNOSIS — K219 Gastro-esophageal reflux disease without esophagitis: Secondary | ICD-10-CM | POA: Insufficient documentation

## 2017-08-27 DIAGNOSIS — Z7951 Long term (current) use of inhaled steroids: Secondary | ICD-10-CM | POA: Insufficient documentation

## 2017-08-27 DIAGNOSIS — F17210 Nicotine dependence, cigarettes, uncomplicated: Secondary | ICD-10-CM | POA: Insufficient documentation

## 2017-08-27 DIAGNOSIS — E079 Disorder of thyroid, unspecified: Secondary | ICD-10-CM | POA: Diagnosis not present

## 2017-08-27 DIAGNOSIS — Z79890 Hormone replacement therapy (postmenopausal): Secondary | ICD-10-CM | POA: Insufficient documentation

## 2017-08-27 DIAGNOSIS — Z79899 Other long term (current) drug therapy: Secondary | ICD-10-CM | POA: Diagnosis not present

## 2017-08-27 DIAGNOSIS — E119 Type 2 diabetes mellitus without complications: Secondary | ICD-10-CM | POA: Diagnosis not present

## 2017-08-27 DIAGNOSIS — Z800 Family history of malignant neoplasm of digestive organs: Secondary | ICD-10-CM

## 2017-08-27 DIAGNOSIS — I509 Heart failure, unspecified: Secondary | ICD-10-CM | POA: Diagnosis not present

## 2017-08-27 DIAGNOSIS — K648 Other hemorrhoids: Secondary | ICD-10-CM | POA: Insufficient documentation

## 2017-08-27 DIAGNOSIS — F419 Anxiety disorder, unspecified: Secondary | ICD-10-CM | POA: Insufficient documentation

## 2017-08-27 DIAGNOSIS — Z7982 Long term (current) use of aspirin: Secondary | ICD-10-CM | POA: Insufficient documentation

## 2017-08-27 DIAGNOSIS — Z1211 Encounter for screening for malignant neoplasm of colon: Secondary | ICD-10-CM | POA: Diagnosis not present

## 2017-08-27 DIAGNOSIS — Q439 Congenital malformation of intestine, unspecified: Secondary | ICD-10-CM | POA: Insufficient documentation

## 2017-08-27 HISTORY — PX: COLONOSCOPY WITH PROPOFOL: SHX5780

## 2017-08-27 LAB — GLUCOSE, CAPILLARY: Glucose-Capillary: 80 mg/dL (ref 65–99)

## 2017-08-27 SURGERY — COLONOSCOPY WITH PROPOFOL
Anesthesia: Monitor Anesthesia Care

## 2017-08-27 MED ORDER — MIDAZOLAM HCL 2 MG/2ML IJ SOLN
1.0000 mg | INTRAMUSCULAR | Status: AC
  Administered 2017-08-27: 2 mg via INTRAVENOUS

## 2017-08-27 MED ORDER — LACTATED RINGERS IV SOLN
INTRAVENOUS | Status: DC | PRN
  Administered 2017-08-27: 10:00:00 via INTRAVENOUS

## 2017-08-27 MED ORDER — MIDAZOLAM HCL 5 MG/5ML IJ SOLN
INTRAMUSCULAR | Status: DC | PRN
Start: 2017-08-27 — End: 2017-08-27
  Administered 2017-08-27: 2 mg via INTRAVENOUS

## 2017-08-27 MED ORDER — EPHEDRINE SULFATE 50 MG/ML IJ SOLN
INTRAMUSCULAR | Status: DC | PRN
  Administered 2017-08-27: 300 mg via INTRAVENOUS

## 2017-08-27 MED ORDER — MIDAZOLAM HCL 2 MG/2ML IJ SOLN
INTRAMUSCULAR | Status: AC
  Filled 2017-08-27: qty 2

## 2017-08-27 MED ORDER — PROPOFOL 500 MG/50ML IV EMUL
INTRAVENOUS | Status: DC | PRN
  Administered 2017-08-27: 11:00:00 via INTRAVENOUS
  Administered 2017-08-27: 125 ug/kg/min via INTRAVENOUS

## 2017-08-27 MED ORDER — PROPOFOL 10 MG/ML IV BOLUS
INTRAVENOUS | Status: AC
  Filled 2017-08-27: qty 20

## 2017-08-27 MED ORDER — FENTANYL CITRATE (PF) 100 MCG/2ML IJ SOLN
INTRAMUSCULAR | Status: AC
  Filled 2017-08-27: qty 2

## 2017-08-27 MED ORDER — FENTANYL CITRATE (PF) 100 MCG/2ML IJ SOLN
25.0000 ug | Freq: Once | INTRAMUSCULAR | Status: AC
  Administered 2017-08-27: 25 ug via INTRAVENOUS

## 2017-08-27 MED ORDER — LACTATED RINGERS IV SOLN
INTRAVENOUS | Status: DC
  Administered 2017-08-27: 11:00:00 via INTRAVENOUS

## 2017-08-27 NOTE — Op Note (Signed)
[facility] Patient Name: Rachael Watson Procedure Date: 08/27/2017 10:24 AM MRN: [id number] Date of Birth: 1966-04-30 Attending MD: Hildred Laser , MD CSN: [id number] Age: 52 Admit Type: Outpatient Procedure:                Colonoscopy Indications:              Screening in patient at increased risk: Family                            history of 1st-degree relative with colorectal                            cancer before age 38 years, Screening in patient at                            increased risk: Colorectal cancer in sister before                            age 43 Providers:                Hildred Laser, MD, Otis Peak B. Sharon Seller, RN, Randa Spike, Technician Referring MD:             Theador Hawthorne. Hawks, NP Medicines:                Propofol per Anesthesia Complications:            No immediate complications. Estimated Blood Loss:     Estimated blood loss: none. Procedure:                Pre-Anesthesia Assessment:                           - Prior to the procedure, a History and Physical                            was performed, and patient medications and                            allergies were reviewed. The patient's tolerance of                            previous anesthesia was also reviewed. The risks                            and benefits of the procedure and the sedation                            options and risks were discussed with the patient.                            All questions were answered, and informed consent  was obtained. Prior Anticoagulants: The patient                            last took aspirin 7 days prior to the procedure.                            ASA Grade Assessment: III - A patient with severe                            systemic disease. After reviewing the risks and                            benefits, the patient was deemed in satisfactory                            condition to undergo  the procedure.                           After obtaining informed consent, the colonoscope                            was passed under direct vision. Throughout the                            procedure, the patient's blood pressure, pulse, and                            oxygen saturations were monitored continuously. The                            [id number] ([id number]) scope was introduced through                            the anus and advanced to the the cecum, identified                            by appendiceal orifice and ileocecal valve. The                            colonoscopy was technically difficult and complex                            due to a tortuous colon. Successful completion of                            the procedure was aided by changing the patient's                            position, using manual pressure and withdrawing and                            reinserting the scope. The patient tolerated the  procedure well. The quality of the bowel                            preparation was adequate. The ileocecal valve,                            appendiceal orifice, and rectum were photographed. Scope In: 10:51:31 AM Scope Out: 11:15:34 AM Scope Withdrawal Time: 0 hours 8 minutes 19 seconds  Total Procedure Duration: 0 hours 24 minutes 3 seconds  Findings:      The perianal and digital rectal examinations were normal.      The colon (entire examined portion) appeared normal.      Internal hemorrhoids were found during retroflexion. The hemorrhoids       were medium-sized. Impression:               - The entire examined colon is normal.                           - Internal hemorrhoids.                           - No specimens collected. Moderate Sedation:      Per Anesthesia Care Recommendation:           - Patient has a contact number available for                            emergencies. The signs and symptoms of potential                             delayed complications were discussed with the                            patient. Return to normal activities tomorrow.                            Written discharge instructions were provided to the                            patient.                           - Resume previous diet today.                           - Continue present medications.                           - Repeat colonoscopy in 5 years for screening                            purposes. Procedure Code(s):        --- Professional ---                           [phone], Colonoscopy, flexible; diagnostic, including  collection of specimen(s) by brushing or washing,                            when performed (separate procedure) Diagnosis Code(s):        --- Professional ---                           K64.8, Other hemorrhoids                           Z80.0, Family history of malignant neoplasm of                            digestive organs CPT copyright 2016 American Medical Association. All rights reserved. The codes documented in this report are preliminary and upon coder review may  be revised to meet current compliance requirements. Hildred Laser, MD Hildred Laser, MD 08/27/2017 11:28:11 AM This report has been signed electronically. Number of Addenda: 0

## 2017-08-27 NOTE — Anesthesia Postprocedure Evaluation (Signed)
Anesthesia Post Note  Patient: Rachael Watson  Procedure(s) Performed: COLONOSCOPY WITH PROPOFOL (N/A )  Patient location during evaluation: PACU Anesthesia Type: MAC Level of consciousness: awake and alert and patient cooperative Pain management: pain level controlled Vital Signs Assessment: post-procedure vital signs reviewed and stable Respiratory status: spontaneous breathing, nonlabored ventilation and respiratory function stable Cardiovascular status: blood pressure returned to baseline Postop Assessment: no apparent nausea or vomiting Anesthetic complications: no     Last Vitals:  Vitals:   08/27/17 1120 08/27/17 1130  BP: (!) 99/45 106/69  Pulse: 87 81  Resp: (!) 22 18  Temp: 36.5 C   SpO2: 100% 97%    Last Pain:  Vitals:   08/27/17 0940  TempSrc: Oral  PainSc: 2                  Donelle Baba J

## 2017-08-27 NOTE — H&P (Signed)
Carmelle Bamberg is an 52 y.o. female.   Chief Complaint: Patient is here for colonoscopy. HPI: Patient is 52 year old Caucasian female who is here for screening colonoscopy.  She states she has half sister on her mother side surgery in her 85s and is doing fine in the 32s.  She denies recent change in her bowel habits or abdominal pain.  She has intermittent hematochezia which she believes is secondary to hemorrhoids.  She has good appetite and has not lost any weight. As above half sister on mother side had surgery for colon carcinoma in her 69s and is doing fine in her 78s.  Patient's mother died of brain cancer at 70.  Past Medical History:  Diagnosis Date  . Anxiety   . CHF (congestive heart failure) ([place])    2009  . Diabetes mellitus without complication ([place])     diet control 1 year ago  . GERD (gastroesophageal reflux disease)   . Thyroid disease     Past Surgical History:  Procedure Laterality Date  . COSMETIC SURGERY    . TUBAL LIGATION      History reviewed. No pertinent family history. Social History:  reports that she has been smoking cigarettes.  She has been smoking about 1.00 pack per day. she has never used smokeless tobacco. She reports that she does not drink alcohol or use drugs.  Allergies:  Allergies  Allergen Reactions  . Abilify [Aripiprazole]     Violent behavior  . Seroquel [Quetiapine]     Tremors and unable to swallow    Medications Prior to Admission  Medication Sig Dispense Refill  . albuterol (PROVENTIL) (2.5 MG/3ML) 0.083% nebulizer solution Take 3 mLs (2.5 mg total) by nebulization every 6 (six) hours as needed for wheezing or shortness of breath. 150 mL 1  . aspirin 325 MG EC tablet Take 325 mg by mouth daily.    . Aspirin-Acetaminophen-Caffeine (GOODY HEADACHE PO) Take 1 packet by mouth daily as needed (headaches).    Marland Kitchen atorvastatin (LIPITOR) 40 MG tablet Take 1 tablet (40 mg total) by mouth daily. 30 tablet 5  . Carboxymethylcellul-Glycerin  (LUBRICATING EYE DROPS OP) Place 1 drop into both eyes daily as needed (dry eyes).    . citalopram (CELEXA) 20 MG tablet Take 20 mg by mouth daily.  2  . cyclobenzaprine (FLEXERIL) 10 MG tablet Take 10 mg by mouth 3 (three) times daily.    . diphenhydrAMINE (BENADRYL) 25 MG tablet Take 50 mg by mouth daily.    . furosemide (LASIX) 20 MG tablet Take 1 tablet (20 mg total) by mouth daily. 30 tablet 6  . HYDROcodone-acetaminophen (NORCO) 10-325 MG tablet Take 1 tablet by mouth 3 times daily  0  . levothyroxine (SYNTHROID, LEVOTHROID) 100 MCG tablet Take 1 tablet (100 mcg total) by mouth daily before breakfast. 90 tablet 1  . LORazepam (ATIVAN) 0.5 MG tablet TAKE 1 TABLET BY MOUTH DAILY AS NEEDED FOR PTSD    . meclizine (ANTIVERT) 12.5 MG tablet TAKE 1 TABLET BY MOUTH THREE TIMES A DAY FOR DIZZINESS 60 tablet 2  . Multiple Vitamin (MULTIVITAMIN WITH MINERALS) TABS tablet Take 1 tablet by mouth daily.    Marland Kitchen omeprazole (PRILOSEC) 20 MG capsule Take 1 capsule (20 mg total) by mouth daily. 90 capsule 1  . polyethylene glycol powder (GLYCOLAX/MIRALAX) powder Take 17 g by mouth 2 (two) times daily as needed. (Patient taking differently: Take 17 g by mouth 2 (two) times daily as needed for moderate constipation. ) 3350 g  1  . PROVENTIL HFA 108 (90 Base) MCG/ACT inhaler USE 2 PUFFS EVERY 6 HOURS AS NEEDED FOR WHEEZING AND SHORTNESS OF BREATH 18 g 1  . baclofen (LIORESAL) 10 MG tablet TAKE 1/2 - 1 TABLET BY MOUTH TWICE A DAY AS NEEDED FOR MUSCLE SPASM (CAN MAKE YOU DROWSY) (Patient not taking: Reported on 08/11/2017) 60 each 3  . blood glucose meter kit and supplies Dispense based on patient and insurance preference. Use up to four times daily as directed. (FOR ICD-10 E10.9, E11.9). 1 each 0  . Fluticasone-Salmeterol (ADVAIR) 100-50 MCG/DOSE AEPB Inhale 1 puff into the lungs 2 (two) times daily. (Patient not taking: Reported on 08/11/2017) 1 each 3  . glucose blood (ACCU-CHEK AVIVA PLUS) test strip Use to check blood  sugars up to 4 times daily 400 each 0    Results for orders placed or performed during the hospital encounter of 08/27/17 (from the past 48 hour(s))  Glucose, capillary     Status: None   Collection Time: 08/27/17  9:33 AM  Result Value Ref Range   Glucose-Capillary 80 65 - 99 mg/dL   No results found.  ROS  Blood pressure 117/63, pulse 78, temperature (!) 97.5 F (36.4 C), temperature source Oral, resp. rate 16, SpO2 98 %. Physical Exam  Constitutional: She appears well-developed and well-nourished.  HENT:  Mouth/Throat: Oropharynx is clear and moist.  Eyes: Conjunctivae are normal. No scleral icterus.  Neck: No thyromegaly present.  Cardiovascular: Normal rate, regular rhythm and normal heart sounds.  No murmur heard. Respiratory: Effort normal and breath sounds normal.  GI:  Abdomen is full.  It is soft and nontender without organomegaly or masses.  Musculoskeletal: She exhibits no edema.  Lymphadenopathy:    She has no cervical adenopathy.  Neurological: She is alert.  Skin: Skin is warm and dry.     Assessment/Plan Screening colonoscopy. Family history of CRC in half sister who was in her 3s at the time of diagnosis.  Hildred Laser, MD 08/27/2017, 10:31 AM

## 2017-08-27 NOTE — Anesthesia Preprocedure Evaluation (Signed)
Anesthesia Evaluation  Patient identified by MRN, date of birth, ID band Patient awake    Reviewed: Allergy & Precautions, NPO status , Patient's Chart, lab work & pertinent test results  Airway Mallampati: I  TM Distance: >3 FB Neck ROM: Full    Dental  (+) Teeth Intact   Pulmonary Current Smoker,    breath sounds clear to auscultation       Cardiovascular +CHF   Rhythm:Regular Rate:Normal     Neuro/Psych PSYCHIATRIC DISORDERS Anxiety Depression    GI/Hepatic GERD  Controlled,  Endo/Other  diabetes, Type 2  Renal/GU      Musculoskeletal   Abdominal   Peds  Hematology   Anesthesia Other Findings   Reproductive/Obstetrics                             Anesthesia Physical Anesthesia Plan  ASA: III  Anesthesia Plan: MAC   Post-op Pain Management:    Induction: Intravenous  PONV Risk Score and Plan:   Airway Management Planned: Simple Face Mask  Additional Equipment:   Intra-op Plan:   Post-operative Plan:   Informed Consent: I have reviewed the patients History and Physical, chart, labs and discussed the procedure including the risks, benefits and alternatives for the proposed anesthesia with the patient or authorized representative who has indicated his/her understanding and acceptance.     Plan Discussed with:   Anesthesia Plan Comments:         Anesthesia Quick Evaluation

## 2017-08-27 NOTE — Discharge Instructions (Signed)
Resume usual medications including aspirin as before. Resume usual diet. No driving for 24 hours. Next colonoscopy in 5 years.   Colonoscopy, Adult, Care After This sheet gives you information about how to care for yourself after your procedure. Your health care provider may also give you more specific instructions. If you have problems or questions, contact your health care provider. What can I expect after the procedure? After the procedure, it is common to have:  A small amount of blood in your stool for 24 hours after the procedure.  Some gas.  Mild abdominal cramping or bloating.  Follow these instructions at home: General instructions   For the first 24 hours after the procedure: ? Do not drive or use machinery. ? Do not sign important documents. ? Do not drink alcohol. ? Do your regular daily activities at a slower pace than normal. ? Eat soft, easy-to-digest foods. ? Rest often.  Take over-the-counter or prescription medicines only as told by your health care provider.  It is up to you to get the results of your procedure. Ask your health care provider, or the department performing the procedure, when your results will be ready. Relieving cramping and bloating  Try walking around when you have cramps or feel bloated.  Apply heat to your abdomen as told by your health care provider. Use a heat source that your health care provider recommends, such as a moist heat pack or a heating pad. ? Place a towel between your skin and the heat source. ? Leave the heat on for 20-30 minutes. ? Remove the heat if your skin turns bright red. This is especially important if you are unable to feel pain, heat, or cold. You may have a greater risk of getting burned. Eating and drinking  Drink enough fluid to keep your urine clear or pale yellow.  Resume your normal diet as instructed by your health care provider. Avoid heavy or fried foods that are hard to digest.  Avoid drinking  alcohol for as long as instructed by your health care provider. Contact a health care provider if:  You have blood in your stool 2-3 days after the procedure. Get help right away if:  You have more than a small spotting of blood in your stool.  You pass large blood clots in your stool.  Your abdomen is swollen.  You have nausea or vomiting.  You have a fever.  You have increasing abdominal pain that is not relieved with medicine. This information is not intended to replace advice given to you by your health care provider. Make sure you discuss any questions you have with your health care provider. Document Released: 02/04/2004 Document Revised: 03/16/2016 Document Reviewed: 09/03/2015 Elsevier Interactive Patient Education  2018 [place] Anesthesia, Adult, Care After These instructions provide you with information about caring for yourself after your procedure. Your health care provider may also give you more specific instructions. Your treatment has been planned according to current medical practices, but problems sometimes occur. Call your health care provider if you have any problems or questions after your procedure. What can I expect after the procedure? After the procedure, it is common to have:  Vomiting.  A sore throat.  Mental slowness.  It is common to feel:  Nauseous.  Cold or shivery.  Sleepy.  Tired.  Sore or achy, even in parts of your body where you did not have surgery.  Follow these instructions at home: For at least 24 hours after  the procedure:  Do not: ? Participate in activities where you could fall or become injured. ? Drive. ? Use heavy machinery. ? Drink alcohol. ? Take sleeping pills or medicines that cause drowsiness. ? Make important decisions or sign legal documents. ? Take care of children on your own.  Rest. Eating and drinking  If you vomit, drink water, juice, or soup when you can drink without  vomiting.  Drink enough fluid to keep your urine clear or pale yellow.  Make sure you have little or no nausea before eating solid foods.  Follow the diet recommended by your health care provider. General instructions  Have a responsible adult stay with you until you are awake and alert.  Return to your normal activities as told by your health care provider. Ask your health care provider what activities are safe for you.  Take over-the-counter and prescription medicines only as told by your health care provider.  If you smoke, do not smoke without supervision.  Keep all follow-up visits as told by your health care provider. This is important. Contact a health care provider if:  You continue to have nausea or vomiting at home, and medicines are not helpful.  You cannot drink fluids or start eating again.  You cannot urinate after 8-12 hours.  You develop a skin rash.  You have fever.  You have increasing redness at the site of your procedure. Get help right away if:  You have difficulty breathing.  You have chest pain.  You have unexpected bleeding.  You feel that you are having a life-threatening or urgent problem. This information is not intended to replace advice given to you by your health care provider. Make sure you discuss any questions you have with your health care provider. Document Released: 09/28/2000 Document Revised: 11/25/2015 Document Reviewed: 06/06/2015 Elsevier Interactive Patient Education  [employer].

## 2017-08-27 NOTE — Transfer of Care (Signed)
Immediate Anesthesia Transfer of Care Note  Patient: [name]  Procedure(s) Performed: COLONOSCOPY WITH PROPOFOL (N/A )  Patient Location: PACU  Anesthesia Type:MAC  Level of Consciousness: awake, alert  and patient cooperative  Airway & Oxygen Therapy: Patient Spontanous Breathing  Post-op Assessment: Report given to RN and Post -op Vital signs reviewed and stable  Post vital signs: Reviewed and stable  Last Vitals:  Vitals:   08/27/17 1020 08/27/17 1025  BP:  117/63  Pulse:    Resp: 18 16  Temp:    SpO2: 98% 98%    Last Pain:  Vitals:   08/27/17 0940  TempSrc: Oral  PainSc: 2       Patients Stated Pain Goal: 4 (21/30/86 5784)  Complications: No apparent anesthesia complications

## 2017-08-30 ENCOUNTER — Encounter (HOSPITAL_COMMUNITY): Payer: Self-pay | Admitting: Internal Medicine

## 2017-08-30 ENCOUNTER — Ambulatory Visit (HOSPITAL_COMMUNITY): Payer: Medicaid Other

## 2017-09-01 ENCOUNTER — Other Ambulatory Visit (HOSPITAL_COMMUNITY): Payer: Medicaid Other

## 2017-09-01 ENCOUNTER — Encounter (HOSPITAL_COMMUNITY): Payer: Self-pay

## 2017-09-03 ENCOUNTER — Ambulatory Visit (HOSPITAL_COMMUNITY)
Admission: RE | Admit: 2017-09-03 | Discharge: 2017-09-03 | Disposition: A | Discharge status: Home / Self Care | Payer: Medicaid Other | Source: Ambulatory Visit | Attending: Family | Admitting: Family

## 2017-09-03 ENCOUNTER — Telehealth: Payer: Self-pay | Admitting: Family

## 2017-09-03 DIAGNOSIS — R748 Abnormal levels of other serum enzymes: Secondary | ICD-10-CM | POA: Diagnosis not present

## 2017-09-03 DIAGNOSIS — K760 Fatty (change of) liver, not elsewhere classified: Secondary | ICD-10-CM | POA: Insufficient documentation

## 2017-09-03 NOTE — Telephone Encounter (Signed)
Please review and advise.

## 2017-09-03 NOTE — Telephone Encounter (Signed)
Ultrasound shows fatty liver disease but otherwise normal

## 2017-09-06 NOTE — Telephone Encounter (Signed)
Patient was informed of ultrasound results.  She reports over the weekend she had abdominal pain and swelling.  She reports she was told by ER and [facility] Department that she does have liver disease.  She would like [place] to refer her to a GI specialist, Dr. Hildred Laser, whom she had seen previously for a colonoscopy.  She would like to be further evaluated by a specialist.  Please advise.

## 2017-09-06 NOTE — Telephone Encounter (Signed)
Patient aware that referral was ordered for GI Dr. Laural Golden and they will call her with appointment information.

## 2017-09-06 NOTE — Telephone Encounter (Signed)
Thank you Glenard Haring!!  I placed the referral under Christy's name.

## 2017-09-06 NOTE — Telephone Encounter (Signed)
This is fine to create the order, should it be in her PCP name? I can sign it if needed.

## 2017-09-07 ENCOUNTER — Telehealth: Payer: Self-pay

## 2017-09-07 NOTE — Telephone Encounter (Signed)
[facility] - left messages on 08-07-17; 08-19-17; 09-02-17 and 09-07-17.  Writer informed the PCP and Dr. Modesta Messing through staff email.

## 2017-09-08 ENCOUNTER — Encounter (INDEPENDENT_AMBULATORY_CARE_PROVIDER_SITE_OTHER): Payer: Self-pay | Admitting: Internal Medicine

## 2017-09-08 ENCOUNTER — Ambulatory Visit (INDEPENDENT_AMBULATORY_CARE_PROVIDER_SITE_OTHER): Payer: Medicaid Other | Admitting: Internal Medicine

## 2017-09-08 VITALS — BP 110/70 | HR 72 | Temp 98.0°F | Ht 66.0 in | Wt 202.6 lb

## 2017-09-08 DIAGNOSIS — R748 Abnormal levels of other serum enzymes: Secondary | ICD-10-CM | POA: Diagnosis not present

## 2017-09-08 NOTE — Patient Instructions (Signed)
Labs. Ov in 6 months

## 2017-09-08 NOTE — Progress Notes (Signed)
Subjective:    Patient ID: Rachael Watson, female    DOB: August 17, 1965, 52 y.o.   MRN: [id number]  HPI Here today for f/u for elevated liver enzymes. Liver enzymes were normal in April 2018.  She says she has tenderness across her abdomen.  Her appetite is okay. No weight loss.  She has had the Hepatitis A and B immunizations.  Has a BM x 1 a weeks. She eats a lot of fiber.  No hx of IV drugs.  She underwent an US abdomen;   IMPRESSION: Mild hepatic steatosis. No hepatic mass or other significant abnormality identified.    Hepatic Function Latest Ref Rng & Units 08/06/2017 07/02/2017  Total Protein 6.0 - 8.5 g/dL 7.1 7.0  Albumin 3.5 - 5.5 g/dL 4.2 4.4  AST 0 - 40 IU/L 56(H) 37  ALT 0 - 32 IU/L 86(H) 56(H)  Alk Phosphatase 39 - 117 IU/L 85 86  Total Bilirubin 0.0 - 1.2 mg/dL 0.2 0.4  Bilirubin, Direct 0.00 - 0.40 mg/dL - 0.07   10/23/2016 ALP 80, AST 16, AL 16.     Review of Systems Past Medical History:  Diagnosis Date  . Anxiety   . CHF (congestive heart failure) ([place])    2009  . Diabetes mellitus without complication ([place])     diet control 1 year ago  . GERD (gastroesophageal reflux disease)   . Thyroid disease     Past Surgical History:  Procedure Laterality Date  . COLONOSCOPY WITH PROPOFOL N/A 08/27/2017   Procedure: COLONOSCOPY WITH PROPOFOL;  Surgeon: Rogene Houston, MD;  Location: [facility];  Service: Endoscopy;  Laterality: N/A;  . COSMETIC SURGERY    . TUBAL LIGATION      Allergies  Allergen Reactions  . Abilify [Aripiprazole]     Violent behavior  . Seroquel [Quetiapine]     Tremors and unable to swallow    Current Outpatient Medications on File Prior to Visit  Medication Sig Dispense Refill  . albuterol (PROVENTIL) (2.5 MG/3ML) 0.083% nebulizer solution Take 3 mLs (2.5 mg total) by nebulization every 6 (six) hours as needed for wheezing or shortness of breath. 150 mL 1  . aspirin 325 MG EC tablet Take 325 mg by mouth daily.    Marland Kitchen  atorvastatin (LIPITOR) 40 MG tablet Take 1 tablet (40 mg total) by mouth daily. 30 tablet 5  . Carboxymethylcellul-Glycerin (LUBRICATING EYE DROPS OP) Place 1 drop into both eyes daily as needed (dry eyes).    . citalopram (CELEXA) 20 MG tablet Take 20 mg by mouth daily.  2  . cyclobenzaprine (FLEXERIL) 10 MG tablet Take 10 mg by mouth 3 (three) times daily.    . diphenhydrAMINE (BENADRYL) 25 MG tablet Take 50 mg by mouth daily.    . furosemide (LASIX) 20 MG tablet Take 1 tablet (20 mg total) by mouth daily. 30 tablet 6  . HYDROcodone-acetaminophen (NORCO) 10-325 MG tablet Take 1 tablet by mouth 3 times daily  0  . levothyroxine (SYNTHROID, LEVOTHROID) 100 MCG tablet Take 1 tablet (100 mcg total) by mouth daily before breakfast. 90 tablet 1  . meclizine (ANTIVERT) 12.5 MG tablet TAKE 1 TABLET BY MOUTH THREE TIMES A DAY FOR DIZZINESS 60 tablet 2  . Multiple Vitamin (MULTIVITAMIN WITH MINERALS) TABS tablet Take 1 tablet by mouth daily.    Marland Kitchen omeprazole (PRILOSEC) 20 MG capsule Take 1 capsule (20 mg total) by mouth daily. 90 capsule 1  . PROVENTIL HFA 108 (90 Base) MCG/ACT inhaler USE  2 PUFFS EVERY 6 HOURS AS NEEDED FOR WHEEZING AND SHORTNESS OF BREATH 18 g 1  . blood glucose meter kit and supplies Dispense based on patient and insurance preference. Use up to four times daily as directed. (FOR ICD-10 E10.9, E11.9). 1 each 0  . glucose blood (ACCU-CHEK AVIVA PLUS) test strip Use to check blood sugars up to 4 times daily 400 each 0   No current facility-administered medications on file prior to visit.         Objective:   Physical Exam Blood pressure 110/70, pulse 72, temperature 98 F (36.7 C), height _0  (1.676 m), weight 202 lb 9.6 oz (91.9 kg). Alert and oriented. Skin warm and dry. Oral mucosa is moist.   . Sclera anicteric, conjunctivae is pink. Thyroid not enlarged. No cervical lymphadenopathy. Lungs clear. Heart regular rate and rhythm.  Abdomen is soft. Bowel sounds are positive. No  hepatomegaly. No abdominal masses felt. No tenderness.  No edema to lower extremities.          Assessment & Plan:  Elevated liver enzymes. Will get labs today. Records from [facility] and the [facility] in [place].  'OV in 6 months.

## 2017-09-09 LAB — HEPATIC FUNCTION PANEL
AG Ratio: 1.4 (calc) (ref 1.0–2.5)
ALBUMIN MSPROF: 3.9 g/dL (ref 3.6–5.1)
ALT: 41 U/L — AB (ref 6–29)
AST: 23 U/L (ref 10–35)
Alkaline phosphatase (APISO): 88 U/L (ref 33–130)
Bilirubin, Direct: 0.1 mg/dL (ref 0.0–0.2)
GLOBULIN: 2.8 g/dL (ref 1.9–3.7)
Indirect Bilirubin: 0.1 mg/dL (calc) — ABNORMAL LOW (ref 0.2–1.2)
TOTAL PROTEIN: 6.7 g/dL (ref 6.1–8.1)
Total Bilirubin: 0.2 mg/dL (ref 0.2–1.2)

## 2017-09-09 LAB — HEPATITIS PANEL, ACUTE
HEP A IGM: NONREACTIVE
Hep B C IgM: NONREACTIVE
Hepatitis B Surface Ag: NONREACTIVE
Hepatitis C Ab: NONREACTIVE
SIGNAL TO CUT-OFF: 0.01 (ref <1.00)

## 2017-09-10 ENCOUNTER — Other Ambulatory Visit (INDEPENDENT_AMBULATORY_CARE_PROVIDER_SITE_OTHER): Payer: Self-pay | Admitting: *Deleted

## 2017-09-10 DIAGNOSIS — R7401 Elevation of levels of liver transaminase levels: Secondary | ICD-10-CM

## 2017-09-10 DIAGNOSIS — R740 Nonspecific elevation of levels of transaminase and lactic acid dehydrogenase [LDH]: Principal | ICD-10-CM

## 2017-09-11 ENCOUNTER — Ambulatory Visit: Payer: Medicaid Other | Admitting: Nurse Practitioner

## 2017-09-11 VITALS — BP 104/65 | HR 77 | Temp 96.8°F | Ht 66.0 in | Wt 204.0 lb

## 2017-09-11 DIAGNOSIS — K5900 Constipation, unspecified: Secondary | ICD-10-CM

## 2017-09-11 NOTE — Patient Instructions (Signed)

## 2017-09-11 NOTE — Progress Notes (Signed)
   Subjective:    Patient ID: Rachael Watson, female    DOB: October 19, 1965, 52 y.o.   MRN: [id number]  HPI Patient come sin today c/o hard knot left flank. Was harder last night . Sore to touch. Having trouble with constipation. Stool looks like clay. She has had trouble with constipation for sometime. She had colonoscopy on 08/27/17 and was negative according to report. LFT's were slightly elevated on 08/06/17. They are telling her she had liver disease. Her last LFT were basically normal other than hr ALT was in the 50's    Review of Systems  Constitutional: Negative.   HENT: Negative.   Respiratory: Negative.   Cardiovascular: Negative.   Gastrointestinal: Positive for abdominal pain and constipation.  Genitourinary: Negative.   Neurological: Negative.   Psychiatric/Behavioral: Negative.   All other systems reviewed and are negative.      Objective:   Physical Exam  Constitutional: She is oriented to person, place, and time. She appears well-developed and well-nourished. No distress.  Cardiovascular: Normal rate and regular rhythm.  Pulmonary/Chest: Effort normal and breath sounds normal.  Abdominal: Soft. There is tenderness (mild left upper quadrant oain on palpation.).  Slow bowel saounds  Neurological: She is alert and oriented to person, place, and time.  Skin: Skin is warm.  Psychiatric: She has a normal mood and affect. Her behavior is normal. Judgment and thought content normal.   BP 104/65   Pulse 77   Temp (!) 96.8 F (36 C) (Oral)   Ht 5\' 6"  (1.676 m)   Wt 204 lb (92.5 kg)   BMI 32.93 kg/m         Assessment & Plan:   1. Constipation, unspecified constipation type    Milk of magensia and 6oz of prune juice. Increase fiber in diet Follow up with [facility] or GI next week  Mary-Margaret Hassell Done, FNP

## 2017-09-21 ENCOUNTER — Telehealth: Payer: Self-pay

## 2017-09-21 ENCOUNTER — Other Ambulatory Visit: Payer: Self-pay | Admitting: *Deleted

## 2017-09-21 DIAGNOSIS — I509 Heart failure, unspecified: Secondary | ICD-10-CM

## 2017-09-21 DIAGNOSIS — R739 Hyperglycemia, unspecified: Secondary | ICD-10-CM

## 2017-09-21 NOTE — Telephone Encounter (Signed)
Referral to Cardiologist

## 2017-09-21 NOTE — Telephone Encounter (Signed)
Aware.  Referral done to endocrinology.

## 2017-09-21 NOTE — Telephone Encounter (Signed)
Pt calling to talk to nurse about her insulin. Pt wants humalog for during the day. Please call back

## 2017-09-21 NOTE — Telephone Encounter (Signed)
Wants referral to heart Dr

## 2017-09-21 NOTE — Telephone Encounter (Signed)
Patient requesting humalog because she says she spiked blood sugar 200mg .  Last A1C was normal but patient disagrees with this.  Provider will refer to endocrinology for care.

## 2017-09-23 ENCOUNTER — Ambulatory Visit: Payer: Medicaid Other | Admitting: Family

## 2017-09-24 ENCOUNTER — Telehealth: Payer: Self-pay

## 2017-09-24 DIAGNOSIS — R55 Syncope and collapse: Secondary | ICD-10-CM

## 2017-09-24 NOTE — Telephone Encounter (Signed)
Wants a referral to Neurology   Same symptoms has her Mom blacking out and her Mom died

## 2017-09-24 NOTE — Telephone Encounter (Signed)
Referral ordered

## 2017-09-30 ENCOUNTER — Ambulatory Visit: Payer: Medicaid Other | Admitting: Family

## 2017-09-30 ENCOUNTER — Telehealth: Payer: Self-pay

## 2017-09-30 NOTE — Telephone Encounter (Signed)
PT has already had colonoscopy and seen GI. Can she call them and follow up accordingly.

## 2017-09-30 NOTE — Telephone Encounter (Signed)
Nothing helping for constipation   Wants a referral for endoscopy to Dr she went to for colonoscopy

## 2017-09-30 NOTE — Telephone Encounter (Signed)
Pt aware to contact GI

## 2017-10-08 ENCOUNTER — Telehealth: Payer: Self-pay | Admitting: Family

## 2017-10-08 MED ORDER — MECLIZINE HCL 25 MG PO TABS: 25.0000 mg | ORAL_TABLET | Freq: Three times a day (TID) | ORAL | 2 refills | Status: DC | PRN

## 2017-10-08 NOTE — Telephone Encounter (Signed)
What is the name of the medication?   meclizine (ANTIVERT) 25 MG tablet        Have you contacted your pharmacy to request a refill? yes Which pharmacy would you like this sent to? [facility]   Patient notified that their request is being sent to the clinical staff for review and that they should receive a call once it is complete. If they do not receive a call within 24 hours they can check with their pharmacy or our office.

## 2017-10-08 NOTE — Telephone Encounter (Signed)
Prescription sent to pharmacy.

## 2017-10-08 NOTE — Telephone Encounter (Signed)
Please advise on medication request

## 2017-10-13 ENCOUNTER — Ambulatory Visit: Payer: Medicaid Other | Admitting: Cardiology

## 2017-10-13 ENCOUNTER — Encounter: Payer: Self-pay | Admitting: Cardiology

## 2017-10-13 ENCOUNTER — Telehealth: Payer: Self-pay | Admitting: Cardiology

## 2017-10-13 ENCOUNTER — Encounter: Payer: Self-pay | Admitting: *Deleted

## 2017-10-13 VITALS — BP 118/78 | HR 90 | Ht 66.0 in | Wt 206.0 lb

## 2017-10-13 DIAGNOSIS — R0602 Shortness of breath: Secondary | ICD-10-CM

## 2017-10-13 DIAGNOSIS — I509 Heart failure, unspecified: Secondary | ICD-10-CM | POA: Diagnosis not present

## 2017-10-13 DIAGNOSIS — R0789 Other chest pain: Secondary | ICD-10-CM | POA: Diagnosis not present

## 2017-10-13 NOTE — Patient Instructions (Signed)
Your physician recommends that you schedule a follow-up appointment in: TO [place]  Your physician recommends that you continue on your current medications as directed. Please refer to the Current Medication list given to you today.  Your physician has requested that you have an echocardiogram. Echocardiography is a painless test that uses sound waves to create images of your heart. It provides your doctor with information about the size and shape of your heart and how well your heart's chambers and valves are working. This procedure takes approximately one hour. There are no restrictions for this procedure.   Thank you for choosing [facility]!!

## 2017-10-13 NOTE — Telephone Encounter (Signed)
Pre-cert Verification for the following procedure   Echo scheduled for 10-14-17 at [facility]

## 2017-10-13 NOTE — Progress Notes (Signed)
Clinical Summary Rachael Watson is a 52 y.o.female seen today as a new consult, referred for history of CHF.   1. CHF - this is a self reported history - she reports being managed for this by Dr Marya Landry in [place], [place] who has since retired and records are not available.  - she is unsure of the type of heart failure, or the current status.   -has had some recent LE edema. Can have some SOB/DOE.  - has been on lasix for roughly 10 years   2. Chest pain - feeling of tightness midchest, 7/10 in severity. Mainly with exertion. Can feel nauseous, sweaty. Can feel lightheaded, can feel like passing.  - tightness lasts seconds to minutes. Occurs sporadically - CAD risk factors: HL, tobacco, DM2 off meds    Past Medical History:  Diagnosis Date  . Anxiety   . CHF (congestive heart failure) ([place])    2009  . Diabetes mellitus without complication ([place])     diet control 1 year ago  . GERD (gastroesophageal reflux disease)   . Thyroid disease      Allergies  Allergen Reactions  . Abilify [Aripiprazole]     Violent behavior  . Seroquel [Quetiapine]     Tremors and unable to swallow     Current Outpatient Medications  Medication Sig Dispense Refill  . albuterol (PROVENTIL) (2.5 MG/3ML) 0.083% nebulizer solution Take 3 mLs (2.5 mg total) by nebulization every 6 (six) hours as needed for wheezing or shortness of breath. 150 mL 1  . aspirin 325 MG EC tablet Take 325 mg by mouth daily.    Marland Kitchen atorvastatin (LIPITOR) 40 MG tablet Take 1 tablet (40 mg total) by mouth daily. 30 tablet 5  . blood glucose meter kit and supplies Dispense based on patient and insurance preference. Use up to four times daily as directed. (FOR ICD-10 E10.9, E11.9). 1 each 0  . busPIRone (BUSPAR) 30 MG tablet Take by mouth.    . Carboxymethylcellul-Glycerin (LUBRICATING EYE DROPS OP) Place 1 drop into both eyes daily as needed (dry eyes).    . citalopram (CELEXA) 40 MG tablet Take 40 mg by mouth daily.    .  cyclobenzaprine (FLEXERIL) 10 MG tablet Take 10 mg by mouth 3 (three) times daily.    . diphenhydrAMINE (BENADRYL) 25 MG tablet Take 50 mg by mouth daily.    . furosemide (LASIX) 20 MG tablet Take 1 tablet (20 mg total) by mouth daily. 30 tablet 6  . glucose blood (ACCU-CHEK AVIVA PLUS) test strip Use to check blood sugars up to 4 times daily 400 each 0  . HYDROcodone-acetaminophen (NORCO) 10-325 MG tablet Take 1 tablet by mouth 3 times daily  0  . hydrOXYzine (ATARAX/VISTARIL) 50 MG tablet Take by mouth.    . levothyroxine (SYNTHROID, LEVOTHROID) 100 MCG tablet Take 1 tablet (100 mcg total) by mouth daily before breakfast. 90 tablet 1  . meclizine (ANTIVERT) 25 MG tablet Take 1 tablet (25 mg total) by mouth 3 (three) times daily as needed for dizziness. 60 tablet 2  . Multiple Vitamin (MULTIVITAMIN WITH MINERALS) TABS tablet Take 1 tablet by mouth daily.    Marland Kitchen omeprazole (PRILOSEC) 20 MG capsule Take 1 capsule (20 mg total) by mouth daily. 90 capsule 1  . PROVENTIL HFA 108 (90 Base) MCG/ACT inhaler USE 2 PUFFS EVERY 6 HOURS AS NEEDED FOR WHEEZING AND SHORTNESS OF BREATH 18 g 1   No current facility-administered medications for this visit.  Past Surgical History:  Procedure Laterality Date  . COLONOSCOPY WITH PROPOFOL N/A 08/27/2017   Procedure: COLONOSCOPY WITH PROPOFOL;  Surgeon: Rogene Houston, MD;  [facility];  Service: Endoscopy;  Laterality: N/A;  . COSMETIC SURGERY    . TUBAL LIGATION       Allergies  Allergen Reactions  . Abilify [Aripiprazole]     Violent behavior  . Seroquel [Quetiapine]     Tremors and unable to swallow      No family history on file.   Social History Ms. Amodei reports that she has been smoking cigarettes.  She has been smoking about 1.00 pack per day. She has never used smokeless tobacco. Ms. Kerekes reports that she does not drink alcohol.   Review of Systems CONSTITUTIONAL: No weight loss, fever, chills, weakness or  fatigue.  HEENT: Eyes: No visual loss, blurred vision, double vision or yellow sclerae.No hearing loss, sneezing, congestion, runny nose or sore throat.  SKIN: No rash or itching.  CARDIOVASCULAR: per hpi RESPIRATORY:per hpi  GASTROINTESTINAL: No anorexia, nausea, vomiting or diarrhea. No abdominal pain or blood.  GENITOURINARY: No burning on urination, no polyuria NEUROLOGICAL: No headache, dizziness, syncope, paralysis, ataxia, numbness or tingling in the extremities. No change in bowel or bladder control.  MUSCULOSKELETAL: No muscle, back pain, joint pain or stiffness.  LYMPHATICS: No enlarged nodes. No history of splenectomy.  PSYCHIATRIC: No history of depression or anxiety.  ENDOCRINOLOGIC: No reports of sweating, cold or heat intolerance. No polyuria or polydipsia.  Marland Kitchen   Physical Examination Vitals:   10/13/17 0840 10/13/17 0843  BP: 112/73 118/78  Pulse: 91 90  SpO2: 98%    Vitals:   10/13/17 0840  Weight: 206 lb (93.4 kg)  Height: '5\' 6"'  (1.676 m)    Gen: resting comfortably, no acute distress HEENT: no scleral icterus, pupils equal round and reactive, no palptable cervical adenopathy,  CV: RRR, no m/r/g, nojvd Resp: Clear to auscultation bilaterally GI: abdomen is soft, non-tender, non-distended, normal bowel sounds, no hepatosplenomegaly MSK: extremities are warm, no edema.  Skin: warm, no rash Neuro:  no focal deficits Psych: appropriate affect    Assessment and Plan  1. CHF - this is a self reported history, details are unclear. She reports she has some records at home she will bring in - obtain echo to evaluate for underlying cardiac dysfunction   2. Chest pain - f/u echo results, pending results likely consider lexiscan   F/u pending testing results.       Arnoldo Lenis, M.D.,

## 2017-10-14 ENCOUNTER — Ambulatory Visit (HOSPITAL_COMMUNITY)
Admission: RE | Admit: 2017-10-14 | Discharge: 2017-10-14 | Disposition: A | Discharge status: Home / Self Care | Payer: Medicaid Other | Source: Ambulatory Visit | Attending: Cardiology | Admitting: Cardiology

## 2017-10-14 DIAGNOSIS — E119 Type 2 diabetes mellitus without complications: Secondary | ICD-10-CM | POA: Insufficient documentation

## 2017-10-14 DIAGNOSIS — I080 Rheumatic disorders of both mitral and aortic valves: Secondary | ICD-10-CM | POA: Diagnosis not present

## 2017-10-14 DIAGNOSIS — I509 Heart failure, unspecified: Secondary | ICD-10-CM | POA: Insufficient documentation

## 2017-10-14 DIAGNOSIS — R0602 Shortness of breath: Secondary | ICD-10-CM | POA: Diagnosis not present

## 2017-10-14 NOTE — Progress Notes (Signed)
*  PRELIMINARY RESULTS* Echocardiogram 2D Echocardiogram has been performed.  Leavy Cella 10/14/2017, 10:31 AM

## 2017-10-17 ENCOUNTER — Encounter: Payer: Self-pay | Admitting: Cardiology

## 2017-10-18 ENCOUNTER — Telehealth: Payer: Self-pay | Admitting: *Deleted

## 2017-10-18 ENCOUNTER — Encounter: Payer: Self-pay | Admitting: *Deleted

## 2017-10-18 DIAGNOSIS — R079 Chest pain, unspecified: Secondary | ICD-10-CM

## 2017-10-18 NOTE — Telephone Encounter (Signed)
-----   Message from Arnoldo Lenis, MD sent at 10/18/2017  1:28 PM EDT ----- Echo looks good, normal heart function. Can we get a lexiscan for chest pain, does not need to hold meds   Zandra Abts MD

## 2017-10-18 NOTE — Telephone Encounter (Signed)
Pt aware and agreeable to stress test. Will place orders and forward to schedulers - routed echo results to pcp

## 2017-10-19 ENCOUNTER — Telehealth: Payer: Self-pay | Admitting: Cardiology

## 2017-10-19 NOTE — Telephone Encounter (Signed)
Pre-cert Verification for the following procedure    stress test on 4/23  At [facility] .

## 2017-10-20 ENCOUNTER — Telehealth: Payer: Self-pay | Admitting: *Deleted

## 2017-10-20 NOTE — Telephone Encounter (Signed)
Pt spoke with scheduler - A. Horton, stating she had a note sent to our office for paper work to be completed by our office so she could be refunded for the hydrocodone we prescribed and she now gets from pain management.

## 2017-10-21 ENCOUNTER — Ambulatory Visit: Payer: Medicaid Other | Admitting: Podiatry

## 2017-10-21 DIAGNOSIS — M79676 Pain in unspecified toe(s): Secondary | ICD-10-CM | POA: Diagnosis not present

## 2017-10-21 DIAGNOSIS — B351 Tinea unguium: Secondary | ICD-10-CM | POA: Diagnosis not present

## 2017-10-21 DIAGNOSIS — M79674 Pain in right toe(s): Secondary | ICD-10-CM

## 2017-10-21 DIAGNOSIS — M2042 Other hammer toe(s) (acquired), left foot: Secondary | ICD-10-CM | POA: Diagnosis not present

## 2017-10-21 DIAGNOSIS — Q828 Other specified congenital malformations of skin: Secondary | ICD-10-CM

## 2017-10-21 DIAGNOSIS — M2041 Other hammer toe(s) (acquired), right foot: Secondary | ICD-10-CM | POA: Diagnosis not present

## 2017-10-21 DIAGNOSIS — M79675 Pain in left toe(s): Secondary | ICD-10-CM

## 2017-10-21 DIAGNOSIS — E1149 Type 2 diabetes mellitus with other diabetic neurological complication: Secondary | ICD-10-CM

## 2017-10-26 ENCOUNTER — Encounter (HOSPITAL_COMMUNITY): Payer: Medicaid Other

## 2017-10-26 NOTE — Progress Notes (Signed)
Subjective: 52 y.o. returns the office today for painful, elongated, thickened toenails which she cannot trim herself as well as for painful thick calluses.  She denies any redness or drainage coming from the sites.  She also states that she did not pick up her prescription for diabetic shoes and she is requesting a new prescription today.  She has no changes in the last saw her she has no new concerns. Denies any systemic complaints such as fevers, chills, nausea, vomiting.   PCP: Sharion Balloon, FNP  Objective: AAO 3, NAD DP/PT pulses palpable, CRT less than 3 seconds Protective sensation decreased with Simms Weinstein monofilament Nails hypertrophic, dystrophic, elongated, brittle, discolored 10. There is tenderness overlying the nails 1-5 bilaterally. There is no surrounding erythema or drainage along the nail sites. Hyperkeratotic lesions present bilateral submetatarsal 2, 5, bilateral hallux, bilateral lateral heels.  Upon debridement there is no underlying ulceration, drainage or any clinical signs of infection noted. No open lesions or other pre-ulcerative lesions are identified. Hammertoes are present No other areas of tenderness bilateral lower extremities. No overlying edema, erythema, increased warmth. No pain with calf compression, swelling, warmth, erythema.  Assessment: Patient presents with symptomatic onychomycosis; symptomatic skin lesions  Plan: -Treatment options including alternatives, risks, complications were discussed -Nails sharply debrided 10 without complication/bleeding. -Hyperkeratotic lesions were sharply debrided x8 without any complications or bleeding -New prescription was given today for diabetic shoes and inserts to help accommodate her foot help take pressure off the painful calluses. -Discussed daily foot inspection. If there are any changes, to call the office immediately.  -Follow-up in 9 weeks at her request or sooner if any problems are to arise.  In the meantime, encouraged to call the office with any questions, concerns, changes symptoms.  Celesta Gentile, DPM

## 2017-10-28 ENCOUNTER — Encounter (INDEPENDENT_AMBULATORY_CARE_PROVIDER_SITE_OTHER): Payer: Self-pay | Admitting: *Deleted

## 2017-10-28 ENCOUNTER — Other Ambulatory Visit (INDEPENDENT_AMBULATORY_CARE_PROVIDER_SITE_OTHER): Payer: Self-pay | Admitting: *Deleted

## 2017-10-28 DIAGNOSIS — R7401 Elevation of levels of liver transaminase levels: Secondary | ICD-10-CM

## 2017-10-28 DIAGNOSIS — R740 Nonspecific elevation of levels of transaminase and lactic acid dehydrogenase [LDH]: Principal | ICD-10-CM

## 2017-10-29 ENCOUNTER — Telehealth: Payer: Self-pay | Admitting: Podiatry

## 2017-10-29 NOTE — Telephone Encounter (Signed)
Hi Rachael Watson, this is Syrina Wake calling in reference to a hydrocodone refill from 03/19/2017. I just talked to [facility] and they said you should have already had the approval but its not showing up. She said it only takes 48-72 hours to get that approval. If you could please give me a call back at [phone] so we can get this straightened out. Thank you.

## 2017-11-01 ENCOUNTER — Telehealth: Payer: Self-pay

## 2017-11-01 DIAGNOSIS — Z124 Encounter for screening for malignant neoplasm of cervix: Secondary | ICD-10-CM

## 2017-11-01 NOTE — Telephone Encounter (Signed)
Referral Gyn

## 2017-11-01 NOTE — Addendum Note (Signed)
Addended by: Evelina Dun A on: 11/01/2017 04:17 PM   Modules accepted: Orders

## 2017-11-01 NOTE — Telephone Encounter (Signed)
Last time I was in asked for a referral to GYN in the [facility]

## 2017-11-09 ENCOUNTER — Encounter (HOSPITAL_COMMUNITY): Payer: Medicaid Other

## 2017-11-11 ENCOUNTER — Encounter (HOSPITAL_COMMUNITY)
Admission: RE | Admit: 2017-11-11 | Discharge: 2017-11-11 | Disposition: A | Discharge status: Home / Self Care | Payer: Medicaid Other | Source: Ambulatory Visit | Attending: Cardiology | Admitting: Cardiology

## 2017-11-11 ENCOUNTER — Encounter (HOSPITAL_BASED_OUTPATIENT_CLINIC_OR_DEPARTMENT_OTHER)
Admission: RE | Admit: 2017-11-11 | Discharge: 2017-11-11 | Disposition: A | Discharge status: Home / Self Care | Payer: Medicaid Other | Source: Ambulatory Visit | Attending: Cardiology | Admitting: Cardiology

## 2017-11-11 ENCOUNTER — Encounter (HOSPITAL_COMMUNITY): Payer: Self-pay

## 2017-11-11 DIAGNOSIS — R079 Chest pain, unspecified: Secondary | ICD-10-CM | POA: Diagnosis not present

## 2017-11-11 LAB — NM MYOCAR MULTI W/SPECT W/WALL MOTION / EF
CHL CUP NUCLEAR SDS: 2
CHL CUP NUCLEAR SRS: 1
CHL CUP NUCLEAR SSS: 3
LHR: 0.27
LV sys vol: 23 mL
LVDIAVOL: 66 mL (ref 46–106)
NUC STRESS TID: 1.15
Peak HR: 116 {beats}/min
Rest HR: 83 {beats}/min

## 2017-11-11 MED ORDER — TECHNETIUM TC 99M TETROFOSMIN IV KIT
10.0000 | PACK | Freq: Once | INTRAVENOUS | Status: AC | PRN
  Administered 2017-11-11: 11 via INTRAVENOUS

## 2017-11-11 MED ORDER — REGADENOSON 0.4 MG/5ML IV SOLN
INTRAVENOUS | Status: AC
  Administered 2017-11-11: 0.4 mg via INTRAVENOUS
  Filled 2017-11-11: qty 5

## 2017-11-11 MED ORDER — SODIUM CHLORIDE 0.9% FLUSH
INTRAVENOUS | Status: AC
  Filled 2017-11-11: qty 200

## 2017-11-11 MED ORDER — SODIUM CHLORIDE 0.9% FLUSH
INTRAVENOUS | Status: AC
  Administered 2017-11-11: 10 mL via INTRAVENOUS
  Filled 2017-11-11: qty 10

## 2017-11-11 MED ORDER — TECHNETIUM TC 99M TETROFOSMIN IV KIT
30.0000 | PACK | Freq: Once | INTRAVENOUS | Status: AC | PRN
Start: 2017-11-11 — End: 2017-11-11
  Administered 2017-11-11: 32.2 via INTRAVENOUS

## 2017-11-12 ENCOUNTER — Telehealth: Payer: Self-pay | Admitting: *Deleted

## 2017-11-12 ENCOUNTER — Telehealth: Payer: Self-pay | Admitting: Family

## 2017-11-12 MED ORDER — METOPROLOL TARTRATE 25 MG PO TABS: 12.5000 mg | ORAL_TABLET | Freq: Two times a day (BID) | ORAL | 1 refills | Status: DC

## 2017-11-12 NOTE — Telephone Encounter (Signed)
-----   Message from Arnoldo Lenis, MD sent at 11/12/2017  3:14 PM EDT ----- Stress test suggests a possible very small blockage at the tip of the heart. This is not large enough to be of significant risk but can cause some symptoms. Usually we try treating this with medications initially. Can we start her on lopressor 12.5mg  bid and have her f/u 3 weeks   Zandra Abts MD

## 2017-11-12 NOTE — Telephone Encounter (Signed)
Appt made for 11/15/17

## 2017-11-12 NOTE — Telephone Encounter (Signed)
Pt aware and voiced understanding - Lopressor sent to [facility] as requested - f/u appt 6/12 - routed to pcp

## 2017-11-15 ENCOUNTER — Ambulatory Visit: Payer: Medicaid Other | Admitting: Family

## 2017-11-15 ENCOUNTER — Telehealth: Payer: Self-pay | Admitting: Family

## 2017-11-15 NOTE — Telephone Encounter (Signed)
Pt called in this morning and was very upset about her records feels that it was not handled appropriately. States that they were not copied right.

## 2017-11-23 ENCOUNTER — Telehealth: Payer: Self-pay | Admitting: *Deleted

## 2017-11-23 NOTE — Telephone Encounter (Signed)
Pt says since starting Lopressor has felt less palpitations however has had worsening dizziness and lightheadedness. Doesn't know what HR/BP has been running - denies chest pain/swelling but c/o occasional chest heaviness and SOB - concerned that blockage could be causing symptoms - will forward to provider if pt needs appt for nursing visit

## 2017-11-24 ENCOUNTER — Ambulatory Visit (INDEPENDENT_AMBULATORY_CARE_PROVIDER_SITE_OTHER): Payer: Medicaid Other | Admitting: Physician Assistant

## 2017-11-24 ENCOUNTER — Encounter: Payer: Self-pay | Admitting: Physician Assistant

## 2017-11-24 VITALS — BP 110/80 | HR 85 | Ht 66.0 in | Wt 208.0 lb

## 2017-11-24 DIAGNOSIS — R9439 Abnormal result of other cardiovascular function study: Secondary | ICD-10-CM

## 2017-11-24 DIAGNOSIS — R079 Chest pain, unspecified: Secondary | ICD-10-CM

## 2017-11-24 DIAGNOSIS — R0602 Shortness of breath: Secondary | ICD-10-CM | POA: Diagnosis not present

## 2017-11-24 DIAGNOSIS — E1169 Type 2 diabetes mellitus with other specified complication: Secondary | ICD-10-CM | POA: Diagnosis not present

## 2017-11-24 DIAGNOSIS — E669 Obesity, unspecified: Secondary | ICD-10-CM | POA: Diagnosis not present

## 2017-11-24 MED ORDER — ISOSORBIDE MONONITRATE ER 30 MG PO TB24: 15.0000 mg | ORAL_TABLET | Freq: Every day | ORAL | 3 refills | Status: DC

## 2017-11-24 MED ORDER — ASPIRIN EC 81 MG PO TBEC: 81.0000 mg | DELAYED_RELEASE_TABLET | Freq: Every day | ORAL | 3 refills | Status: DC

## 2017-11-24 NOTE — H&P (View-Only) (Signed)
Cardiology Office Note    Date:  11/24/2017  ID:  Rachael Watson, DOB [birth year], MRN [id number] PCP:  Sharion Balloon, FNP  Cardiologist:  No primary care provider on file.  Chief Complaint: Dr. Harl Bowie  History of Present Illness:  Rachael Watson is a 52 y.o. female with history of possible prior CHF, HLD, tobacco abuse, obesity, thyroid disease, GERD, anxiety, diet controlled DM who presents for evaluation of persistent chest pain/SOB. Regarding CHF, she reports being managed for this by Dr Marya Landry in [place], [place] who has since retired and records are not available.   She was seen back in 10/2017 by Dr. Harl Bowie for episodic chest tightness, mainly with exertion, associated with nausea and sweating. It would last seconds to minutes and occur sporadically. Last labs earlier this year showed AST 23, ALT 41, WBC 14, Hgb 138, Plt 277, K 4.6, Cr 0.64, LDL 166. 2D echo showed mild LHV EF 60-65%, mild MR/AI. Nuclear stress test was c/w mild apical ischemia, low risk. She was trialed on Lopressor 12.67m BID which helped the sensation of chest "pounding" with the chest pain, but did not really eliminate the episodes. She called in reporting continued symptoms and I added her onto my clinic. She continues to affirm the above symptoms, about 2-3 times a week. There have been a few nights where it wakes her out of sleep. She continues to smoke. She has noticed DOE over the last few months. No LEE. She has family history of CAD/CABG in father in his 632s She feels well today without complaint - no dizziness, lightheadedness, acute CP or SOB.  Past Medical History:  Diagnosis Date  . Anxiety   . [place]    [place]  . [place]     diet control 1 year ago  . GERD (gastroesophageal reflux disease)   . Thyroid disease     Past Surgical History:  Procedure Laterality Date  . COLONOSCOPY WITH PROPOFOL N/A 08/27/2017   Procedure: COLONOSCOPY WITH  PROPOFOL;  Surgeon: RRogene Houston MD;  [facility];  Service: Endoscopy;  Laterality: N/A;  . COSMETIC SURGERY    . TUBAL LIGATION      Current Medications: Current Meds  Medication Sig  . albuterol (PROVENTIL) (2.5 MG/3ML) 0.083% nebulizer solution Take 3 mLs (2.5 mg total) by nebulization every 6 (six) hours as needed for wheezing or shortness of breath.  .Marland Kitchenaspirin 325 MG EC tablet Take 325 mg by mouth daily.  .Marland Kitchenatorvastatin (LIPITOR) 40 MG tablet Take 1 tablet (40 mg total) by mouth daily.  . blood glucose meter kit and supplies Dispense based on patient and insurance preference. Use up to four times daily as directed. (FOR ICD-10 E10.9, E11.9).  . busPIRone (BUSPAR) 30 MG tablet Take by mouth.  . Carboxymethylcellul-Glycerin (LUBRICATING EYE DROPS OP) Place 1 drop into both eyes daily as needed (dry eyes).  . citalopram (CELEXA) 40 MG tablet Take 40 mg by mouth daily.  . cyclobenzaprine (FLEXERIL) 10 MG tablet Take 10 mg by mouth 3 (three) times daily.  . diphenhydrAMINE (BENADRYL) 25 MG tablet Take 50 mg by mouth daily.  . furosemide (LASIX) 20 MG tablet Take 1 tablet (20 mg total) by mouth daily.  .Marland Kitchenglucose blood (ACCU-CHEK AVIVA PLUS) test strip Use to check blood sugars up to 4 times daily  . HYDROcodone-acetaminophen (NORCO) 10-325 MG tablet Take 1 tablet by mouth 3 times daily  . hydrOXYzine (ATARAX/VISTARIL) 50 MG  tablet Take by mouth.  . levothyroxine (SYNTHROID, LEVOTHROID) 100 MCG tablet Take 1 tablet (100 mcg total) by mouth daily before breakfast.  . meclizine (ANTIVERT) 25 MG tablet Take 1 tablet (25 mg total) by mouth 3 (three) times daily as needed for dizziness.  . metoprolol tartrate (LOPRESSOR) 25 MG tablet Take 0.5 tablets (12.5 mg total) by mouth 2 (two) times daily.  . Multiple Vitamin (MULTIVITAMIN WITH MINERALS) TABS tablet Take 1 tablet by mouth daily.  Marland Kitchen omeprazole (PRILOSEC) 20 MG capsule Take 1 capsule (20 mg total) by mouth daily.  Marland Kitchen PROVENTIL  HFA 108 (90 Base) MCG/ACT inhaler USE 2 PUFFS EVERY 6 HOURS AS NEEDED FOR WHEEZING AND SHORTNESS OF BREATH    Allergies:   Abilify [aripiprazole] and Seroquel [quetiapine]   Social History   Socioeconomic History  . Marital status: Divorced    Spouse name: Not on file  . Number of children: Not on file  . Years of education: Not on file  . Highest education level: Not on file  Occupational History  . Not on file  Social Needs  . Financial resource strain: Not on file  . Food insecurity:    Worry: Not on file    Inability: Not on file  . Transportation needs:    Medical: Not on file    Non-medical: Not on file  Tobacco Use  . Smoking status: Current Every Day Smoker    Packs/day: 1.00    Types: Cigarettes  . Smokeless tobacco: Never Used  Substance and Sexual Activity  . Alcohol use: No    Frequency: Never  . Drug use: No  . Sexual activity: Not on file  Lifestyle  . Physical activity:    Days per week: Not on file    Minutes per session: Not on file  . Stress: Not on file  Relationships  . Social connections:    Talks on phone: Not on file    Gets together: Not on file    Attends religious service: Not on file    Active member of club or organization: Not on file    Attends meetings of clubs or organizations: Not on file    Relationship status: Not on file  Other Topics Concern  . Not on file  Social History Narrative  . Not on file     Family History:  The patient's family history includes Brain cancer in her mother; Heart attack in her father and mother.  ROS:   Please see the history of present illness.  All other systems are reviewed and otherwise negative.    PHYSICAL EXAM:   VS:  BP 110/80   Pulse 85   Ht _0  (1.676 m)   Wt 208 lb (94.3 kg)   SpO2 98%   BMI 33.57 kg/m   BMI: Body mass index is 33.57 kg/m. GEN: Well nourished, well developed WF, in no acute distress HEENT: normocephalic, atraumatic Neck: no JVD, carotid bruits, or  masses Cardiac: RRR; no murmurs, rubs, or gallops, no edema  Respiratory:  clear to auscultation bilaterally, normal work of breathing GI: soft, nontender, nondistended, + BS MS: no deformity or atrophy Skin: warm and dry, no rash Neuro:  Alert and Oriented x 3, Strength and sensation are intact, follows commands Psych: euthymic mood, full affect  Wt Readings from Last 3 Encounters:  11/24/17 208 lb (94.3 kg)  10/13/17 206 lb (93.4 kg)  09/11/17 204 lb (92.5 kg)      Studies/Labs Reviewed:  EKG:  EKG was ordered today and personally reviewed by me and demonstrates EKG NSR no acute changes Recent Labs: 08/06/2017: BUN 14; Creatinine, Ser 0.64; Potassium 4.6; Sodium 140 08/25/2017: Hemoglobin 13.8; Platelets 277 09/08/2017: ALT 41   Lipid Panel    Component Value Date/Time   CHOL 256 (H) 07/02/2017 1012   TRIG 216 (H) 07/02/2017 1012   HDL 47 07/02/2017 1012   CHOLHDL 5.4 (H) 07/02/2017 1012   LDLCALC 166 (H) 07/02/2017 1012    Additional studies/ records that were reviewed today include: Summarized above    ASSESSMENT & PLAN:   1. Chest pain/dyspnea/abnormal stress test concerning for angina pectoris - she only had mild improvement with Lopressor. Has multiple cardiac risk factors. D/w Dr. Johnsie Cancel (DOD). Recommend cath for definitive evaluation. Risks and benefits of cardiac catheterization have been discussed with the patient.  These include bleeding, infection, kidney damage, stroke, heart attack, death. The patient understands these risks and is willing to proceed. Will plan on this Friday. Check labs in AM as lab is closed - will obtain CMET, CBC, TSH, lipid profile. Add Imdur 68m nightly. Decrease ASA to 848mdaily. Warning sx/ER precautions reviewed - she knows to seek medical attention if symptoms recur or escalate. 2. Diabetes in nonobese - she states this is diet controlled. 3. Hyperlipidemia - LDL 166 in 06/2017 prior to initiation of statin therapy. Will recheck  lipids when she returns fasting.  Disposition: F/u with Dr. BrHarl Bowies scheduled after cath 12/15/17.   Medication Adjustments/Labs and Tests Ordered: Current medicines are reviewed at length with the patient today.  Concerns regarding medicines are outlined above. Medication changes, Labs and Tests ordered today are summarized above and listed in the Patient Instructions accessible in Encounters.   Signed, DaCharlie PitterPA-C  11/24/2017 4:22 PM    [facility] [place] [id number]: [phone] [phone]

## 2017-11-24 NOTE — Telephone Encounter (Signed)
Can we see if we can get this patient into clinic this afternoon? Arrive at 330, can see around 4pm. May need cath. Luisdaniel Kenton PA-C

## 2017-11-24 NOTE — Patient Instructions (Addendum)
   [place] [address] Dept: [phone] Loc: [phone]  Rachael Watson  11/24/2017  You are scheduled for a Cardiac Catheterization on Friday, May 24 with Dr. Larae Grooms.  1. Please arrive at the [facility] (Main Entrance A) at [facility]: [place],  [address] at 5 am (two hours before your procedure to ensure your preparation). Free valet parking service is available.   Special note: Every effort is made to have your procedure done on time. Please understand that emergencies sometimes delay scheduled procedures.  2. Diet: Do not eat or drink anything after midnight prior to your procedure except sips of water to take medications.  3. Labs: get lab work tomorrow 11/25/17  4. Medication instructions in preparation for your procedure:  HOLD Lasix the morning of cath    On the morning of your procedure, take your Aspirin 81 mg and any morning medicines NOT listed above.  You may use sips of water.  5. Plan for one night stay--bring personal belongings. 6. Bring a current list of your medications and current insurance cards. 7. You MUST have a responsible person to drive you home. 8. Someone MUST be with you the first 24 hours after you arrive home or your discharge will be delayed. 9. Please wear clothes that are easy to get on and off and wear slip-on shoes.  Thank you for allowing [place] to care for you!   -- [facility]

## 2017-11-24 NOTE — Progress Notes (Signed)
Cardiology Office Note    Date:  11/24/2017  ID:  Rachael Watson, DOB [birth year], MRN [id number] PCP:  Sharion Balloon, FNP  Cardiologist:  No primary care provider on file.  Chief Complaint: Dr. Harl Bowie  History of Present Illness:  Rachael Watson is a 52 y.o. female with history of possible prior CHF, HLD, tobacco abuse, obesity, thyroid disease, GERD, anxiety, diet controlled DM who presents for evaluation of persistent chest pain/SOB. Regarding CHF, she reports being managed for this by Dr Marya Landry in [place], [place] who has since retired and records are not available.   She was seen back in 10/2017 by Dr. Harl Bowie for episodic chest tightness, mainly with exertion, associated with nausea and sweating. It would last seconds to minutes and occur sporadically. Last labs earlier this year showed AST 23, ALT 41, WBC 14, Hgb 138, Plt 277, K 4.6, Cr 0.64, LDL 166. 2D echo showed mild LHV EF 60-65%, mild MR/AI. Nuclear stress test was c/w mild apical ischemia, low risk. She was trialed on Lopressor 12.67m BID which helped the sensation of chest "pounding" with the chest pain, but did not really eliminate the episodes. She called in reporting continued symptoms and I added her onto my clinic. She continues to affirm the above symptoms, about 2-3 times a week. There have been a few nights where it wakes her out of sleep. She continues to smoke. She has noticed DOE over the last few months. No LEE. She has family history of CAD/CABG in father in his 632s She feels well today without complaint - no dizziness, lightheadedness, acute CP or SOB.  Past Medical History:  Diagnosis Date  . Anxiety   . [place]    [place]  . [place]     diet control 1 year ago  . GERD (gastroesophageal reflux disease)   . Thyroid disease     Past Surgical History:  Procedure Laterality Date  . COLONOSCOPY WITH PROPOFOL N/A 08/27/2017   Procedure: COLONOSCOPY WITH  PROPOFOL;  Surgeon: RRogene Houston MD;  [facility];  Service: Endoscopy;  Laterality: N/A;  . COSMETIC SURGERY    . TUBAL LIGATION      Current Medications: Current Meds  Medication Sig  . albuterol (PROVENTIL) (2.5 MG/3ML) 0.083% nebulizer solution Take 3 mLs (2.5 mg total) by nebulization every 6 (six) hours as needed for wheezing or shortness of breath.  .Marland Kitchenaspirin 325 MG EC tablet Take 325 mg by mouth daily.  .Marland Kitchenatorvastatin (LIPITOR) 40 MG tablet Take 1 tablet (40 mg total) by mouth daily.  . blood glucose meter kit and supplies Dispense based on patient and insurance preference. Use up to four times daily as directed. (FOR ICD-10 E10.9, E11.9).  . busPIRone (BUSPAR) 30 MG tablet Take by mouth.  . Carboxymethylcellul-Glycerin (LUBRICATING EYE DROPS OP) Place 1 drop into both eyes daily as needed (dry eyes).  . citalopram (CELEXA) 40 MG tablet Take 40 mg by mouth daily.  . cyclobenzaprine (FLEXERIL) 10 MG tablet Take 10 mg by mouth 3 (three) times daily.  . diphenhydrAMINE (BENADRYL) 25 MG tablet Take 50 mg by mouth daily.  . furosemide (LASIX) 20 MG tablet Take 1 tablet (20 mg total) by mouth daily.  .Marland Kitchenglucose blood (ACCU-CHEK AVIVA PLUS) test strip Use to check blood sugars up to 4 times daily  . HYDROcodone-acetaminophen (NORCO) 10-325 MG tablet Take 1 tablet by mouth 3 times daily  . hydrOXYzine (ATARAX/VISTARIL) 50 MG  tablet Take by mouth.  . levothyroxine (SYNTHROID, LEVOTHROID) 100 MCG tablet Take 1 tablet (100 mcg total) by mouth daily before breakfast.  . meclizine (ANTIVERT) 25 MG tablet Take 1 tablet (25 mg total) by mouth 3 (three) times daily as needed for dizziness.  . metoprolol tartrate (LOPRESSOR) 25 MG tablet Take 0.5 tablets (12.5 mg total) by mouth 2 (two) times daily.  . Multiple Vitamin (MULTIVITAMIN WITH MINERALS) TABS tablet Take 1 tablet by mouth daily.  Marland Kitchen omeprazole (PRILOSEC) 20 MG capsule Take 1 capsule (20 mg total) by mouth daily.  Marland Kitchen PROVENTIL  HFA 108 (90 Base) MCG/ACT inhaler USE 2 PUFFS EVERY 6 HOURS AS NEEDED FOR WHEEZING AND SHORTNESS OF BREATH    Allergies:   Abilify [aripiprazole] and Seroquel [quetiapine]   Social History   Socioeconomic History  . Marital status: Divorced    Spouse name: Not on file  . Number of children: Not on file  . Years of education: Not on file  . Highest education level: Not on file  Occupational History  . Not on file  Social Needs  . Financial resource strain: Not on file  . Food insecurity:    Worry: Not on file    Inability: Not on file  . Transportation needs:    Medical: Not on file    Non-medical: Not on file  Tobacco Use  . Smoking status: Current Every Day Smoker    Packs/day: 1.00    Types: Cigarettes  . Smokeless tobacco: Never Used  Substance and Sexual Activity  . Alcohol use: No    Frequency: Never  . Drug use: No  . Sexual activity: Not on file  Lifestyle  . Physical activity:    Days per week: Not on file    Minutes per session: Not on file  . Stress: Not on file  Relationships  . Social connections:    Talks on phone: Not on file    Gets together: Not on file    Attends religious service: Not on file    Active member of club or organization: Not on file    Attends meetings of clubs or organizations: Not on file    Relationship status: Not on file  Other Topics Concern  . Not on file  Social History Narrative  . Not on file     Family History:  The patient's family history includes Brain cancer in her mother; Heart attack in her father and mother.  ROS:   Please see the history of present illness.  All other systems are reviewed and otherwise negative.    PHYSICAL EXAM:   VS:  BP 110/80   Pulse 85   Ht _0  (1.676 m)   Wt 208 lb (94.3 kg)   SpO2 98%   BMI 33.57 kg/m   BMI: Body mass index is 33.57 kg/m. GEN: Well nourished, well developed WF, in no acute distress HEENT: normocephalic, atraumatic Neck: no JVD, carotid bruits, or  masses Cardiac: RRR; no murmurs, rubs, or gallops, no edema  Respiratory:  clear to auscultation bilaterally, normal work of breathing GI: soft, nontender, nondistended, + BS MS: no deformity or atrophy Skin: warm and dry, no rash Neuro:  Alert and Oriented x 3, Strength and sensation are intact, follows commands Psych: euthymic mood, full affect  Wt Readings from Last 3 Encounters:  11/24/17 208 lb (94.3 kg)  10/13/17 206 lb (93.4 kg)  09/11/17 204 lb (92.5 kg)      Studies/Labs Reviewed:  EKG:  EKG was ordered today and personally reviewed by me and demonstrates EKG NSR no acute changes Recent Labs: 08/06/2017: BUN 14; Creatinine, Ser 0.64; Potassium 4.6; Sodium 140 08/25/2017: Hemoglobin 13.8; Platelets 277 09/08/2017: ALT 41   Lipid Panel    Component Value Date/Time   CHOL 256 (H) 07/02/2017 1012   TRIG 216 (H) 07/02/2017 1012   HDL 47 07/02/2017 1012   CHOLHDL 5.4 (H) 07/02/2017 1012   LDLCALC 166 (H) 07/02/2017 1012    Additional studies/ records that were reviewed today include: Summarized above    ASSESSMENT & PLAN:   1. Chest pain/dyspnea/abnormal stress test concerning for angina pectoris - she only had mild improvement with Lopressor. Has multiple cardiac risk factors. D/w Dr. Johnsie Cancel (DOD). Recommend cath for definitive evaluation. Risks and benefits of cardiac catheterization have been discussed with the patient.  These include bleeding, infection, kidney damage, stroke, heart attack, death. The patient understands these risks and is willing to proceed. Will plan on this Friday. Check labs in AM as lab is closed - will obtain CMET, CBC, TSH, lipid profile. Add Imdur 68m nightly. Decrease ASA to 848mdaily. Warning sx/ER precautions reviewed - she knows to seek medical attention if symptoms recur or escalate. 2. Diabetes in nonobese - she states this is diet controlled. 3. Hyperlipidemia - LDL 166 in 06/2017 prior to initiation of statin therapy. Will recheck  lipids when she returns fasting.  Disposition: F/u with Dr. BrHarl Bowies scheduled after cath 12/15/17.   Medication Adjustments/Labs and Tests Ordered: Current medicines are reviewed at length with the patient today.  Concerns regarding medicines are outlined above. Medication changes, Labs and Tests ordered today are summarized above and listed in the Patient Instructions accessible in Encounters.   Signed, DaCharlie PitterPA-C  11/24/2017 4:22 PM    [place] [id number]: [phone] [phone]

## 2017-11-24 NOTE — Telephone Encounter (Signed)
Called pt. She will come in today @ 4 to discuss.

## 2017-11-25 ENCOUNTER — Other Ambulatory Visit (HOSPITAL_COMMUNITY)
Admission: RE | Admit: 2017-11-25 | Discharge: 2017-11-25 | Disposition: A | Discharge status: Home / Self Care | Payer: Medicaid Other | Source: Ambulatory Visit | Attending: Physician Assistant | Admitting: Physician Assistant

## 2017-11-25 ENCOUNTER — Telehealth: Payer: Self-pay

## 2017-11-25 ENCOUNTER — Other Ambulatory Visit: Payer: Self-pay | Admitting: Physician Assistant

## 2017-11-25 DIAGNOSIS — R0602 Shortness of breath: Secondary | ICD-10-CM | POA: Insufficient documentation

## 2017-11-25 DIAGNOSIS — R079 Chest pain, unspecified: Secondary | ICD-10-CM | POA: Diagnosis present

## 2017-11-25 LAB — LIPID PANEL
Cholesterol: 159 mg/dL (ref 0–200)
HDL: 47 mg/dL (ref >40)
LDL CALC: 68 mg/dL (ref 0–99)
Total CHOL/HDL Ratio: 3.4 RATIO
Triglycerides: 220 mg/dL — ABNORMAL HIGH (ref <150)
VLDL: 44 mg/dL — AB (ref 0–40)

## 2017-11-25 LAB — COMPREHENSIVE METABOLIC PANEL
ALBUMIN: 4.2 g/dL (ref 3.5–5.0)
ALT: 89 U/L — AB (ref 14–54)
AST: 71 U/L — AB (ref 15–41)
Alkaline Phosphatase: 91 U/L (ref 38–126)
Anion gap: 9 (ref 5–15)
BUN: 16 mg/dL (ref 6–20)
CO2: 23 mmol/L (ref 22–32)
CREATININE: 0.95 mg/dL (ref 0.44–1.00)
Calcium: 9.6 mg/dL (ref 8.9–10.3)
Chloride: 105 mmol/L (ref 101–111)
GFR calc non Af Amer: >60 mL/min (ref >60)
GLUCOSE: 101 mg/dL — AB (ref 65–99)
Potassium: 4.1 mmol/L (ref 3.5–5.1)
SODIUM: 137 mmol/L (ref 135–145)
Total Bilirubin: 0.7 mg/dL (ref 0.3–1.2)
Total Protein: 7.9 g/dL (ref 6.5–8.1)

## 2017-11-25 LAB — CBC WITH DIFFERENTIAL/PLATELET
BASOS ABS: 0 10*3/uL (ref 0.0–0.1)
Basophils Relative: 0 %
EOS ABS: 0.3 10*3/uL (ref 0.0–0.7)
Eosinophils Relative: 2 %
HCT: 40 % (ref 36.0–46.0)
HEMOGLOBIN: 13.3 g/dL (ref 12.0–15.0)
LYMPHS ABS: 3.9 10*3/uL (ref 0.7–4.0)
Lymphocytes Relative: 34 %
MCH: 29.8 pg (ref 26.0–34.0)
MCHC: 33.3 g/dL (ref 30.0–36.0)
MCV: 89.5 fL (ref 78.0–100.0)
Monocytes Absolute: 0.7 10*3/uL (ref 0.1–1.0)
Monocytes Relative: 6 %
NEUTROS PCT: 58 %
Neutro Abs: 6.6 10*3/uL (ref 1.7–7.7)
Platelets: 314 10*3/uL (ref 150–400)
RBC: 4.47 MIL/uL (ref 3.87–5.11)
RDW: 12.7 % (ref 11.5–15.5)
WBC: 11.5 10*3/uL — ABNORMAL HIGH (ref 4.0–10.5)

## 2017-11-25 LAB — TSH: TSH: 0.326 u[IU]/mL — ABNORMAL LOW (ref 0.350–4.500)

## 2017-11-25 NOTE — Telephone Encounter (Signed)
Pt has transportation issuses, re-scheduled cath to 5/28 8 am arrival for 10 am cath with Dr Ellyn Hack

## 2017-11-26 ENCOUNTER — Telehealth: Payer: Self-pay | Admitting: *Deleted

## 2017-11-26 NOTE — Telephone Encounter (Signed)
-----   Message from [place] sent at 11/26/2017  8:24 AM EDT -----   ----- Message ----- From: Charlie Pitter, PA-C Sent: 11/25/2017   5:14 PM To: Butch Penny, NP, Sharion Balloon, FNP, #  Please let patient know pre-cath labs are acceptable for cardiac cath. There are a few findings noted in her labs that mostly need internal medicine followup  - LDL much better - white blood cell count remains mildly elevated, will need further monitoring/eval by PCP -> will cc to PCP - TSH is mildly suppressed possibly indicating she has too much thyroid hormone on board and dose may need adjustment at discretion by PCP -> will cc to PCP - liver function tests seem intermittently elevated, even before recent statin initiation by PCP in 06/2017. Has seen GI for elevated LFTs who also had planned to repeat, but this occurred in the interim - will route to NP [facility] for review/input  Melina Copa PA-C

## 2017-11-26 NOTE — Telephone Encounter (Signed)
Pt aware and voiced understanding - routed to pcp  

## 2017-11-29 ENCOUNTER — Encounter: Payer: Self-pay | Admitting: Family Medicine

## 2017-11-30 ENCOUNTER — Other Ambulatory Visit: Payer: Self-pay | Admitting: Family

## 2017-11-30 MED ORDER — LEVOTHYROXINE SODIUM 88 MCG PO TABS: 88.0000 ug | ORAL_TABLET | Freq: Every day | ORAL | 11 refills | Status: DC

## 2017-12-02 ENCOUNTER — Telehealth: Payer: Self-pay

## 2017-12-02 NOTE — Telephone Encounter (Signed)
Patient contacted pre-catheterization at [facility] scheduled for:  12/06/2017 @ 10:30 am Verified arrival time and place:  [facility] @ 0800 Confirmed AM meds to be taken pre-cath with sip of water: Take ASA Hold Lasix Confirmed patient has responsible person to drive home post procedure and observe patient for 24 hours:  yes Addl concerns:  none

## 2017-12-03 ENCOUNTER — Ambulatory Visit: Payer: Medicaid Other | Admitting: Diagnostic Neuroimaging

## 2017-12-06 ENCOUNTER — Telehealth: Payer: Self-pay | Admitting: Cardiology

## 2017-12-06 NOTE — Telephone Encounter (Signed)
Cath rescheduled to January 03, 2018

## 2017-12-06 NOTE — Telephone Encounter (Signed)
Cath scheduled January 03, 2018

## 2017-12-06 NOTE — Telephone Encounter (Signed)
Had to cancel cath due to family emergency.   She asked that some one call her in reference to recheduling her cath and her follow with Dr Harl Bowie

## 2017-12-06 NOTE — Telephone Encounter (Signed)
Pt already spoke with cath lab to reschedule this morning

## 2017-12-14 ENCOUNTER — Telehealth: Payer: Self-pay | Admitting: Physician Assistant

## 2017-12-14 NOTE — Telephone Encounter (Signed)
I went to f/u because I did not yet receive cath results on pt and see she has deferred. Given her symptoms would recommend she reconsider postponing and schedule sooner. Due to her postponing this she will also need repeat OV with extender or MD and also updated labs because she will be out of 30 day window. Please make her aware and schedule. Damiean Lukes PA-C

## 2017-12-15 ENCOUNTER — Ambulatory Visit: Payer: Medicaid Other | Admitting: Cardiology

## 2017-12-15 ENCOUNTER — Telehealth: Payer: Self-pay | Admitting: Cardiology

## 2017-12-15 NOTE — Telephone Encounter (Signed)
Returning call, can be reached @ [phone]

## 2017-12-15 NOTE — Telephone Encounter (Signed)
Per cath lab, nothing available tomorrow.  Rescheduled for Friday, 12/17/2017 at 12:00.  Need to arrive at 10:00 am to short stay.  Left message to return call.

## 2017-12-15 NOTE — Telephone Encounter (Signed)
Patient states she needs to reschedule cath / tg

## 2017-12-15 NOTE — Telephone Encounter (Signed)
Patient notified.  Advised to go to ED (preferably [facility]) if symptoms worsen if she feels she can do so safely.  She will go to closest ED if emergent which would be [facility].  She verbalized understanding.

## 2017-12-15 NOTE — Telephone Encounter (Signed)
Pt is wanting to know if she can have her cath done tomorrow?   States she's having pain down her lt arm into her side, no CP, states she has SOB all the time can be reached @ this # [phone]

## 2017-12-16 ENCOUNTER — Telehealth: Payer: Self-pay | Admitting: *Deleted

## 2017-12-16 NOTE — Telephone Encounter (Signed)
Cath scheduled for 12/17/17

## 2017-12-16 NOTE — Telephone Encounter (Signed)
Pt contacted pre-catheterization scheduled at [facility] for: Friday December 17, 2017 12 noon Verified arrival time and place: [place] Entrance A at: 10 AM  No solid food after midnight prior to cath, clear liquids until 5 AM day of procedure. Verified allergies in Epic Verified no diabetes medications.  Hold:  Furosemide AM of procedure  Except hold medications AM meds can be  taken pre-cath with sip of water including: ASA 81 mg  Confirmed patient has responsible person to drive home post procedure and observe patient for 24 hours: yes

## 2017-12-16 NOTE — Telephone Encounter (Signed)
See previous telephone note. 

## 2017-12-17 ENCOUNTER — Encounter (HOSPITAL_COMMUNITY): Payer: Self-pay | Admitting: Cardiology

## 2017-12-17 ENCOUNTER — Encounter (HOSPITAL_COMMUNITY)
Admission: RE | Disposition: A | Discharge status: Home / Self Care | Payer: Self-pay | Source: Ambulatory Visit | Attending: Cardiology

## 2017-12-17 ENCOUNTER — Ambulatory Visit (HOSPITAL_COMMUNITY)
Admission: RE | Admit: 2017-12-17 | Discharge: 2017-12-17 | Disposition: A | Discharge status: Home / Self Care | Payer: Medicaid Other | Source: Ambulatory Visit | Attending: Cardiology | Admitting: Cardiology

## 2017-12-17 DIAGNOSIS — E785 Hyperlipidemia, unspecified: Secondary | ICD-10-CM | POA: Diagnosis not present

## 2017-12-17 DIAGNOSIS — Z7982 Long term (current) use of aspirin: Secondary | ICD-10-CM | POA: Insufficient documentation

## 2017-12-17 DIAGNOSIS — R079 Chest pain, unspecified: Secondary | ICD-10-CM

## 2017-12-17 DIAGNOSIS — Z79899 Other long term (current) drug therapy: Secondary | ICD-10-CM | POA: Insufficient documentation

## 2017-12-17 DIAGNOSIS — R072 Precordial pain: Secondary | ICD-10-CM | POA: Diagnosis present

## 2017-12-17 DIAGNOSIS — K219 Gastro-esophageal reflux disease without esophagitis: Secondary | ICD-10-CM | POA: Diagnosis not present

## 2017-12-17 DIAGNOSIS — F419 Anxiety disorder, unspecified: Secondary | ICD-10-CM | POA: Diagnosis not present

## 2017-12-17 DIAGNOSIS — Z8249 Family history of ischemic heart disease and other diseases of the circulatory system: Secondary | ICD-10-CM | POA: Diagnosis not present

## 2017-12-17 DIAGNOSIS — E669 Obesity, unspecified: Secondary | ICD-10-CM | POA: Insufficient documentation

## 2017-12-17 DIAGNOSIS — Z6833 Body mass index (BMI) 33.0-33.9, adult: Secondary | ICD-10-CM | POA: Diagnosis not present

## 2017-12-17 DIAGNOSIS — Z9851 Tubal ligation status: Secondary | ICD-10-CM | POA: Insufficient documentation

## 2017-12-17 DIAGNOSIS — Z9889 Other specified postprocedural states: Secondary | ICD-10-CM | POA: Insufficient documentation

## 2017-12-17 DIAGNOSIS — E119 Type 2 diabetes mellitus without complications: Secondary | ICD-10-CM | POA: Insufficient documentation

## 2017-12-17 DIAGNOSIS — I219 Acute myocardial infarction, unspecified: Secondary | ICD-10-CM | POA: Insufficient documentation

## 2017-12-17 DIAGNOSIS — F17210 Nicotine dependence, cigarettes, uncomplicated: Secondary | ICD-10-CM | POA: Diagnosis not present

## 2017-12-17 DIAGNOSIS — E079 Disorder of thyroid, unspecified: Secondary | ICD-10-CM | POA: Insufficient documentation

## 2017-12-17 DIAGNOSIS — R0789 Other chest pain: Secondary | ICD-10-CM | POA: Insufficient documentation

## 2017-12-17 DIAGNOSIS — Z79890 Hormone replacement therapy (postmenopausal): Secondary | ICD-10-CM | POA: Insufficient documentation

## 2017-12-17 HISTORY — PX: LEFT HEART CATH AND CORONARY ANGIOGRAPHY: CATH118249

## 2017-12-17 HISTORY — DX: Precordial pain: R07.2

## 2017-12-17 LAB — GLUCOSE, CAPILLARY: GLUCOSE-CAPILLARY: 108 mg/dL — AB (ref 65–99)

## 2017-12-17 SURGERY — LEFT HEART CATH AND CORONARY ANGIOGRAPHY
Anesthesia: LOCAL

## 2017-12-17 MED ORDER — SODIUM CHLORIDE 0.9% FLUSH: 3.0000 mL | Freq: Two times a day (BID) | INTRAVENOUS | Status: DC

## 2017-12-17 MED ORDER — SODIUM CHLORIDE 0.9 % WEIGHT BASED INFUSION: 1.0000 mL/kg/h | INTRAVENOUS | Status: DC

## 2017-12-17 MED ORDER — FENTANYL CITRATE (PF) 100 MCG/2ML IJ SOLN
INTRAMUSCULAR | Status: DC | PRN
  Administered 2017-12-17 (×3): 25 ug via INTRAVENOUS

## 2017-12-17 MED ORDER — HEPARIN (PORCINE) IN NACL 1000-0.9 UT/500ML-% IV SOLN
INTRAVENOUS | Status: AC
  Filled 2017-12-17: qty 1000

## 2017-12-17 MED ORDER — HEPARIN SODIUM (PORCINE) 1000 UNIT/ML IJ SOLN
INTRAMUSCULAR | Status: AC
  Filled 2017-12-17: qty 1

## 2017-12-17 MED ORDER — MIDAZOLAM HCL 2 MG/2ML IJ SOLN
INTRAMUSCULAR | Status: AC
  Filled 2017-12-17: qty 2

## 2017-12-17 MED ORDER — HEPARIN SODIUM (PORCINE) 1000 UNIT/ML IJ SOLN
INTRAMUSCULAR | Status: DC | PRN
  Administered 2017-12-17: 5000 [IU] via INTRAVENOUS

## 2017-12-17 MED ORDER — ACETAMINOPHEN 325 MG PO TABS: 650.0000 mg | ORAL_TABLET | ORAL | Status: DC | PRN

## 2017-12-17 MED ORDER — SODIUM CHLORIDE 0.9 % WEIGHT BASED INFUSION
3.0000 mL/kg/h | INTRAVENOUS | Status: AC
  Administered 2017-12-17: 3 mL/kg/h via INTRAVENOUS

## 2017-12-17 MED ORDER — IOHEXOL 350 MG/ML SOLN
INTRAVENOUS | Status: DC | PRN
  Administered 2017-12-17: 30 mL via INTRA_ARTERIAL

## 2017-12-17 MED ORDER — SODIUM CHLORIDE 0.9 % IV SOLN: 250.0000 mL | INTRAVENOUS | Status: DC | PRN

## 2017-12-17 MED ORDER — ASPIRIN 81 MG PO CHEW: 81.0000 mg | CHEWABLE_TABLET | ORAL | Status: DC

## 2017-12-17 MED ORDER — FENTANYL CITRATE (PF) 100 MCG/2ML IJ SOLN
INTRAMUSCULAR | Status: AC
  Filled 2017-12-17: qty 2

## 2017-12-17 MED ORDER — SODIUM CHLORIDE 0.9 % IV SOLN
250.0000 mL | INTRAVENOUS | Status: DC | PRN
Start: 2017-12-17 — End: 2017-12-17

## 2017-12-17 MED ORDER — SODIUM CHLORIDE 0.9% FLUSH: 3.0000 mL | INTRAVENOUS | Status: DC | PRN

## 2017-12-17 MED ORDER — ONDANSETRON HCL 4 MG/2ML IJ SOLN: 4.0000 mg | Freq: Four times a day (QID) | INTRAMUSCULAR | Status: DC | PRN

## 2017-12-17 MED ORDER — VERAPAMIL HCL 2.5 MG/ML IV SOLN
INTRAVENOUS | Status: DC | PRN
  Administered 2017-12-17: 10 mL via INTRA_ARTERIAL

## 2017-12-17 MED ORDER — LIDOCAINE HCL (PF) 1 % IJ SOLN
INTRAMUSCULAR | Status: AC
  Filled 2017-12-17: qty 30

## 2017-12-17 MED ORDER — VERAPAMIL HCL 2.5 MG/ML IV SOLN
INTRAVENOUS | Status: AC
  Filled 2017-12-17: qty 2

## 2017-12-17 MED ORDER — HEPARIN (PORCINE) IN NACL 2-0.9 UNITS/ML
INTRAMUSCULAR | Status: AC | PRN
  Administered 2017-12-17 (×2): 500 mL

## 2017-12-17 MED ORDER — MIDAZOLAM HCL 2 MG/2ML IJ SOLN
INTRAMUSCULAR | Status: DC | PRN
  Administered 2017-12-17 (×3): 1 mg via INTRAVENOUS

## 2017-12-17 MED ORDER — LIDOCAINE HCL (PF) 1 % IJ SOLN
INTRAMUSCULAR | Status: DC | PRN
  Administered 2017-12-17: 2 mL

## 2017-12-17 SURGICAL SUPPLY — 12 items
CATH 5FR JL3.5 JR4 ANG PIG MP (CATHETERS) ×2 IMPLANT
COVER PRB 48X5XTLSCP FOLD TPE (BAG) ×1 IMPLANT
COVER PROBE 5X48 (BAG) ×1
DEVICE RAD COMP TR BAND LRG (VASCULAR PRODUCTS) ×2 IMPLANT
GUIDEWIRE INQWIRE 1.5J.035X260 (WIRE) ×1 IMPLANT
INQWIRE 1.5J .035X260CM (WIRE) ×2
KIT HEART LEFT (KITS) ×2 IMPLANT
NEEDLE PERC 21GX4CM (NEEDLE) ×2 IMPLANT
PACK CARDIAC CATHETERIZATION (CUSTOM PROCEDURE TRAY) ×2 IMPLANT
SHEATH RAIN RADIAL 21G 6FR (SHEATH) ×2 IMPLANT
TRANSDUCER W/STOPCOCK (MISCELLANEOUS) ×2 IMPLANT
TUBING CIL FLEX 10 FLL-RA (TUBING) ×2 IMPLANT

## 2017-12-17 NOTE — Research (Signed)
CADFEM Informed Consent   Subject Name: Rachael Watson  Subject met inclusion and exclusion criteria.  The informed consent form, study requirements and expectations were reviewed with the subject and questions and concerns were addressed prior to the signing of the consent form.  The subject verbalized understanding of the trail requirements.  The subject agreed to participate in the CADFEM trial and signed the informed consent.  The informed consent was obtained prior to performance of any protocol-specific procedures for the subject.  A copy of the signed informed consent was given to the subject and a copy was placed in the subject's medical record.  Neva Seat 12/17/2017, 11:00 AM

## 2017-12-17 NOTE — Interval H&P Note (Signed)
History and Physical Interval Note:  12/17/2017 11:52 AM  Rachael Watson  has presented today for surgery, with the diagnosis of abnormal nuc - cp  The various methods of treatment have been discussed with the patient and family. After consideration of risks, benefits and other options for treatment, the patient has consented to  Procedure(s): LEFT HEART CATH AND CORONARY ANGIOGRAPHY (N/A) as a surgical intervention .  The patient's history has been reviewed, patient examined, no change in status, stable for surgery.  I have reviewed the patient's chart and labs.  Questions were answered to the patient's satisfaction.   Cath Lab Visit (complete for each Cath Lab visit)  Clinical Evaluation Leading to the Procedure:   ACS: No.  Non-ACS:    Anginal Classification: CCS IV  Anti-ischemic medical therapy: Maximal Therapy (2 or more classes of medications)  Non-Invasive Test Results: Low-risk stress test findings: cardiac mortality <1%/year  Prior CABG: No previous CABG        Rachael Watson [facility] 12/17/2017 11:52 AM

## 2017-12-17 NOTE — Discharge Instructions (Signed)

## 2017-12-20 MED FILL — Heparin Sod (Porcine)-NaCl IV Soln 1000 Unit/500ML-0.9%: INTRAVENOUS | Qty: 1000 | Status: AC

## 2017-12-21 ENCOUNTER — Encounter: Payer: Medicaid Other | Admitting: Podiatry

## 2017-12-21 DIAGNOSIS — M79676 Pain in unspecified toe(s): Secondary | ICD-10-CM

## 2017-12-21 NOTE — Progress Notes (Signed)
Patient presents today for callus trim only and she did not want her nails trimmed and she apparently had no other issues. She was informed of the cost for callus trim as this is a non-covered service with her insurance. She got upset at that and left. I did not get to evaluate her. We are more than happy to treat her however this is a non-covered service and she did not want to pay for it. I did discuss this with Jocelyn Lamer from our billing office and confirmed this.   Trula Slade DPM

## 2017-12-27 ENCOUNTER — Other Ambulatory Visit: Payer: Self-pay | Admitting: Family Medicine

## 2017-12-28 ENCOUNTER — Other Ambulatory Visit: Payer: Self-pay | Admitting: *Deleted

## 2017-12-28 ENCOUNTER — Telehealth: Payer: Self-pay | Admitting: *Deleted

## 2017-12-28 NOTE — Telephone Encounter (Signed)
Pt is no longer a diabetic. Her blood sugars in the office and her A1C have all been normal. I can not write her a rx of insulin.

## 2017-12-28 NOTE — Telephone Encounter (Signed)
I can not write rx. Pt's glucose has been normal. We can check A1C again on next visit.

## 2017-12-28 NOTE — Telephone Encounter (Signed)
Left message for patient to call back. Patient is not diabetic and does not need insulin at this time.  Per Evelina Dun, FNP.  Has patient seen endocrinology

## 2017-12-28 NOTE — Telephone Encounter (Signed)
Pt needs refill on her Humalog Per the copies of her records from [facility] Humalog U-100 Insulin 100 unit/ml Use per sliding scale/prn - 2 U if BGL is >150, 4 U if >200, 6 U if >250, 8 U if >300, 10 U if >350, 12 U if > than 400 - if BGL >400 seek emergent care/notify PCP

## 2017-12-28 NOTE — Telephone Encounter (Signed)
Called and informed patient.  Patient states that her BS has been over 200s at least 3-4 times in the last 2 months

## 2018-01-03 ENCOUNTER — Telehealth: Payer: Self-pay | Admitting: Family

## 2018-01-03 DIAGNOSIS — R739 Hyperglycemia, unspecified: Secondary | ICD-10-CM

## 2018-01-03 NOTE — Telephone Encounter (Signed)
Referral placed.

## 2018-01-03 NOTE — Telephone Encounter (Signed)
Please review and advise.

## 2018-01-04 NOTE — Telephone Encounter (Signed)
Left message for patient stating a referral was done in May.  She has an appointment July 3 at 8:30 with Elayne Snare at [facility].

## 2018-01-05 ENCOUNTER — Ambulatory Visit: Payer: Medicaid Other | Admitting: Endocrinology

## 2018-01-06 ENCOUNTER — Other Ambulatory Visit: Payer: Self-pay | Admitting: Family

## 2018-01-10 ENCOUNTER — Ambulatory Visit: Payer: Medicaid Other | Admitting: Family

## 2018-01-24 ENCOUNTER — Ambulatory Visit: Payer: Medicaid Other | Admitting: Cardiology

## 2018-01-24 ENCOUNTER — Encounter: Payer: Self-pay | Admitting: Endocrinology

## 2018-01-24 NOTE — Progress Notes (Deleted)
Clinical Summary Rachael Watson is a 52 y.o.female seen today as a new consult, referred for history of CHF.   1. CHF - this is a self reported history - she reports being managed for this by Dr Marya Landry in [place], [place] who has since retired and records are not available.  - she is unsure of the type of heart failure, or the current status.   -has had some recent LE edema. Can have some SOB/DOE.  - has been on lasix for roughly 10 years   2. Chest pain - feeling of tightness midchest, 7/10 in severity. Mainly with exertion. Can feel nauseous, sweaty. Can feel lightheaded, can feel like passing.  - tightness lasts seconds to minutes. Occurs sporadically - CAD risk factors: HL, tobacco, DM2 off meds  10/2017 echo LVEF 60-65%, no WMAs 11/2017 nuclear stress mild apical ischemia, low risk.  - after stress test ongoing symptoms, was referred for cath 12/2017 cath patent vessels, normal LVEDP    Past Medical History:  Diagnosis Date  . Anxiety   . Chest pain, precordial 12/17/2017  . CHF (congestive heart failure) ([place])    2009  . Diabetes mellitus without complication ([place])     diet control 1 year ago  . GERD (gastroesophageal reflux disease)   . Thyroid disease      Allergies  Allergen Reactions  . Abilify [Aripiprazole] Other (See Comments)    Violent behavior  . Risperidone Other (See Comments)    Unknown  . Seroquel [Quetiapine] Other (See Comments)    Tremors and unable to swallow     Current Outpatient Medications  Medication Sig Dispense Refill  . ACCU-CHEK FASTCLIX LANCETS MISC USE FOUR TIMES DAILY AS DIRECTED. 102 each 0  . albuterol (PROVENTIL) (2.5 MG/3ML) 0.083% nebulizer solution Take 3 mLs (2.5 mg total) by nebulization every 6 (six) hours as needed for wheezing or shortness of breath. 150 mL 1  . aspirin 81 MG tablet Take 1 tablet (81 mg total) by mouth daily. 90 tablet 3  . atorvastatin (LIPITOR) 40 MG tablet Take 1 tablet (40 mg total) by mouth  daily. 30 tablet 5  . busPIRone (BUSPAR) 10 MG tablet Take 10 mg by mouth 3 (three) times daily.     . citalopram (CELEXA) 40 MG tablet Take 40 mg by mouth daily.    . cyclobenzaprine (FLEXERIL) 10 MG tablet Take 10 mg by mouth 3 (three) times daily.    . diphenhydrAMINE (BENADRYL) 25 MG tablet Take 50 mg by mouth daily.    . furosemide (LASIX) 20 MG tablet Take 1 tablet (20 mg total) by mouth daily. 30 tablet 6  . HYDROcodone-acetaminophen (NORCO) 10-325 MG tablet Take 1 tablet by mouth 3 times daily  0  . hydrOXYzine (ATARAX/VISTARIL) 50 MG tablet Take 50 mg by mouth 2 (two) times daily as needed for anxiety.     . isosorbide mononitrate (IMDUR) 30 MG 24 hr tablet Take 0.5 tablets (15 mg total) by mouth at bedtime. 45 tablet 3  . levothyroxine (SYNTHROID) 88 MCG tablet Take 1 tablet (88 mcg total) by mouth daily. 30 tablet 11  . meclizine (ANTIVERT) 25 MG tablet Take 1 tablet (25 mg total) by mouth 3 (three) times daily. 60 tablet 2  . omeprazole (PRILOSEC) 20 MG capsule Take 1 capsule (20 mg total) by mouth daily. 90 capsule 1  . PROVENTIL HFA 108 (90 Base) MCG/ACT inhaler USE 2 PUFFS EVERY 6 HOURS AS NEEDED FOR WHEEZING AND SHORTNESS OF  BREATH 18 g 1   No current facility-administered medications for this visit.      Past Surgical History:  Procedure Laterality Date  . COLONOSCOPY WITH PROPOFOL N/A 08/27/2017   Procedure: COLONOSCOPY WITH PROPOFOL;  Surgeon: Rogene Houston, MD;  [facility];  Service: Endoscopy;  Laterality: N/A;  . COSMETIC SURGERY    . LEFT HEART CATH AND CORONARY ANGIOGRAPHY N/A 12/17/2017   Procedure: LEFT HEART CATH AND CORONARY ANGIOGRAPHY;  Surgeon: Martinique, Peter M, MD;  Location: [place] CV LAB;  Service: Cardiovascular;  Laterality: N/A;  . TUBAL LIGATION       Allergies  Allergen Reactions  . Abilify [Aripiprazole] Other (See Comments)    Violent behavior  . Risperidone Other (See Comments)    Unknown  . Seroquel [Quetiapine] Other  (See Comments)    Tremors and unable to swallow      Family History  Problem Relation Age of Onset  . Heart attack Mother   . Brain cancer Mother   . Heart attack Father      Social History Ms. Freeney reports that she has been smoking cigarettes.  She has been smoking about 1.00 pack per day. She has never used smokeless tobacco. Ms. Philipps reports that she does not drink alcohol.   Review of Systems CONSTITUTIONAL: No weight loss, fever, chills, weakness or fatigue.  HEENT: Eyes: No visual loss, blurred vision, double vision or yellow sclerae.No hearing loss, sneezing, congestion, runny nose or sore throat.  SKIN: No rash or itching.  CARDIOVASCULAR:  RESPIRATORY: No shortness of breath, cough or sputum.  GASTROINTESTINAL: No anorexia, nausea, vomiting or diarrhea. No abdominal pain or blood.  GENITOURINARY: No burning on urination, no polyuria NEUROLOGICAL: No headache, dizziness, syncope, paralysis, ataxia, numbness or tingling in the extremities. No change in bowel or bladder control.  MUSCULOSKELETAL: No muscle, back pain, joint pain or stiffness.  LYMPHATICS: No enlarged nodes. No history of splenectomy.  PSYCHIATRIC: No history of depression or anxiety.  ENDOCRINOLOGIC: No reports of sweating, cold or heat intolerance. No polyuria or polydipsia.  Marland Kitchen   Physical Examination There were no vitals filed for this visit. There were no vitals filed for this visit.  Gen: resting comfortably, no acute distress HEENT: no scleral icterus, pupils equal round and reactive, no palptable cervical adenopathy,  CV Resp: Clear to auscultation bilaterally GI: abdomen is soft, non-tender, non-distended, normal bowel sounds, no hepatosplenomegaly MSK: extremities are warm, no edema.  Skin: warm, no rash Neuro:  no focal deficits Psych: appropriate affect   Diagnostic Studies  10/2017 echo Study Conclusions  - Left ventricle: The cavity size was normal. Wall thickness was    increased in a pattern of mild LVH. Systolic function was normal.   The estimated ejection fraction was in the range of 60% to 65%.   Wall motion was normal; there were no regional wall motion   abnormalities. Left ventricular diastolic function parameters   were normal for the patient&'s age. - Aortic valve: Mildly calcified annulus. Trileaflet. There was   mild regurgitation. - Mitral valve: There was mild regurgitation. - Right atrium: Central venous pressure (est): 3 mm Hg. - Atrial septum: No defect or patent foramen ovale was identified. - Tricuspid valve: There was trivial regurgitation. - Pulmonary arteries: Systolic pressure could not be accurately   estimated. - Pericardium, extracardiac: There was no pericardial effusion.   11/2017 Nuclear stress  There was no ST segment deviation noted during stress.  No T wave inversion  was noted during stress.  Findings consistent with mild apical ischemia.  This is a low risk study.  The left ventricular ejection fraction is normal (55-65%).   12/2017 cath  LV end diastolic pressure is normal.   1. Normal coronary anatomy 2. Normal LVEDP  Plan: consider alternative causes of chest pain.   Assessment and Plan  1. CHF - this is a self reported history, details are unclear. She reports she has some records at home she will bring in - obtain echo to evaluate for underlying cardiac dysfunction   2. Chest pain - f/u echo results, pending results likely consider lexiscan   F/u pending testing results.        Arnoldo Lenis, M.D.

## 2018-01-25 ENCOUNTER — Encounter: Payer: Self-pay | Admitting: Cardiology

## 2018-01-31 ENCOUNTER — Telehealth: Payer: Self-pay | Admitting: Cardiology

## 2018-01-31 ENCOUNTER — Ambulatory Visit: Payer: Medicaid Other | Admitting: Diagnostic Neuroimaging

## 2018-01-31 ENCOUNTER — Encounter: Payer: Self-pay | Admitting: Diagnostic Neuroimaging

## 2018-01-31 VITALS — BP 108/84 | HR 78 | Ht 66.0 in | Wt 209.8 lb

## 2018-01-31 DIAGNOSIS — R4689 Other symptoms and signs involving appearance and behavior: Secondary | ICD-10-CM

## 2018-01-31 MED ORDER — FUROSEMIDE 40 MG PO TABS: 40.0000 mg | ORAL_TABLET | Freq: Every day | ORAL | 1 refills | Status: DC

## 2018-01-31 NOTE — Telephone Encounter (Signed)
Rx sent. Patient verbalized understanding.

## 2018-01-31 NOTE — Telephone Encounter (Signed)
Patient states her PCP lowered her lasix to 20 mg daily from 40 mg daily. Patient states she is having increased swelling in legs and feet and needs it back to 40 mg. Will forward to provider.

## 2018-01-31 NOTE — Patient Instructions (Addendum)
   ABNORMAL SPELLS - MRI brain - EEG  LOW BACK PAIN - likely related to muscle weakness and imbalance - start water therapy, and then consider physical therapy or personal trainer or other yoga or gym exercises

## 2018-01-31 NOTE — Telephone Encounter (Signed)
I would change her Rx to lasix 40mg  tablet. May take 1/2 (20mg ) or 1 tablet(40mg ) prn swelling.    Zandra Abts MD
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
## 2018-01-31 NOTE — Telephone Encounter (Signed)
Having medication problems would like to discuss changing dose

## 2018-02-03 ENCOUNTER — Ambulatory Visit (INDEPENDENT_AMBULATORY_CARE_PROVIDER_SITE_OTHER): Payer: Medicaid Other | Admitting: Orthopaedic Surgery

## 2018-02-03 ENCOUNTER — Encounter (INDEPENDENT_AMBULATORY_CARE_PROVIDER_SITE_OTHER): Payer: Self-pay | Admitting: Orthopaedic Surgery

## 2018-02-03 VITALS — BP 114/58 | HR 82 | Ht 66.0 in | Wt 208.0 lb

## 2018-02-03 DIAGNOSIS — M545 Low back pain, unspecified: Secondary | ICD-10-CM

## 2018-02-04 ENCOUNTER — Ambulatory Visit: Payer: Medicaid Other | Admitting: Family
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
## 2018-02-07 ENCOUNTER — Other Ambulatory Visit: Payer: Self-pay | Admitting: Family

## 2018-02-07 ENCOUNTER — Ambulatory Visit: Payer: Medicaid Other | Admitting: "Endocrinology

## 2018-02-08 ENCOUNTER — Encounter: Payer: Self-pay | Admitting: Family

## 2018-02-14 ENCOUNTER — Other Ambulatory Visit: Payer: Medicaid Other

## 2018-02-14 ENCOUNTER — Encounter: Payer: Self-pay | Admitting: Diagnostic Neuroimaging

## 2018-02-15 ENCOUNTER — Encounter: Payer: Self-pay | Admitting: Family

## 2018-02-15 ENCOUNTER — Ambulatory Visit: Payer: Medicaid Other | Admitting: Family

## 2018-02-15 VITALS — BP 109/71 | HR 88 | Temp 98.1°F | Ht 66.0 in | Wt 210.4 lb

## 2018-02-15 DIAGNOSIS — E663 Overweight: Secondary | ICD-10-CM | POA: Insufficient documentation

## 2018-02-15 DIAGNOSIS — F17200 Nicotine dependence, unspecified, uncomplicated: Secondary | ICD-10-CM

## 2018-02-15 DIAGNOSIS — E669 Obesity, unspecified: Secondary | ICD-10-CM | POA: Insufficient documentation

## 2018-02-15 DIAGNOSIS — N62 Hypertrophy of breast: Secondary | ICD-10-CM

## 2018-02-15 DIAGNOSIS — M159 Polyosteoarthritis, unspecified: Secondary | ICD-10-CM

## 2018-02-15 DIAGNOSIS — M150 Primary generalized (osteo)arthritis: Secondary | ICD-10-CM

## 2018-02-15 DIAGNOSIS — M5441 Lumbago with sciatica, right side: Secondary | ICD-10-CM

## 2018-02-15 DIAGNOSIS — M48061 Spinal stenosis, lumbar region without neurogenic claudication: Secondary | ICD-10-CM

## 2018-02-15 DIAGNOSIS — M5442 Lumbago with sciatica, left side: Secondary | ICD-10-CM | POA: Diagnosis not present

## 2018-02-15 DIAGNOSIS — G8929 Other chronic pain: Secondary | ICD-10-CM

## 2018-02-15 NOTE — Progress Notes (Signed)
   Subjective:    Patient ID: Rachael Watson, female    DOB: October 12, 1965, 52 y.o.   MRN: [id number]  Chief Complaint  Patient presents with  . referral for breast reduction    HPI PT presents to the office today requesting referral for breast reduction. Pt states she is followed by her chiropractor ( eight times a year) for chronic low back pain. She states it was recommended that she have a breast reduction.   Pt states she wears 40 DD. She has tried weight loss, but can not lose from her breast.   She states she has constant lower back pain of 8-9 out 10. She has bilateral leg numbness and weakness. She takes Norco and gabapentin with mild relief. Pt is followed by [facility] every month for this.    Review of Systems  Musculoskeletal: Positive for arthralgias and back pain.  All other systems reviewed and are negative.      Objective:   Physical Exam  Constitutional: She is oriented to person, place, and time. She appears well-developed and well-nourished. No distress.  HENT:  Head: Normocephalic.  Eyes: Pupils are equal, round, and reactive to light.  Neck: Normal range of motion. Neck supple. No thyromegaly present.  Cardiovascular: Normal rate, regular rhythm, normal heart sounds and intact distal pulses.  No murmur heard. Pulmonary/Chest: Effort normal and breath sounds normal. No respiratory distress. She has no wheezes.  Abdominal: Soft. Bowel sounds are normal. She exhibits no distension. There is no tenderness.  Musculoskeletal: She exhibits no edema or tenderness.  Full ROM of back, but has pain with flexion and extension  Neurological: She is alert and oriented to person, place, and time. She has normal reflexes. No cranial nerve deficit.  Skin: Skin is warm and dry.  Psychiatric: She has a normal mood and affect. Her behavior is normal. Judgment and thought content normal.  Vitals reviewed.     BP 109/71   Pulse 88   Temp 98.1 F (36.7 C) (Oral)   Ht 5\' 6"   (1.676 m)   Wt 210 lb 6.4 oz (95.4 kg)   BMI 33.96 kg/m      Assessment & Plan:  Rachael Watson comes in today with chief complaint of referral for breast reduction   Diagnosis and orders addressed:  1. Spinal stenosis of lumbar region, unspecified whether neurogenic claudication present - Ambulatory referral to Plastic Surgery  2. Obesity (BMI 30-39.9) - Ambulatory referral to Plastic Surgery  3. Current smoker Smoking cessation discussed  4. Chronic bilateral low back pain with bilateral sciatica - Ambulatory referral to Plastic Surgery  5. Primary osteoarthritis involving multiple joints - Ambulatory referral to Plastic Surgery  6. Large breasts - Ambulatory referral to Plastic Surgery   Referral to Plastic Surgery  RX given for arthritis gloves and socks   Follow up plan: 6 months    Evelina Dun, FNP

## 2018-02-15 NOTE — Patient Instructions (Signed)
Breast Reduction Breast reduction, also called reduction mammoplasty, is surgery to reduce the size of the breasts by removing fat, tissue, and excess skin. The goal of this procedure is to help relieve problems that are caused or made worse by large breasts, such as:  Poor posture.  Long-term (chronic) back and neck pain.  Difficulty exercising.  A rash on the skin under the breasts.  Breast pain.  Grooves in the shoulder from bra straps.  Difficulty with hygiene.  Psychological distress caused by large breasts.  You may get a breast reduction to enhance the appearance of the breasts or for a medical need. Tell a health care provider about:  Any allergies you have.  All medicines you are taking, including vitamins, herbs, eye drops, creams, and over-the-counter medicines.  Any problems you or family members have had with anesthetic medicines.  Any blood disorders you have.  Any surgeries you have had.  Any medical conditions you have.  Whether you are pregnant or may be pregnant. What are the risks? Generally, this is a safe procedure. However, problems may occur, including:  Infection.  Bleeding. Blood may pool near the incision area (hematoma), or blood clots may form.  Allergic reactions to medicines.  Damage to other structures or organs, such as the dark area around the nipple (areola).  Partial or total numbness in the nipple or breast. Sometimes feeling returns, but not always.  Inability to breastfeed.  Pneumonia.  What happens before the procedure? Medicines  Ask your health care provider about: ? Changing or stopping your regular medicines. This is especially important if you are taking diabetes medicines or blood thinners. ? Taking medicines such as aspirin and ibuprofen. These medicines can thin your blood. Do not take these medicines before your procedure if your health care provider instructs you not to.  You may be given antibiotic medicine  to help prevent infection. Staying hydrated Follow instructions from your health care provider about hydration, which may include:  Up to 2 hours before the procedure - you may continue to drink clear liquids, such as water, clear fruit juice, black coffee, and plain tea.  Eating and drinking restrictions Follow instructions from your health care provider about eating and drinking, which may include:  8 hours before the procedure - stop eating heavy meals or foods such as meat, fried foods, or fatty foods.  6 hours before the procedure - stop eating light meals or foods, such as toast or cereal.  6 hours before the procedure - stop drinking milk or drinks that contain milk.  2 hours before the procedure - stop drinking clear liquids.  General instructions  Stop taking vitamin E supplements 2 weeks before your procedure. Talk with your health care provider about stopping other supplements, herbs, and teas that you regularly take.  Ask your health care provider how your surgical site will be marked or identified.  You may have blood tests.  You may have an X-ray exam of the breasts (mammogram).  You may be asked to bathe using a germ-killing (antiseptic) soap before your procedure.  For at least 2 weeks before your procedure, do not use any products that contain nicotine or tobacco, such as cigarettes and e-cigarettes. These products can delay healing after surgery. If you need help quitting, ask your health care provider.  Plan to have someone take you home from the hospital or clinic.  If you will be going home right after the procedure, plan to have someone with you for   24 hours. What happens during the procedure?  To lower your risk of infection: ? Your health care team will wash or sanitize their hands. ? Your skin will be washed with soap.  An IV tube will be inserted into one of your veins.  You will be given a medicine to make you fall asleep (general anesthetic). You  may also be given a medicine to help you relax (sedative).  An incision will be made in each breast.  Fat, tissue, and excess skin will be removed from each breast.  Your breasts will be reshaped. Your nipples may be moved so they are centered on your smaller breasts.  Tubes will be placed in your breast tissue to drain fluid from your surgical area. The tubes will be removed 2-3 days after the surgery.  Your incisions will be closed with stitches (sutures) and covered with surgical tape. Your breasts will be covered with gauze and elastic bandages (dressings). The procedure may vary among health care providers and hospitals. What happens after the procedure?  Your blood pressure, heart rate, breathing rate, and blood oxygen level will be monitored until the medicines you were given have worn off.  You may continue to receive fluids and medicines through an IV tube.  You may have to wear compression stockings. These stockings help to prevent blood clots and reduce swelling in your legs.  You will continue to have tubes draining fluid from your surgical area.  You may be given a special bra to wear.  You may have breast pain. You will be given medicine to help relieve pain.  Do not drive for 24 hours if you were given a sedative. Summary  Breast reduction, also called reduction mammoplasty, is surgery to reduce the size of the breasts by removing fat, tissue, and excess skin.  Tubes will be placed in your breast tissue to drain fluid from your surgical area. The tubes will be removed 2-3 days after the surgery.  Your incisions will be closed with stitches (sutures) and covered with surgical tape. Your breasts will be covered with gauze and elastic bandages (dressings).  You may have breast pain. You will be given medicine to help relieve pain.  Plan to have someone take you home from the hospital or clinic. This information is not intended to replace advice given to you by your  health care provider. Make sure you discuss any questions you have with your health care provider. Document Released: 09/18/2008 Document Revised: 03/03/2016 Document Reviewed: 03/03/2016 Elsevier Interactive Patient Education  2017 [employer]  

## 2018-02-17 ENCOUNTER — Telehealth: Payer: Self-pay | Admitting: Family

## 2018-02-17 DIAGNOSIS — M792 Neuralgia and neuritis, unspecified: Secondary | ICD-10-CM

## 2018-02-17 DIAGNOSIS — M159 Polyosteoarthritis, unspecified: Secondary | ICD-10-CM

## 2018-02-17 DIAGNOSIS — M1990 Unspecified osteoarthritis, unspecified site: Secondary | ICD-10-CM | POA: Insufficient documentation

## 2018-02-17 NOTE — Telephone Encounter (Signed)
Script for stockings faxed.

## 2018-02-17 NOTE — Telephone Encounter (Signed)
Patient needs rx faxed to [facility] at [phone] for "compression socks" along with diagnosis code included on rx

## 2018-02-17 NOTE — Telephone Encounter (Signed)
RX ready to faxed

## 2018-02-20 ENCOUNTER — Ambulatory Visit
Admission: RE | Admit: 2018-02-20 | Discharge: 2018-02-20 | Disposition: A | Discharge status: Home / Self Care | Payer: Medicaid Other | Source: Ambulatory Visit | Attending: Diagnostic Neuroimaging | Admitting: Diagnostic Neuroimaging

## 2018-02-20 DIAGNOSIS — R4689 Other symptoms and signs involving appearance and behavior: Secondary | ICD-10-CM

## 2018-02-20 MED ORDER — GADOBENATE DIMEGLUMINE 529 MG/ML IV SOLN
20.0000 mL | Freq: Once | INTRAVENOUS | Status: AC | PRN
  Administered 2018-02-20: 20 mL via INTRAVENOUS

## 2018-02-21 ENCOUNTER — Ambulatory Visit: Payer: Medicaid Other | Admitting: Diagnostic Neuroimaging

## 2018-02-21 DIAGNOSIS — R55 Syncope and collapse: Secondary | ICD-10-CM | POA: Diagnosis not present

## 2018-02-21 DIAGNOSIS — R4689 Other symptoms and signs involving appearance and behavior: Secondary | ICD-10-CM

## 2018-02-22 ENCOUNTER — Telehealth: Payer: Self-pay | Admitting: *Deleted

## 2018-02-22 NOTE — Telephone Encounter (Signed)
Called mobile #, message stated the number is not in service.  Reached patient on home number and informed her that her MRI brain result is unremarkable. Advised her Dr Leta Baptist wants her to FU with PCP and cardiologist for syncope. She stated that her events "must be due to my PTSD, and my therapist told me that PTSD can make me black out".  She verbalized understanding, appreciation.

## 2018-02-28 ENCOUNTER — Telehealth: Payer: Self-pay

## 2018-02-28 NOTE — Telephone Encounter (Signed)
There is a note

## 2018-02-28 NOTE — Telephone Encounter (Signed)
Patient calling about Podiatry turning her down and she wants to know why because she has neuropathy but I dont see that we referred to podiatry?

## 2018-02-28 NOTE — Telephone Encounter (Signed)
PT has seen Podiatry in the past and had her nails trimmed, but was told her insurance would not cover callus removal. She got upset and left their office.

## 2018-03-10 ENCOUNTER — Other Ambulatory Visit: Payer: Self-pay | Admitting: Family

## 2018-03-10 MED ORDER — CYCLOSPORINE 0.05 % OP EMUL: 1.0000 [drp] | Freq: Two times a day (BID) | OPHTHALMIC | 1 refills | Status: DC

## 2018-03-10 NOTE — Telephone Encounter (Signed)
Pt needs restasis eye drops = uses BID  - this was a order from [place] and does not have a eye Dr here yet.  Can you send in med for her ?

## 2018-03-10 NOTE — Procedures (Signed)
   [facility]  EEG (ELECTROENCEPHALOGRAM) REPORT   STUDY DATE: 02/21/18 PATIENT NAME: Rachael Watson DOB: 10/16/1965 MRN: [id number]  ORDERING CLINICIAN: Andrey Spearman, MD   TECHNOLOGIST: Arelia Longest  TECHNIQUE: Electroencephalogram was recorded utilizing standard 10-20 system of lead placement and reformatted into average and bipolar montages.  RECORDING TIME: 20 minutes  ACTIVATION: hyperventilation and photic stimulation  CLINICAL INFORMATION: 52 year old female with syncope.  FINDINGS: Posterior dominant background rhythms, which attenuate with eye opening, ranging 9-11 hertz and 20-30 microvolts. No focal, lateralizing, epileptiform activity or seizures are seen. Patient recorded in the awake and drowsy state. EKG channel shows regular rhythm of 75-80 beats per minute.   IMPRESSION:   Normal EEG in the awake and drowsy states.    INTERPRETING PHYSICIAN:  Penni Bombard, MD Certified in Neurology, Neurophysiology and Neuroimaging  [facility] [address], [place], [place] [address] [phone]

## 2018-03-11 ENCOUNTER — Ambulatory Visit (INDEPENDENT_AMBULATORY_CARE_PROVIDER_SITE_OTHER): Payer: Medicaid Other | Admitting: Internal Medicine

## 2018-03-14 ENCOUNTER — Telehealth: Payer: Self-pay | Admitting: *Deleted

## 2018-03-14 NOTE — Telephone Encounter (Signed)
Left patient a detailed message on father's vm, with results, on voicemail (ok per DPR).  Provided our number to call back with any questions.  If patient calls back, we need to update her contact numbers.

## 2018-03-14 NOTE — Telephone Encounter (Signed)
-----   Message from Penni Bombard, MD sent at 03/10/2018  6:55 PM EDT ----- Normal EEG. Please call patient. Continue current plan. -VRP

## 2018-03-15 ENCOUNTER — Ambulatory Visit: Payer: Medicaid Other | Admitting: Endocrinology

## 2018-03-22 DIAGNOSIS — Z0289 Encounter for other administrative examinations: Secondary | ICD-10-CM

## 2018-03-23 ENCOUNTER — Other Ambulatory Visit: Payer: Self-pay | Admitting: Family

## 2018-03-28 ENCOUNTER — Ambulatory Visit: Payer: Medicaid Other | Admitting: Cardiology

## 2018-04-11 ENCOUNTER — Telehealth: Payer: Self-pay

## 2018-04-11 NOTE — Telephone Encounter (Signed)
x

## 2018-05-08 ENCOUNTER — Other Ambulatory Visit: Payer: Self-pay | Admitting: Family

## 2018-05-10 ENCOUNTER — Encounter

## 2018-05-10 ENCOUNTER — Ambulatory Visit: Payer: Medicaid Other | Admitting: Cardiology

## 2018-05-10 ENCOUNTER — Encounter: Payer: Self-pay | Admitting: Cardiology
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
## 2018-06-06 DIAGNOSIS — M79676 Pain in unspecified toe(s): Secondary | ICD-10-CM

## 2018-06-17 ENCOUNTER — Other Ambulatory Visit: Payer: Self-pay | Admitting: Family

## 2018-07-01 ENCOUNTER — Ambulatory Visit: Payer: Medicaid Other | Admitting: Cardiology
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
## 2018-07-07 ENCOUNTER — Ambulatory Visit (INDEPENDENT_AMBULATORY_CARE_PROVIDER_SITE_OTHER): Payer: Medicaid Other | Admitting: Internal Medicine

## 2018-07-18 ENCOUNTER — Encounter (INDEPENDENT_AMBULATORY_CARE_PROVIDER_SITE_OTHER): Payer: Self-pay | Admitting: Internal Medicine

## 2018-07-18 ENCOUNTER — Ambulatory Visit (INDEPENDENT_AMBULATORY_CARE_PROVIDER_SITE_OTHER): Payer: Medicaid Other | Admitting: Internal Medicine

## 2018-07-18 VITALS — BP 131/75 | HR 87 | Temp 97.8°F | Ht 66.0 in | Wt 213.1 lb

## 2018-07-18 DIAGNOSIS — R1011 Right upper quadrant pain: Secondary | ICD-10-CM | POA: Diagnosis not present

## 2018-07-18 NOTE — Patient Instructions (Signed)
US abdomen, Labs
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
## 2018-07-20 ENCOUNTER — Ambulatory Visit (HOSPITAL_COMMUNITY): Payer: Medicaid Other

## 2018-07-26 ENCOUNTER — Ambulatory Visit (HOSPITAL_COMMUNITY)
Admission: RE | Admit: 2018-07-26 | Discharge: 2018-07-26 | Disposition: A | Discharge status: Home / Self Care | Payer: Medicaid Other | Source: Ambulatory Visit | Attending: Internal Medicine | Admitting: Internal Medicine

## 2018-07-26 DIAGNOSIS — R1011 Right upper quadrant pain: Secondary | ICD-10-CM | POA: Diagnosis not present

## 2018-08-04 ENCOUNTER — Telehealth (INDEPENDENT_AMBULATORY_CARE_PROVIDER_SITE_OTHER): Payer: Self-pay | Admitting: Internal Medicine

## 2018-08-04 ENCOUNTER — Other Ambulatory Visit: Payer: Self-pay | Admitting: Cardiology

## 2018-08-04 DIAGNOSIS — R1011 Right upper quadrant pain: Secondary | ICD-10-CM

## 2018-08-04 NOTE — Telephone Encounter (Signed)
Hepatic function ordered.  ?

## 2018-08-08 ENCOUNTER — Ambulatory Visit: Payer: Medicaid Other | Admitting: Cardiology

## 2018-08-08 ENCOUNTER — Encounter: Payer: Self-pay | Admitting: Cardiology
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
## 2018-08-25 LAB — HEPATIC FUNCTION PANEL
AG Ratio: 1.7 (calc) (ref 1.0–2.5)
ALBUMIN MSPROF: 4.2 g/dL (ref 3.6–5.1)
ALT: 32 U/L — AB (ref 6–29)
AST: 25 U/L (ref 10–35)
Alkaline phosphatase (APISO): 97 U/L (ref 37–153)
Bilirubin, Direct: 0.1 mg/dL (ref 0.0–0.2)
GLOBULIN: 2.5 g/dL (ref 1.9–3.7)
Indirect Bilirubin: 0.3 mg/dL (calc) (ref 0.2–1.2)
TOTAL PROTEIN: 6.7 g/dL (ref 6.1–8.1)
Total Bilirubin: 0.4 mg/dL (ref 0.2–1.2)

## 2018-08-29 ENCOUNTER — Other Ambulatory Visit (INDEPENDENT_AMBULATORY_CARE_PROVIDER_SITE_OTHER): Payer: Self-pay | Admitting: *Deleted

## 2018-08-29 ENCOUNTER — Other Ambulatory Visit: Payer: Self-pay | Admitting: Family

## 2018-08-29 DIAGNOSIS — R1011 Right upper quadrant pain: Secondary | ICD-10-CM

## 2018-09-25 ENCOUNTER — Other Ambulatory Visit: Payer: Self-pay | Admitting: Family

## 2018-09-26 NOTE — Telephone Encounter (Signed)
Last seen 02/15/18.

## 2018-09-27 ENCOUNTER — Telehealth (INDEPENDENT_AMBULATORY_CARE_PROVIDER_SITE_OTHER): Payer: Medicaid Other | Admitting: Family Medicine

## 2018-09-27 ENCOUNTER — Other Ambulatory Visit: Payer: Self-pay

## 2018-09-27 DIAGNOSIS — M159 Polyosteoarthritis, unspecified: Secondary | ICD-10-CM

## 2018-09-27 DIAGNOSIS — M150 Primary generalized (osteo)arthritis: Secondary | ICD-10-CM | POA: Diagnosis not present

## 2018-09-27 DIAGNOSIS — J208 Acute bronchitis due to other specified organisms: Secondary | ICD-10-CM | POA: Diagnosis not present

## 2018-09-27 MED ORDER — ALBUTEROL SULFATE HFA 108 (90 BASE) MCG/ACT IN AERS: 2.0000 | INHALATION_SPRAY | Freq: Four times a day (QID) | RESPIRATORY_TRACT | 3 refills | Status: DC | PRN

## 2018-09-27 MED ORDER — BENZONATATE 100 MG PO CAPS: 100.0000 mg | ORAL_CAPSULE | Freq: Three times a day (TID) | ORAL | 0 refills | Status: DC | PRN

## 2018-09-27 MED ORDER — PREDNISONE 20 MG PO TABS: ORAL_TABLET | ORAL | 0 refills | Status: DC
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
## 2018-09-27 NOTE — Patient Instructions (Signed)
Acute Bronchitis, Adult Acute bronchitis is when air tubes (bronchi) in the lungs suddenly get swollen. The condition can make it hard to breathe. It can also cause these symptoms:  A cough.  Coughing up clear, yellow, or green mucus.  Wheezing.  Chest congestion.  Shortness of breath.  A fever.  Body aches.  Chills.  A sore throat. Follow these instructions at home:  Medicines  Take over-the-counter and prescription medicines only as told by your doctor.  If you were prescribed an antibiotic medicine, take it as told by your doctor. Do not stop taking the antibiotic even if you start to feel better. General instructions  Rest.  Drink enough fluids to keep your pee (urine) pale yellow.  Avoid smoking and secondhand smoke. If you smoke and you need help quitting, ask your doctor. Quitting will help your lungs heal faster.  Use an inhaler, cool mist vaporizer, or humidifier as told by your doctor.  Keep all follow-up visits as told by your doctor. This is important. How is this prevented? To lower your risk of getting this condition again:  Wash your hands often with soap and water. If you cannot use soap and water, use hand sanitizer.  Avoid contact with people who have cold symptoms.  Try not to touch your hands to your mouth, nose, or eyes.  Make sure to get the flu shot every year. Contact a doctor if:  Your symptoms do not get better in 2 weeks. Get help right away if:  You cough up blood.  You have chest pain.  You have very bad shortness of breath.  You become dehydrated.  You faint (pass out) or keep feeling like you are going to pass out.  You keep throwing up (vomiting).  You have a very bad headache.  Your fever or chills gets worse. This information is not intended to replace advice given to you by your health care provider. Make sure you discuss any questions you have with your health care provider. Document Released: 12/09/2007 Document  Revised: 02/03/2017 Document Reviewed: 12/11/2015 Elsevier Interactive Patient Education  2019 [employer]vier Inc.  

## 2018-09-28 ENCOUNTER — Other Ambulatory Visit: Payer: Self-pay | Admitting: Family Medicine

## 2018-09-28 ENCOUNTER — Telehealth: Payer: Self-pay | Admitting: Family Medicine

## 2018-09-28 DIAGNOSIS — J208 Acute bronchitis due to other specified organisms: Secondary | ICD-10-CM

## 2018-09-28 MED ORDER — DEXTROMETHORPHAN HBR 15 MG/5ML PO SYRP: 10.0000 mL | ORAL_SOLUTION | Freq: Four times a day (QID) | ORAL | 0 refills | Status: DC | PRN

## 2018-09-28 NOTE — Telephone Encounter (Signed)
Patient notified

## 2018-09-28 NOTE — Telephone Encounter (Signed)
PT states that medicaid will not cover (TESSALON PERLES) 100 MG capsule [[id number]]   and the pt can't afford the out of pocket cost for  Medication, pt wants to know if a cough syrup could be sent to pharmacy or something that medicaid will cover be sent for her cough.  Pt was seen yesterday by virtual visit  [place]

## 2018-09-28 NOTE — Telephone Encounter (Signed)
New RX sent to pharmacy.

## 2018-10-03 ENCOUNTER — Telehealth: Payer: Self-pay | Admitting: Family Medicine

## 2018-10-03 NOTE — Telephone Encounter (Signed)
Left detailed message stating will send rx to [facility] and to call back with any further questions or concerns.

## 2018-10-03 NOTE — Telephone Encounter (Signed)
Pharmacy [facility]   Pt states that she would like to have her Tens unit sent to pharmacy, states that it wasn't sent when she was seen for her bronchitis on the 24th.

## 2018-10-03 NOTE — Telephone Encounter (Signed)
Spoke with DME dept at [facility] and gave information. Also faxed requested records.

## 2018-10-03 NOTE — Telephone Encounter (Signed)
I printed it out and faxed to [place].

## 2018-10-04 ENCOUNTER — Other Ambulatory Visit: Payer: Self-pay | Admitting: Cardiology

## 2018-10-04 ENCOUNTER — Other Ambulatory Visit: Payer: Self-pay | Admitting: Family
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
## 2018-10-06 ENCOUNTER — Telehealth: Payer: Self-pay | Admitting: *Deleted

## 2018-10-06 NOTE — Telephone Encounter (Signed)
   Primary Cardiologist:  Carlyle Dolly, MD   Patient contacted.  History reviewed.  No symptoms to suggest any unstable cardiac conditions.  Based on discussion, with current pandemic situation, we will be postponing this appointment for Rachael Watson with a plan for f/u on 11/21/2018 with Daune Perch, NP  If symptoms change, she has been instructed to contact our office.        Marland Kitchen

## 2018-10-07 ENCOUNTER — Ambulatory Visit (INDEPENDENT_AMBULATORY_CARE_PROVIDER_SITE_OTHER): Payer: Medicaid Other | Admitting: Psychiatry

## 2018-10-07 ENCOUNTER — Encounter (HOSPITAL_COMMUNITY): Payer: Self-pay | Admitting: Psychiatry

## 2018-10-07 ENCOUNTER — Other Ambulatory Visit: Payer: Self-pay

## 2018-10-07 DIAGNOSIS — F4310 Post-traumatic stress disorder, unspecified: Secondary | ICD-10-CM

## 2018-10-07 DIAGNOSIS — F0630 Mood disorder due to known physiological condition, unspecified: Secondary | ICD-10-CM | POA: Diagnosis not present

## 2018-10-07 MED ORDER — CITALOPRAM HYDROBROMIDE 40 MG PO TABS: 40.0000 mg | ORAL_TABLET | Freq: Every day | ORAL | 0 refills | Status: DC

## 2018-10-07 MED ORDER — DIAZEPAM 2 MG PO TABS: 2.0000 mg | ORAL_TABLET | Freq: Every day | ORAL | 0 refills | Status: DC | PRN

## 2018-10-07 MED ORDER — LURASIDONE HCL 20 MG PO TABS: 20.0000 mg | ORAL_TABLET | Freq: Every day | ORAL | 0 refills | Status: DC

## 2018-10-07 NOTE — Patient Instructions (Signed)
1. Continue citalopram 40 mg daily  2. Start latuda 20 mg daily  3. Start valium 2 mg daily as needed for anxiety  4. Return to clinic on 4/30 at 2 PM for 30 mins, video visit

## 2018-10-10 ENCOUNTER — Ambulatory Visit: Payer: Medicaid Other | Admitting: Cardiology

## 2018-10-25 ENCOUNTER — Other Ambulatory Visit: Payer: Self-pay

## 2018-10-25 ENCOUNTER — Ambulatory Visit (INDEPENDENT_AMBULATORY_CARE_PROVIDER_SITE_OTHER): Payer: Medicaid Other | Admitting: Family Medicine

## 2018-10-25 ENCOUNTER — Telehealth: Payer: Self-pay | Admitting: Family Medicine

## 2018-10-25 ENCOUNTER — Encounter: Payer: Self-pay | Admitting: Family Medicine

## 2018-10-25 DIAGNOSIS — J069 Acute upper respiratory infection, unspecified: Secondary | ICD-10-CM

## 2018-10-25 MED ORDER — AMOXICILLIN-POT CLAVULANATE 875-125 MG PO TABS: 1.0000 | ORAL_TABLET | Freq: Two times a day (BID) | ORAL | 0 refills | Status: AC

## 2018-10-25 MED ORDER — BENZONATATE 100 MG PO CAPS: 100.0000 mg | ORAL_CAPSULE | Freq: Three times a day (TID) | ORAL | 0 refills | Status: DC | PRN

## 2018-10-25 MED ORDER — PREDNISONE 20 MG PO TABS: ORAL_TABLET | ORAL | 0 refills | Status: DC
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
## 2018-11-03 ENCOUNTER — Ambulatory Visit (HOSPITAL_COMMUNITY): Payer: Medicaid Other | Admitting: Psychiatry

## 2018-11-08 ENCOUNTER — Other Ambulatory Visit: Payer: Self-pay | Admitting: Family
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
## 2018-11-10 ENCOUNTER — Other Ambulatory Visit: Payer: Self-pay | Admitting: Family

## 2018-11-14 ENCOUNTER — Other Ambulatory Visit: Payer: Self-pay

## 2018-11-14 ENCOUNTER — Encounter (HOSPITAL_COMMUNITY): Payer: Medicaid Other | Admitting: Psychiatry

## 2018-11-16 NOTE — Progress Notes (Signed)
This encounter was created in error - please disregard.
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
## 2018-11-21 ENCOUNTER — Ambulatory Visit: Payer: Medicaid Other | Admitting: Cardiology

## 2018-11-22 ENCOUNTER — Encounter (HOSPITAL_COMMUNITY): Payer: Self-pay | Admitting: Psychiatry

## 2018-11-22 ENCOUNTER — Ambulatory Visit (INDEPENDENT_AMBULATORY_CARE_PROVIDER_SITE_OTHER): Payer: Medicaid Other | Admitting: Psychiatry

## 2018-11-22 ENCOUNTER — Other Ambulatory Visit: Payer: Self-pay

## 2018-11-22 DIAGNOSIS — F0630 Mood disorder due to known physiological condition, unspecified: Secondary | ICD-10-CM

## 2018-11-22 DIAGNOSIS — F4310 Post-traumatic stress disorder, unspecified: Secondary | ICD-10-CM | POA: Diagnosis not present

## 2018-11-22 MED ORDER — LURASIDONE HCL 20 MG PO TABS: 20.0000 mg | ORAL_TABLET | Freq: Every day | ORAL | 0 refills | Status: DC

## 2018-11-22 MED ORDER — PRAZOSIN HCL 1 MG PO CAPS: ORAL_CAPSULE | ORAL | 0 refills | Status: DC

## 2018-11-22 MED ORDER — CITALOPRAM HYDROBROMIDE 40 MG PO TABS: 40.0000 mg | ORAL_TABLET | Freq: Every day | ORAL | 0 refills | Status: DC

## 2018-11-22 MED ORDER — DIAZEPAM 2 MG PO TABS: 2.0000 mg | ORAL_TABLET | Freq: Every day | ORAL | 0 refills | Status: DC | PRN

## 2018-11-22 NOTE — Patient Instructions (Signed)
1. Reinitiate citalopram 40 mg daily  2. Continue latuda 20 mg daily   3. Continue valium 2 mg daily as needed for anxiety  4. Start prazosin 1 mg at night for three days, then 2 mg at night  5. Next appointment: 6/18 at 9:30 6. Referral to therapy  7. Obtain blood test at [facility]
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
## 2018-12-22 ENCOUNTER — Telehealth (HOSPITAL_COMMUNITY): Payer: Self-pay | Admitting: Psychiatry

## 2018-12-22 ENCOUNTER — Other Ambulatory Visit: Payer: Self-pay

## 2018-12-22 ENCOUNTER — Ambulatory Visit (HOSPITAL_COMMUNITY): Payer: Medicaid Other | Admitting: Psychiatry

## 2018-12-22 NOTE — Telephone Encounter (Signed)
Called twice for appointment scheduled today. The patient did not answer the phone. Left voice message to contact the office.

## 2018-12-26 ENCOUNTER — Other Ambulatory Visit: Payer: Self-pay

## 2018-12-26 ENCOUNTER — Telehealth (HOSPITAL_COMMUNITY): Payer: Self-pay | Admitting: Psychiatry

## 2018-12-26 ENCOUNTER — Ambulatory Visit (HOSPITAL_COMMUNITY): Payer: Medicaid Other | Admitting: Psychiatry

## 2018-12-26 NOTE — Telephone Encounter (Signed)
Therapist left message for patient indicating attempt to contact for scheduled appointment and requested patient call office

## 2018-12-30 ENCOUNTER — Other Ambulatory Visit: Payer: Self-pay | Admitting: *Deleted
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
## 2019-01-04 ENCOUNTER — Ambulatory Visit (INDEPENDENT_AMBULATORY_CARE_PROVIDER_SITE_OTHER): Payer: Medicaid Other | Admitting: Psychiatry

## 2019-01-04 ENCOUNTER — Other Ambulatory Visit: Payer: Self-pay

## 2019-01-04 ENCOUNTER — Encounter (HOSPITAL_COMMUNITY): Payer: Self-pay | Admitting: Psychiatry

## 2019-01-04 DIAGNOSIS — F0630 Mood disorder due to known physiological condition, unspecified: Secondary | ICD-10-CM | POA: Diagnosis not present

## 2019-01-04 DIAGNOSIS — F4310 Post-traumatic stress disorder, unspecified: Secondary | ICD-10-CM | POA: Diagnosis not present

## 2019-01-04 MED ORDER — PRAZOSIN HCL 2 MG PO CAPS: 2.0000 mg | ORAL_CAPSULE | Freq: Every day | ORAL | 0 refills | Status: DC

## 2019-01-04 MED ORDER — LURASIDONE HCL 40 MG PO TABS: 40.0000 mg | ORAL_TABLET | Freq: Every day | ORAL | 1 refills | Status: DC

## 2019-01-04 MED ORDER — CITALOPRAM HYDROBROMIDE 40 MG PO TABS: 40.0000 mg | ORAL_TABLET | Freq: Every day | ORAL | 0 refills | Status: DC

## 2019-01-04 MED ORDER — DIAZEPAM 2 MG PO TABS: 2.0000 mg | ORAL_TABLET | Freq: Every day | ORAL | 1 refills | Status: DC | PRN

## 2019-01-04 NOTE — Patient Instructions (Addendum)
1. Continue citalopram 40 mg daily  2. Increase latuda 40 mg daily 3. Continue valium 2 mg daily as needed for anxiety  4. Continue prazosin 2 mg at night  5. Next appointment: 8/27 9:20 6. Obtain blood test at [facility] (CBC, TSH)

## 2019-01-12 ENCOUNTER — Telehealth: Payer: Self-pay | Admitting: Family

## 2019-01-12 NOTE — Telephone Encounter (Signed)
OV note and diagnosis code sent faxed

## 2019-01-19 ENCOUNTER — Telehealth: Payer: Self-pay | Admitting: Cardiology

## 2019-01-19 NOTE — Telephone Encounter (Signed)
Patient received a bill for six dollars. She called stating that billing told her that we charged her six dollars for the no show on Nov 21, 2018 and that our office would have to correct?

## 2019-01-20 NOTE — Telephone Encounter (Signed)
Returned patient's call.  See account notes for details.

## 2019-01-24 ENCOUNTER — Other Ambulatory Visit: Payer: Self-pay | Admitting: Family Medicine

## 2019-01-26 ENCOUNTER — Other Ambulatory Visit: Payer: Self-pay | Admitting: Family Medicine

## 2019-01-30 ENCOUNTER — Other Ambulatory Visit (INDEPENDENT_AMBULATORY_CARE_PROVIDER_SITE_OTHER): Payer: Self-pay | Admitting: *Deleted

## 2019-01-30 DIAGNOSIS — R1011 Right upper quadrant pain: Secondary | ICD-10-CM

## 2019-01-31 ENCOUNTER — Telehealth: Payer: Self-pay | Admitting: Family

## 2019-01-31 MED ORDER — ATORVASTATIN CALCIUM 40 MG PO TABS: 40.0000 mg | ORAL_TABLET | Freq: Every day | ORAL | 0 refills | Status: DC

## 2019-01-31 MED ORDER — OMEPRAZOLE 20 MG PO CPDR: 20.0000 mg | DELAYED_RELEASE_CAPSULE | Freq: Every day | ORAL | 0 refills | Status: DC

## 2019-01-31 NOTE — Telephone Encounter (Signed)
Prescription sent to pharmacy.

## 2019-01-31 NOTE — Telephone Encounter (Signed)
Please advise 

## 2019-01-31 NOTE — Telephone Encounter (Signed)
Had an acute visit on 4-20 but please review encounter history on appointments.  Last labs done here were 2-19 also.
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
## 2019-03-02 ENCOUNTER — Telehealth (HOSPITAL_COMMUNITY): Payer: Self-pay | Admitting: Psychiatry

## 2019-03-02 ENCOUNTER — Other Ambulatory Visit: Payer: Self-pay

## 2019-03-02 ENCOUNTER — Ambulatory Visit (HOSPITAL_COMMUNITY): Payer: Medicaid Other | Admitting: Psychiatry

## 2019-03-02 ENCOUNTER — Other Ambulatory Visit (HOSPITAL_COMMUNITY): Payer: Self-pay | Admitting: Psychiatry

## 2019-03-02 MED ORDER — DIAZEPAM 2 MG PO TABS: 2.0000 mg | ORAL_TABLET | Freq: Every day | ORAL | 0 refills | Status: DC | PRN

## 2019-03-02 MED ORDER — LURASIDONE HCL 40 MG PO TABS: 40.0000 mg | ORAL_TABLET | Freq: Every day | ORAL | 0 refills | Status: DC

## 2019-03-02 NOTE — Telephone Encounter (Signed)
Called twice on her phone. She did not answer. No option to leave voice message.

## 2019-04-07 ENCOUNTER — Other Ambulatory Visit: Payer: Self-pay

## 2019-04-07 ENCOUNTER — Ambulatory Visit: Payer: Medicaid Other | Admitting: Family

## 2019-04-26 ENCOUNTER — Encounter: Payer: Self-pay | Admitting: Family

## 2019-04-26 ENCOUNTER — Ambulatory Visit (INDEPENDENT_AMBULATORY_CARE_PROVIDER_SITE_OTHER): Payer: Medicaid Other | Admitting: Family

## 2019-04-26 NOTE — Progress Notes (Signed)
   Virtual Visit via telephone Note Due to COVID-19 pandemic this visit was conducted virtually. This visit type was conducted due to national recommendations for restrictions regarding the COVID-19 Pandemic (e.g. social distancing, sheltering in place) in an effort to limit this patient's exposure and mitigate transmission in our community. All issues noted in this document were discussed and addressed.  A physical exam was not performed with this format.  Attempted to call patient at 12:06 pm, no answer. Unable to leave a VM.   Attempted to call patient at 1:03 pm , no answer. Unable to leave a VM.   Will close chart  Evelina Dun, FNP

## 2019-05-01 ENCOUNTER — Ambulatory Visit (INDEPENDENT_AMBULATORY_CARE_PROVIDER_SITE_OTHER): Payer: Medicaid Other | Admitting: Family

## 2019-05-01 ENCOUNTER — Encounter: Payer: Self-pay | Admitting: Family

## 2019-05-01 DIAGNOSIS — K5900 Constipation, unspecified: Secondary | ICD-10-CM | POA: Insufficient documentation

## 2019-05-01 DIAGNOSIS — F321 Major depressive disorder, single episode, moderate: Secondary | ICD-10-CM

## 2019-05-01 DIAGNOSIS — F411 Generalized anxiety disorder: Secondary | ICD-10-CM | POA: Diagnosis not present

## 2019-05-01 DIAGNOSIS — F17200 Nicotine dependence, unspecified, uncomplicated: Secondary | ICD-10-CM

## 2019-05-01 DIAGNOSIS — K219 Gastro-esophageal reflux disease without esophagitis: Secondary | ICD-10-CM | POA: Diagnosis not present

## 2019-05-01 DIAGNOSIS — E039 Hypothyroidism, unspecified: Secondary | ICD-10-CM | POA: Insufficient documentation

## 2019-05-01 DIAGNOSIS — K5903 Drug induced constipation: Secondary | ICD-10-CM

## 2019-05-01 DIAGNOSIS — M159 Polyosteoarthritis, unspecified: Secondary | ICD-10-CM | POA: Diagnosis not present

## 2019-05-01 DIAGNOSIS — E669 Obesity, unspecified: Secondary | ICD-10-CM

## 2019-05-01 DIAGNOSIS — E785 Hyperlipidemia, unspecified: Secondary | ICD-10-CM

## 2019-05-01 MED ORDER — CITALOPRAM HYDROBROMIDE 40 MG PO TABS: 40.0000 mg | ORAL_TABLET | Freq: Every day | ORAL | 0 refills | Status: DC

## 2019-05-01 MED ORDER — LEVOTHYROXINE SODIUM 88 MCG PO TABS: 88.0000 ug | ORAL_TABLET | Freq: Every day | ORAL | 3 refills | Status: DC

## 2019-05-01 MED ORDER — ATORVASTATIN CALCIUM 40 MG PO TABS: 40.0000 mg | ORAL_TABLET | Freq: Every day | ORAL | 0 refills | Status: DC

## 2019-05-01 MED ORDER — ALBUTEROL SULFATE HFA 108 (90 BASE) MCG/ACT IN AERS: 2.0000 | INHALATION_SPRAY | Freq: Four times a day (QID) | RESPIRATORY_TRACT | 2 refills | Status: DC | PRN

## 2019-05-01 MED ORDER — CYCLOBENZAPRINE HCL 10 MG PO TABS: 10.0000 mg | ORAL_TABLET | Freq: Three times a day (TID) | ORAL | 3 refills | Status: DC

## 2019-05-01 MED ORDER — OMEPRAZOLE 40 MG PO CPDR: 40.0000 mg | DELAYED_RELEASE_CAPSULE | Freq: Every day | ORAL | 3 refills | Status: DC

## 2019-05-01 NOTE — Progress Notes (Signed)
Virtual Visit via telephone Note Due to COVID-19 pandemic this visit was conducted virtually. This visit type was conducted due to national recommendations for restrictions regarding the COVID-19 Pandemic (e.g. social distancing, sheltering in place) in an effort to limit this patient's exposure and mitigate transmission in our community. All issues noted in this document were discussed and addressed.  A physical exam was not performed with this format.  I connected with Rachael Watson on 05/01/19 at 2:10 pm  by telephone and verified that I am speaking with the correct person using two identifiers. Rachael Watson is currently located at home and no one is currently with her during visit. The provider, Evelina Dun, FNP is located in their office at time of visit.  I discussed the limitations, risks, security and privacy concerns of performing an evaluation and management service by telephone and the availability of in person appointments. I also discussed with the patient that there may be a patient responsible charge related to this service. The patient expressed understanding and agreed to proceed.   History and Present Illness:  Pt calls the office today for chronic follow up. She is followed by Ortho for a broken heel. She is followed by [facility] every month, but states she is in the process of finding a new one. She is followed by Pain Management every month. She is followed by Cardiologists every 6 months. Depression        This is a chronic problem.  The current episode started more than 1 year ago.   The onset quality is gradual.   The problem occurs intermittently.  The problem has been waxing and waning since onset.  Associated symptoms include fatigue, insomnia, irritable, restlessness, decreased interest and sad.  Associated symptoms include no helplessness and no hopelessness.  Past treatments include SSRIs - Selective serotonin reuptake inhibitors.  Past medical history  includes thyroid problem and anxiety.   Anxiety Presents for follow-up visit. Symptoms include chest pain, depressed mood, excessive worry, insomnia, irritability, nausea, nervous/anxious behavior, obsessions and restlessness. Symptoms occur most days. The severity of symptoms is moderate.    Gastroesophageal Reflux She complains of belching, chest pain, heartburn and nausea. This is a chronic problem. The symptoms are aggravated by certain foods and smoking. Associated symptoms include fatigue. She has tried a PPI for the symptoms. The treatment provided mild relief.  Arthritis Presents for follow-up visit. She complains of pain. Affected locations include the right MCP, left MCP, right ankle and left ankle. Her pain is at a severity of 6/10. Associated symptoms include fatigue. Pertinent negatives include no diarrhea.  Thyroid Problem Presents for follow-up visit. Symptoms include anxiety, constipation, depressed mood and fatigue. Patient reports no diarrhea or dry skin. The symptoms have been stable. Her past medical history is significant for hyperlipidemia.  Nicotine Dependence Presents for follow-up visit. Symptoms include fatigue, insomnia and irritability. Her urge triggers include company of smokers. The symptoms have been worsening. She smokes < 1/2 a pack of cigarettes per day.  Hyperlipidemia This is a chronic problem. The current episode started more than 1 year ago. The problem is controlled. Recent lipid tests were reviewed and are normal. Exacerbating diseases include obesity. Associated symptoms include chest pain. Current antihyperlipidemic treatment includes statins. The current treatment provides mild improvement of lipids. Risk factors for coronary artery disease include post-menopausal.        Review of Systems  Constitutional: Positive for fatigue and irritability.  Cardiovascular: Positive for chest pain.  Gastrointestinal: Positive for  constipation, heartburn and  nausea. Negative for diarrhea.  Musculoskeletal: Positive for arthritis.  Psychiatric/Behavioral: Positive for depression. The patient is nervous/anxious and has insomnia.   All other systems reviewed and are negative.    Observations/Objective: No SOB or distress noted   Assessment and Plan: Rachael Watson comes in today with chief complaint of No chief complaint on file.   Diagnosis and orders addressed:  1. Osteoarthritis of multiple joints, unspecified osteoarthritis type - CMP14+EGFR - CBC with Differential/Platelet  2. Gastroesophageal reflux disease, unspecified whether esophagitis present - CMP14+EGFR - CBC with Differential/Platelet - omeprazole (PRILOSEC) 40 MG capsule; Take 1 capsule (40 mg total) by mouth daily.  Dispense: 90 capsule; Refill: 3  3. Obesity (BMI 30-39.9) - CMP14+EGFR - CBC with Differential/Platelet  4. GAD (generalized anxiety disorder) - CMP14+EGFR - CBC with Differential/Platelet - citalopram (CELEXA) 40 MG tablet; Take 1 tablet (40 mg total) by mouth daily.  Dispense: 90 tablet; Refill: 0  5. Depression, major, single episode, moderate (HCC) - CMP14+EGFR - CBC with Differential/Platelet - citalopram (CELEXA) 40 MG tablet; Take 1 tablet (40 mg total) by mouth daily.  Dispense: 90 tablet; Refill: 0  6. Current smoker - CMP14+EGFR - CBC with Differential/Platelet  7. Drug-induced constipation - CMP14+EGFR - CBC with Differential/Platelet  8. Hypothyroidism, unspecified type - CMP14+EGFR - CBC with Differential/Platelet - TSH - levothyroxine (SYNTHROID) 88 MCG tablet; Take 1 tablet (88 mcg total) by mouth daily.  Dispense: 90 tablet; Refill: 3   10. Hyperlipidemia, unspecified hyperlipidemia type - CMP14+EGFR - CBC with Differential/Platelet - Lipid panel - atorvastatin (LIPITOR) 40 MG tablet; Take 1 tablet (40 mg total) by mouth daily.  Dispense: 30 tablet; Refill: 0   Labs pending Health Maintenance reviewed Diet and  exercise encouraged  Follow up plan: 6 months and keep specialists follow up      I discussed the assessment and treatment plan with the patient. The patient was provided an opportunity to ask questions and all were answered. The patient agreed with the plan and demonstrated an understanding of the instructions.   The patient was advised to call back or seek an in-person evaluation if the symptoms worsen or if the condition fails to improve as anticipated.  The above assessment and management plan was discussed with the patient. The patient verbalized understanding of and has agreed to the management plan. Patient is aware to call the clinic if symptoms persist or worsen. Patient is aware when to return to the clinic for a follow-up visit. Patient educated on when it is appropriate to go to the emergency department.   Time call ended:  2:32 pm  I provided 22 minutes of non-face-to-face time during this encounter.    Evelina Dun, FNP '

## 2019-05-03 ENCOUNTER — Other Ambulatory Visit: Payer: Self-pay | Admitting: Cardiology

## 2019-05-04 ENCOUNTER — Other Ambulatory Visit: Payer: Self-pay | Admitting: Family

## 2019-05-04 ENCOUNTER — Other Ambulatory Visit: Payer: Self-pay | Admitting: Cardiology

## 2019-05-05 ENCOUNTER — Other Ambulatory Visit: Payer: Self-pay | Admitting: *Deleted

## 2019-05-05 MED ORDER — ISOSORBIDE MONONITRATE ER 30 MG PO TB24: 15.0000 mg | ORAL_TABLET | Freq: Every day | ORAL | 0 refills | Status: DC

## 2019-05-08 ENCOUNTER — Telehealth: Payer: Self-pay | Admitting: Family

## 2019-05-08 DIAGNOSIS — Z8781 Personal history of (healed) traumatic fracture: Secondary | ICD-10-CM

## 2019-05-08 DIAGNOSIS — Z780 Asymptomatic menopausal state: Secondary | ICD-10-CM

## 2019-05-10 ENCOUNTER — Other Ambulatory Visit: Payer: Self-pay | Admitting: Family Medicine

## 2019-05-10 ENCOUNTER — Other Ambulatory Visit: Payer: Self-pay | Admitting: Physician Assistant

## 2019-05-11 ENCOUNTER — Other Ambulatory Visit: Payer: Self-pay

## 2019-05-11 DIAGNOSIS — Z780 Asymptomatic menopausal state: Secondary | ICD-10-CM

## 2019-05-11 DIAGNOSIS — Z8781 Personal history of (healed) traumatic fracture: Secondary | ICD-10-CM

## 2019-05-11 DIAGNOSIS — E2839 Other primary ovarian failure: Secondary | ICD-10-CM

## 2019-05-11 NOTE — Telephone Encounter (Signed)
Left message to call back  

## 2019-05-11 NOTE — Telephone Encounter (Signed)
Spoke to patient she is wanting to have a bone density scan done.  Patient states that she has had a recent (04/03/19) multiple fractures in her ankle and heel from simply missing a step. Patient is concerned.  Patient is post menopausal.  Please advise if bone density exam approved to order.

## 2019-05-11 NOTE — Telephone Encounter (Signed)
Dexa scan ordered. 

## 2019-05-11 NOTE — Telephone Encounter (Signed)
Aware, dexa was ordered.

## 2019-05-12 ENCOUNTER — Other Ambulatory Visit: Payer: Self-pay

## 2019-05-12 ENCOUNTER — Ambulatory Visit (INDEPENDENT_AMBULATORY_CARE_PROVIDER_SITE_OTHER): Payer: Medicaid Other | Admitting: Cardiology

## 2019-05-12 ENCOUNTER — Encounter: Payer: Self-pay | Admitting: Cardiology

## 2019-05-12 VITALS — BP 116/78 | HR 83 | Ht 66.0 in

## 2019-05-12 DIAGNOSIS — R079 Chest pain, unspecified: Secondary | ICD-10-CM

## 2019-05-12 DIAGNOSIS — Z8679 Personal history of other diseases of the circulatory system: Secondary | ICD-10-CM

## 2019-05-12 DIAGNOSIS — E785 Hyperlipidemia, unspecified: Secondary | ICD-10-CM

## 2019-05-12 MED ORDER — ATORVASTATIN CALCIUM 40 MG PO TABS: 40.0000 mg | ORAL_TABLET | Freq: Every day | ORAL | 3 refills | Status: DC

## 2019-05-12 MED ORDER — FUROSEMIDE 40 MG PO TABS: ORAL_TABLET | ORAL | 1 refills | Status: DC

## 2019-05-12 MED ORDER — ISOSORBIDE MONONITRATE ER 30 MG PO TB24: 15.0000 mg | ORAL_TABLET | Freq: Every day | ORAL | 3 refills | Status: DC
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
## 2019-05-12 NOTE — Patient Instructions (Signed)

## 2019-05-29 ENCOUNTER — Ambulatory Visit (INDEPENDENT_AMBULATORY_CARE_PROVIDER_SITE_OTHER): Payer: Medicaid Other | Admitting: Family Medicine

## 2019-05-29 ENCOUNTER — Other Ambulatory Visit: Payer: Self-pay

## 2019-05-29 DIAGNOSIS — L304 Erythema intertrigo: Secondary | ICD-10-CM | POA: Diagnosis not present

## 2019-05-29 MED ORDER — KETOCONAZOLE 2 % EX CREA: 1.0000 "application " | TOPICAL_CREAM | Freq: Every day | CUTANEOUS | 0 refills | Status: DC

## 2019-05-29 NOTE — Progress Notes (Signed)
Telephone visit  Subjective: CC: rash PCP: Sharion Balloon, FNP Rachael Watson is a 53 y.o. female calls for telephone consult today. Patient provides verbal consent for consult held via phone.  Location of patient: home Location of provider: [facility] Others present for call: none  1.  Rash Patient reports onset of rash under her breasts ~2 weeks.  She reports redness, raw and painful rash.  She has been using old nystatin cream with no improvement.  Does not report any fevers, purulent discharge.  ROS: Per HPI  Allergies  Allergen Reactions  . Abilify [Aripiprazole] Other (See Comments)    Violent behavior  . Quetiapine Fumarate Other (See Comments)  . Risperidone Other (See Comments)    Unknown  . Seroquel [Quetiapine] Other (See Comments)    Tremors and unable to swallow   Past Medical History:  Diagnosis Date  . Anxiety   . Chest pain, precordial 12/17/2017  . CHF (congestive heart failure) ([facility])    2009  . Diabetes mellitus without complication ([facility])     diet control 1 year ago  . GERD (gastroesophageal reflux disease)   . Thyroid disease     Current Outpatient Medications:  .  albuterol (VENTOLIN HFA) 108 (90 Base) MCG/ACT inhaler, Inhale 2 puffs into the lungs every 6 (six) hours as needed for wheezing or shortness of breath., Disp: 8 g, Rfl: 2 .  aspirin 325 MG tablet, Take 162 mg by mouth daily., Disp: , Rfl:  .  atorvastatin (LIPITOR) 40 MG tablet, Take 1 tablet (40 mg total) by mouth daily., Disp: 90 tablet, Rfl: 3 .  carboxymethylcellulose (REFRESH PLUS) 0.5 % SOLN, 1 drop as needed., Disp: , Rfl:  .  citalopram (CELEXA) 40 MG tablet, Take 1 tablet (40 mg total) by mouth daily., Disp: 90 tablet, Rfl: 0 .  diazepam (VALIUM) 2 MG tablet, Take 1 tablet (2 mg total) by mouth daily as needed for anxiety., Disp: 30 tablet, Rfl: 0 .  furosemide (LASIX) 40 MG tablet, TAKE 1 TABLET BY MOUTH ONCE DAILY. MAY TAKE AN ADDITIONAL 1/2-1 TABLET AS NEEDED FOR SWELLING., Disp:  135 tablet, Rfl: 1 .  gabapentin (NEURONTIN) 300 MG capsule, Take 300 mg by mouth 3 (three) times daily. , Disp: , Rfl:  .  ibuprofen (ADVIL) 600 MG tablet, Take 600 mg by mouth every 6 (six) hours as needed., Disp: , Rfl:  .  isosorbide mononitrate (IMDUR) 30 MG 24 hr tablet, Take 0.5 tablets (15 mg total) by mouth daily., Disp: 45 tablet, Rfl: 3 .  levothyroxine (SYNTHROID) 88 MCG tablet, Take 1 tablet (88 mcg total) by mouth daily., Disp: 90 tablet, Rfl: 3 .  lurasidone (LATUDA) 40 MG TABS tablet, Take 1 tablet (40 mg total) by mouth daily with breakfast., Disp: 30 tablet, Rfl: 0 .  meclizine (ANTIVERT) 25 MG tablet, TAKE 1 TABLET BY MOUTH 3 TIMES DAILY., Disp: 60 tablet, Rfl: 0 .  Multiple Vitamins-Minerals (MULTIVITAMIN WITH MINERALS) tablet, Take 1 tablet by mouth daily., Disp: , Rfl:  .  omeprazole (PRILOSEC) 40 MG capsule, Take 1 capsule (40 mg total) by mouth daily., Disp: 90 capsule, Rfl: 3 .  oxyCODONE-acetaminophen (PERCOCET) 10-325 MG tablet, Take 1 tablet by mouth 4 (four) times daily as needed for pain., Disp: , Rfl:  .  prazosin (MINIPRESS) 2 MG capsule, Take 1 capsule (2 mg total) by mouth at bedtime., Disp: 90 capsule, Rfl: 0 .  RESTASIS 0.05 % ophthalmic emulsion, INSTILL 1 DROP INTO BOTH EYES TWICE DAILY., Disp: 60  each, Rfl: 0  Assessment/ Plan: 53 y.o. female   1. Intertrigo It sounds like she has a moderate to severe case of candidal intertrigo.  I am going to place her on ketoconazole cream apply to the affected areas daily for up to 4 weeks.  We discussed keeping the area dry.  Discussed signs and symptoms of secondary bacterial infection.  If no significant improvement within the next 2 weeks I would like her to be seen in the office for recheck.  She voiced good understanding of the plan will follow-up as directed - ketoconazole (NIZORAL) 2 % cream; Apply 1 application topically daily. For up to 4 weeks  Dispense: 60 g; Refill: 0   Start time: 4:57pm End time:  5:02pm  Total time spent on patient care (including telephone call/ virtual visit): 12 minutes  [place], [place] [phone]

## 2019-05-29 NOTE — Patient Instructions (Signed)
Intertrigo Intertrigo is skin irritation (inflammation) that happens in warm, moist areas of the body. The irritation can cause a rash and make skin raw and itchy. The rash is usually pink or red. It happens mostly between folds of skin or where skin rubs together, such as:  Between the toes.  In the armpits.  In the groin area.  Under the belly.  Under the breasts.  Around the butt area. This condition is not passed from person to person (is not contagious). What are the causes?  Heat, moisture, rubbing, and not enough air movement.  The condition can be made worse by: ? Sweat. ? Bacteria. ? A fungus, such as yeast. What increases the risk?  Moisture in your skin folds.  You are more likely to develop this condition if you: ? Have diabetes. ? Are overweight. ? Are not able to move around. ? Live in a warm and moist climate. ? Wear splints, braces, or other medical devices. ? Are not able to control your pee (urine) or poop (stool). What are the signs or symptoms?  A pink or red skin rash in the skin fold or near the skin fold.  Raw or scaly skin.  Itching.  A burning feeling.  Bleeding.  Leaking fluid.  A bad smell. How is this treated?  Cleaning and drying your skin.  Taking an antibiotic medicine or using an antibiotic skin cream for a bacterial infection.  Using an antifungal cream on your skin or taking pills for an infection that was caused by a fungus, such as yeast.  Using a steroid ointment to stop the itching and irritation.  Separating the skin fold with a clean cotton cloth to absorb moisture and allow air to flow into the area. Follow these instructions at home:  Keep the affected area clean and dry.  Do not scratch your skin.  Stay cool as much as you can. Use an air conditioner or a fan, if you have one.  Apply over-the-counter and prescription medicines only as told by your doctor.  If you were prescribed an antibiotic medicine,  use it as told by your doctor. Do not stop using the antibiotic even if your condition starts to get better.  Keep all follow-up visits as told by your doctor. This is important. How is this prevented?   Stay at a healthy weight.  Take care of your feet. This is very important if you have diabetes. You should: ? Wear shoes that fit well. ? Keep your feet dry. ? Wear clean cotton or wool socks.  Protect the skin in your groin and butt area as told by your doctor. To do this: ? Follow a regular cleaning routine. ? Use creams, powders, or ointments that protect your skin. ? Change protection pads often.  Do not wear tight clothes. Wear clothes that: ? Are loose. ? Take moisture away from your body. ? Are made of cotton.  Wear a bra that gives good support, if needed.  Shower and dry yourself well after being active. Use a hair dryer on a cool setting to dry between skin folds.  Keep your blood sugar under control if you have diabetes. Contact a doctor if:  Your symptoms do not get better with treatment.  Your symptoms get worse or they spread.  You notice more redness and warmth.  You have a fever. Summary  Intertrigo is skin irritation that occurs when folds of skin rub together.  This condition is caused by heat, moisture,   and rubbing.  This condition may be treated by cleaning and drying your skin and with medicines.  Apply over-the-counter and prescription medicines only as told by your doctor.  Keep all follow-up visits as told by your doctor. This is important. This information is not intended to replace advice given to you by your health care provider. Make sure you discuss any questions you have with your health care provider. Document Released: 07/25/2010 Document Revised: 03/31/2018 Document Reviewed: 03/31/2018 Elsevier Patient Education  2020 [employer]  

## 2019-06-07 ENCOUNTER — Other Ambulatory Visit (HOSPITAL_COMMUNITY): Payer: Medicaid Other

## 2019-06-12 ENCOUNTER — Other Ambulatory Visit: Payer: Self-pay | Admitting: Family

## 2019-06-12 DIAGNOSIS — E785 Hyperlipidemia, unspecified: Secondary | ICD-10-CM

## 2019-06-13 ENCOUNTER — Other Ambulatory Visit (HOSPITAL_COMMUNITY): Payer: Medicaid Other

## 2019-06-19 ENCOUNTER — Other Ambulatory Visit (HOSPITAL_COMMUNITY): Payer: Medicaid Other

## 2019-06-21 ENCOUNTER — Other Ambulatory Visit: Payer: Self-pay | Admitting: *Deleted

## 2019-06-21 MED ORDER — GABAPENTIN 300 MG PO CAPS: 300.0000 mg | ORAL_CAPSULE | Freq: Three times a day (TID) | ORAL | 1 refills | Status: DC

## 2019-07-05 ENCOUNTER — Other Ambulatory Visit (HOSPITAL_COMMUNITY): Payer: Medicaid Other

## 2019-08-09 ENCOUNTER — Other Ambulatory Visit: Payer: Medicaid Other

## 2019-08-21 ENCOUNTER — Telehealth (HOSPITAL_COMMUNITY): Payer: Self-pay | Admitting: Psychiatry

## 2019-08-21 NOTE — Telephone Encounter (Signed)
Received a letter from [facility] that the patient is under their care and is being prescribed opioids. The letter is to be scanned.

## 2019-09-04 ENCOUNTER — Other Ambulatory Visit: Payer: Self-pay | Admitting: Family

## 2019-09-04 MED ORDER — LURASIDONE HCL 40 MG PO TABS: 40.0000 mg | ORAL_TABLET | Freq: Every day | ORAL | 0 refills | Status: DC

## 2019-09-04 MED ORDER — PRAZOSIN HCL 2 MG PO CAPS: 2.0000 mg | ORAL_CAPSULE | Freq: Every day | ORAL | 0 refills | Status: DC

## 2019-09-04 NOTE — Telephone Encounter (Signed)
Last office visit 05/29/2019

## 2019-09-06 IMAGING — NM NM MYOCAR MULTI W/SPECT W/WALL MOTION & EF
2 series · 12 of 12 positions shown · non-contrast
Comparison: none

[Series 1: rest · 6.51mm/px · 6 of 64 frames shown]
[frame 6/64]
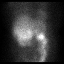
[frame 16/64]
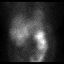
[frame 27/64]
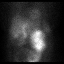
[frame 38/64]
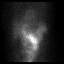
[frame 48/64]
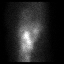
[frame 59/64]
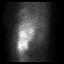

[Series 4: stress gated - perfusion · 6.51mm/px · 6 of 64 frames shown]
[frame 6/64]
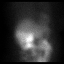
[frame 16/64]
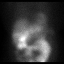
[frame 27/64]
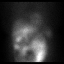
[frame 38/64]
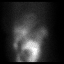
[frame 48/64]
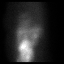
[frame 59/64]
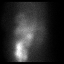

[12 of 12 positions shown; findings below may reference images not displayed]

Canned report from images found in remote index.

Refer to host system for actual result text.

## 2019-10-05 ENCOUNTER — Other Ambulatory Visit: Payer: Self-pay | Admitting: Cardiology

## 2019-10-05 ENCOUNTER — Other Ambulatory Visit: Payer: Self-pay | Admitting: Family

## 2019-10-05 DIAGNOSIS — F321 Major depressive disorder, single episode, moderate: Secondary | ICD-10-CM

## 2019-10-05 DIAGNOSIS — E785 Hyperlipidemia, unspecified: Secondary | ICD-10-CM

## 2019-10-05 DIAGNOSIS — F411 Generalized anxiety disorder: Secondary | ICD-10-CM

## 2019-10-09 ENCOUNTER — Telehealth: Payer: Self-pay | Admitting: Family

## 2019-10-09 NOTE — Telephone Encounter (Signed)
Please call patient

## 2019-10-09 NOTE — Telephone Encounter (Signed)
Appt for DXA made 10/25/19

## 2019-10-10 ENCOUNTER — Telehealth (INDEPENDENT_AMBULATORY_CARE_PROVIDER_SITE_OTHER): Payer: Medicaid Other | Admitting: Family

## 2019-10-10 ENCOUNTER — Encounter: Payer: Self-pay | Admitting: Family

## 2019-10-10 DIAGNOSIS — K219 Gastro-esophageal reflux disease without esophagitis: Secondary | ICD-10-CM

## 2019-10-10 DIAGNOSIS — K5903 Drug induced constipation: Secondary | ICD-10-CM | POA: Diagnosis not present

## 2019-10-10 DIAGNOSIS — Z800 Family history of malignant neoplasm of digestive organs: Secondary | ICD-10-CM

## 2019-10-10 DIAGNOSIS — M159 Polyosteoarthritis, unspecified: Secondary | ICD-10-CM | POA: Diagnosis not present

## 2019-10-10 DIAGNOSIS — F411 Generalized anxiety disorder: Secondary | ICD-10-CM

## 2019-10-10 DIAGNOSIS — E039 Hypothyroidism, unspecified: Secondary | ICD-10-CM

## 2019-10-10 DIAGNOSIS — E669 Obesity, unspecified: Secondary | ICD-10-CM

## 2019-10-10 DIAGNOSIS — M792 Neuralgia and neuritis, unspecified: Secondary | ICD-10-CM

## 2019-10-10 DIAGNOSIS — F17200 Nicotine dependence, unspecified, uncomplicated: Secondary | ICD-10-CM

## 2019-10-10 MED ORDER — HYDROXYZINE PAMOATE 25 MG PO CAPS: 25.0000 mg | ORAL_CAPSULE | Freq: Three times a day (TID) | ORAL | 2 refills | Status: DC | PRN

## 2019-10-10 NOTE — Progress Notes (Signed)
Virtual Visit via telephone Note Due to COVID-19 pandemic this visit was conducted virtually. This visit type was conducted due to national recommendations for restrictions regarding the COVID-19 Pandemic (e.g. social distancing, sheltering in place) in an effort to limit this patient's exposure and mitigate transmission in our community. All issues noted in this document were discussed and addressed.  A physical exam was not performed with this format.  I connected with Rachael Watson on 10/10/19 at 1:03 pm by telephone and verified that I am speaking with the correct person using two identifiers. Rachael Watson is currently located at [facility] and no one  is currently with her during visit. The provider, Evelina Dun, FNP is located in their office at time of visit.  I discussed the limitations, risks, security and privacy concerns of performing an evaluation and management service by telephone and the availability of in person appointments. I also discussed with the patient that there may be a patient responsible charge related to this service. The patient expressed understanding and agreed to proceed.   History and Present Illness:  Pt calls the office today for chronic follow up. She is followed by Ortho for a broken heel. She is followed by [facility] every month, but states she is in the process of finding a new one. She is followed by Pain Management every month. She is followed by Cardiologists every 6 months. Depression        This is a chronic problem.  The current episode started more than 1 year ago.   The onset quality is gradual.   The problem occurs intermittently.  The problem has been waxing and waning since onset.  Associated symptoms include decreased concentration, insomnia, irritable, restlessness, decreased interest and sad.  Associated symptoms include no fatigue, no helplessness and no hopelessness.  Past treatments include SSRIs - Selective serotonin reuptake  inhibitors.  Past medical history includes thyroid problem and anxiety.   Gastroesophageal Reflux She complains of belching, heartburn and a hoarse voice. This is a chronic problem. The problem occurs occasionally. The problem has been waxing and waning. The symptoms are aggravated by certain foods. Pertinent negatives include no fatigue. Risk factors include smoking/tobacco exposure and obesity. She has tried a PPI for the symptoms. The treatment provided moderate relief.  Thyroid Problem Presents for follow-up visit. Symptoms include anxiety, constipation, depressed mood and hoarse voice. Patient reports no diarrhea, dry skin or fatigue. The symptoms have been stable.  Arthritis Presents for follow-up visit. She complains of pain and stiffness. The symptoms have been stable. Affected locations include the right knee, left knee, neck, left shoulder, right shoulder, right MCP and left MCP. Her pain is at a severity of 8/10. Pertinent negatives include no diarrhea or fatigue.  Anxiety Presents for follow-up visit. Symptoms include decreased concentration, depressed mood, excessive worry, insomnia, irritability, nervous/anxious behavior and restlessness. The quality of sleep is good.    Nicotine Dependence Presents for follow-up visit. Symptoms include decreased concentration, insomnia and irritability. Symptoms are negative for fatigue. Her urge triggers include company of smokers. The symptoms have been stable. She smokes 1 pack of cigarettes per day.  Constipation This is a chronic problem. The current episode started more than 1 year ago. The problem has been resolved since onset. Her stool frequency is 1 time per day. Pertinent negatives include no diarrhea. She has tried laxatives for the symptoms. The treatment provided moderate relief.      Review of Systems  Constitutional: Positive for irritability. Negative for  fatigue.  HENT: Positive for hoarse voice.   Gastrointestinal: Positive for  constipation and heartburn. Negative for diarrhea.  Musculoskeletal: Positive for arthritis and stiffness.  Psychiatric/Behavioral: Positive for decreased concentration and depression. The patient is nervous/anxious and has insomnia.   All other systems reviewed and are negative.    Observations/Objective: No SOB or distress noted   Assessment and Plan: Rachael Watson comes in today with chief complaint of No chief complaint on file.   Diagnosis and orders addressed:  1. Gastroesophageal reflux disease, unspecified whether esophagitis present  2. Hypothyroidism, unspecified type  3. Osteoarthritis of multiple joints, unspecified osteoarthritis type  4. Drug-induced constipation  5. Current smoker  6. Family hx of colon cancer  7. Obesity (BMI 30-39.9)  8. Neuropathic pain  9. GAD (generalized anxiety disorder) - hydrOXYzine (VISTARIL) 25 MG capsule; Take 1 capsule (25 mg total) by mouth 3 (three) times daily as needed.  Dispense: 90 capsule; Refill: 2   Labs pending, pt will come in next week to get lab work drawn  Health Maintenance reviewed Diet and exercise encouraged  Follow up plan: 6 months      I discussed the assessment and treatment plan with the patient. The patient was provided an opportunity to ask questions and all were answered. The patient agreed with the plan and demonstrated an understanding of the instructions.   The patient was advised to call back or seek an in-person evaluation if the symptoms worsen or if the condition fails to improve as anticipated.  The above assessment and management plan was discussed with the patient. The patient verbalized understanding of and has agreed to the management plan. Patient is aware to call the clinic if symptoms persist or worsen. Patient is aware when to return to the clinic for a follow-up visit. Patient educated on when it is appropriate to go to the emergency department.   Time call ended:  1:17 pm   I  provided 14 minutes of non-face-to-face time during this encounter.    Evelina Dun, FNP

## 2019-10-11 ENCOUNTER — Other Ambulatory Visit: Payer: Self-pay | Admitting: Family

## 2019-10-11 DIAGNOSIS — F411 Generalized anxiety disorder: Secondary | ICD-10-CM

## 2019-10-11 DIAGNOSIS — F321 Major depressive disorder, single episode, moderate: Secondary | ICD-10-CM
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
## 2019-10-17 ENCOUNTER — Other Ambulatory Visit: Payer: Self-pay | Admitting: Family

## 2019-10-19 ENCOUNTER — Encounter (HOSPITAL_COMMUNITY): Payer: Self-pay | Admitting: Psychiatry

## 2019-10-19 ENCOUNTER — Other Ambulatory Visit: Payer: Self-pay

## 2019-10-19 ENCOUNTER — Ambulatory Visit (INDEPENDENT_AMBULATORY_CARE_PROVIDER_SITE_OTHER): Payer: Medicaid Other | Admitting: Psychiatry

## 2019-10-19 DIAGNOSIS — F0630 Mood disorder due to known physiological condition, unspecified: Secondary | ICD-10-CM | POA: Diagnosis not present

## 2019-10-19 DIAGNOSIS — F4310 Post-traumatic stress disorder, unspecified: Secondary | ICD-10-CM

## 2019-10-19 MED ORDER — DIAZEPAM 2 MG PO TABS: 1.0000 mg | ORAL_TABLET | Freq: Every day | ORAL | 1 refills | Status: DC

## 2019-10-19 NOTE — Patient Instructions (Signed)
1.Continuecitalopram 40 mg daily  2.Continuelatuda 40 mg daily 3.Continuevalium 1 mg daily as needed for anxiety  4. Continue prazosin 2 mg at night  5. Next appointment: 5/26 at 11 AM for 30 mins, video 6. Obtain blood test at [facility](TSH)

## 2019-10-25 ENCOUNTER — Other Ambulatory Visit: Payer: Medicaid Other

## 2019-10-26 ENCOUNTER — Ambulatory Visit: Payer: Medicaid Other | Attending: Internal Medicine

## 2019-10-26 ENCOUNTER — Other Ambulatory Visit: Payer: Self-pay

## 2019-10-26 DIAGNOSIS — Z20822 Contact with and (suspected) exposure to covid-19: Secondary | ICD-10-CM

## 2019-10-27 LAB — SARS-COV-2, NAA 2 DAY TAT

## 2019-10-27 LAB — NOVEL CORONAVIRUS, NAA: SARS-CoV-2, NAA: NOT DETECTED

## 2019-10-30 ENCOUNTER — Other Ambulatory Visit: Payer: Medicaid Other

## 2019-10-30 ENCOUNTER — Telehealth: Payer: Self-pay | Admitting: *Deleted

## 2019-10-30 ENCOUNTER — Ambulatory Visit: Payer: Medicaid Other

## 2019-10-30 ENCOUNTER — Other Ambulatory Visit: Payer: Self-pay

## 2019-10-30 DIAGNOSIS — Z780 Asymptomatic menopausal state: Secondary | ICD-10-CM | POA: Diagnosis not present

## 2019-10-30 NOTE — Telephone Encounter (Signed)
She called in requesting a copy of her COVID-19 result.   I gave her the number for [facility].  She doesn't know how to use MyChart.

## 2019-10-31 ENCOUNTER — Other Ambulatory Visit: Payer: Self-pay | Admitting: Family

## 2019-10-31 DIAGNOSIS — M8580 Other specified disorders of bone density and structure, unspecified site: Secondary | ICD-10-CM | POA: Insufficient documentation

## 2019-10-31 LAB — CMP14+EGFR
ALT: 41 IU/L — ABNORMAL HIGH (ref 0–32)
AST: 35 IU/L (ref 0–40)
Albumin/Globulin Ratio: 1.6 (ref 1.2–2.2)
Albumin: 4.5 g/dL (ref 3.8–4.9)
Alkaline Phosphatase: 122 IU/L — ABNORMAL HIGH (ref 39–117)
BUN/Creatinine Ratio: 14 (ref 9–23)
BUN: 13 mg/dL (ref 6–24)
Bilirubin Total: 0.4 mg/dL (ref 0.0–1.2)
CO2: 22 mmol/L (ref 20–29)
Calcium: 9.7 mg/dL (ref 8.7–10.2)
Chloride: 98 mmol/L (ref 96–106)
Creatinine, Ser: 0.94 mg/dL (ref 0.57–1.00)
GFR calc Af Amer: 80 mL/min/{1.73_m2} (ref >59)
GFR calc non Af Amer: 69 mL/min/{1.73_m2} (ref >59)
Globulin, Total: 2.9 g/dL (ref 1.5–4.5)
Glucose: 83 mg/dL (ref 65–99)
Potassium: 5.1 mmol/L (ref 3.5–5.2)
Sodium: 137 mmol/L (ref 134–144)
Total Protein: 7.4 g/dL (ref 6.0–8.5)

## 2019-10-31 LAB — CBC WITH DIFFERENTIAL/PLATELET
Basophils Absolute: 0.1 10*3/uL (ref 0.0–0.2)
Basos: 1 %
EOS (ABSOLUTE): 0.2 10*3/uL (ref 0.0–0.4)
Eos: 2 %
Hematocrit: 40.6 % (ref 34.0–46.6)
Hemoglobin: 13.7 g/dL (ref 11.1–15.9)
Immature Grans (Abs): 0.1 10*3/uL (ref 0.0–0.1)
Immature Granulocytes: 1 %
Lymphocytes Absolute: 3.2 10*3/uL — ABNORMAL HIGH (ref 0.7–3.1)
Lymphs: 36 %
MCH: 30.9 pg (ref 26.6–33.0)
MCHC: 33.7 g/dL (ref 31.5–35.7)
MCV: 92 fL (ref 79–97)
Monocytes Absolute: 0.5 10*3/uL (ref 0.1–0.9)
Monocytes: 6 %
Neutrophils Absolute: 4.8 10*3/uL (ref 1.4–7.0)
Neutrophils: 54 %
Platelets: 285 10*3/uL (ref 150–450)
RBC: 4.43 x10E6/uL (ref 3.77–5.28)
RDW: 13 % (ref 11.7–15.4)
WBC: 8.9 10*3/uL (ref 3.4–10.8)

## 2019-10-31 LAB — LIPID PANEL
Chol/HDL Ratio: 2.7 ratio (ref 0.0–4.4)
Cholesterol, Total: 178 mg/dL (ref 100–199)
HDL: 67 mg/dL (ref >39)
LDL Chol Calc (NIH): 90 mg/dL (ref 0–99)
Triglycerides: 118 mg/dL (ref 0–149)
VLDL Cholesterol Cal: 21 mg/dL (ref 5–40)

## 2019-10-31 LAB — TSH: TSH: 1.43 u[IU]/mL (ref 0.450–4.500)

## 2019-11-01 ENCOUNTER — Telehealth: Payer: Self-pay | Admitting: Family

## 2019-11-01 DIAGNOSIS — M159 Polyosteoarthritis, unspecified: Secondary | ICD-10-CM

## 2019-11-01 NOTE — Telephone Encounter (Signed)
[facility] is [facility] the end of June. Needs new pain clinic referral.  Reason for Referral: Chronic pain Referral discussed with patient: yes Best contact number of patient for referral team: [phone]   Has patient been seen by a specialist for this issue before: yes  If so, who (practice/provider): [facility]  Does the patient wish to return: no Patient provider preference for referral: would like [facility] if possible  Patient location preference for referral: [place]

## 2019-11-02 NOTE — Telephone Encounter (Signed)
Referral placed.

## 2019-11-03 ENCOUNTER — Telehealth: Payer: Self-pay | Admitting: Family

## 2019-11-06 ENCOUNTER — Other Ambulatory Visit: Payer: Self-pay | Admitting: Orthopaedic Surgery

## 2019-11-06 ENCOUNTER — Telehealth (HOSPITAL_COMMUNITY): Payer: Self-pay

## 2019-11-06 DIAGNOSIS — S92001B Unspecified fracture of right calcaneus, initial encounter for open fracture: Secondary | ICD-10-CM

## 2019-11-06 NOTE — Telephone Encounter (Signed)
Patient called and stated that she wants to register her dog as a Theme park manager. She's going to get the dog registered and then will call us back with the registration number. Is it ok to do a letter for her to have the dog as a Service Animal? Please review and advise. Thank you.

## 2019-11-06 NOTE — Telephone Encounter (Signed)
We can provide a letter to have her dog as emotional support if that will suffice.

## 2019-11-06 NOTE — Telephone Encounter (Signed)
Pt aware - will contact [facility]

## 2019-11-06 NOTE — Telephone Encounter (Signed)
Pt is seeing [facility], they should be able to complete forms if she meets criteria.

## 2019-11-06 NOTE — Telephone Encounter (Signed)
Patient called requesting a letter to have her dog as a Theme park manager. She's going to register the dog and call us back with the information I.e. registration number. Is it okay to do a letter for her? Please review and advise. Thank you.

## 2019-11-07 ENCOUNTER — Other Ambulatory Visit: Payer: Self-pay | Admitting: Family

## 2019-11-07 DIAGNOSIS — E785 Hyperlipidemia, unspecified: Secondary | ICD-10-CM

## 2019-11-08 ENCOUNTER — Other Ambulatory Visit: Payer: Self-pay | Admitting: Cardiology

## 2019-11-08 ENCOUNTER — Telehealth: Payer: Self-pay | Admitting: Family

## 2019-11-08 DIAGNOSIS — E785 Hyperlipidemia, unspecified: Secondary | ICD-10-CM

## 2019-11-08 MED ORDER — FUROSEMIDE 40 MG PO TABS: ORAL_TABLET | ORAL | 1 refills | Status: DC

## 2019-11-08 MED ORDER — ISOSORBIDE MONONITRATE ER 30 MG PO TB24: 15.0000 mg | ORAL_TABLET | Freq: Every day | ORAL | 1 refills | Status: DC

## 2019-11-08 MED ORDER — ATORVASTATIN CALCIUM 40 MG PO TABS: 40.0000 mg | ORAL_TABLET | Freq: Every day | ORAL | 5 refills | Status: DC

## 2019-11-08 MED ORDER — ATORVASTATIN CALCIUM 40 MG PO TABS: 40.0000 mg | ORAL_TABLET | Freq: Every day | ORAL | 1 refills | Status: DC

## 2019-11-08 NOTE — Telephone Encounter (Signed)
°  Prescription Request  11/08/2019  What is the name of the medication or equipment? atorvastatin (LIPITOR) 40 MG tablet  Have you contacted your pharmacy to request a refill? (if applicable) no switching pharmacy  Which pharmacy would you like this sent to? [facility] in [place]   Patient notified that their request is being sent to the clinical staff for review and that they should receive a response within 2 business days.

## 2019-11-08 NOTE — Telephone Encounter (Signed)
Medication sent to pharmacy  

## 2019-11-08 NOTE — Telephone Encounter (Signed)
Now using [facility] in [place]* If patient is at the pharmacy, call can be transferred to refill team.   1. Which medications need to be refilled? furosemide (LASIX) 40 MG tablet And all cardiac medications    2. Which pharmacy/location (including street and city if local pharmacy) is medication to be sent to? [place], [place]   3. Do they need a 30 day or 90 day supply?

## 2019-11-08 NOTE — Telephone Encounter (Signed)
Pt aware refill sent to pharmacy 

## 2019-11-10 ENCOUNTER — Other Ambulatory Visit: Payer: Self-pay

## 2019-11-10 ENCOUNTER — Other Ambulatory Visit: Payer: Self-pay | Admitting: Family

## 2019-11-10 ENCOUNTER — Ambulatory Visit (HOSPITAL_COMMUNITY)
Admission: RE | Admit: 2019-11-10 | Discharge: 2019-11-10 | Disposition: A | Discharge status: Home / Self Care | Payer: Medicaid Other | Source: Ambulatory Visit | Attending: Orthopaedic Surgery | Admitting: Orthopaedic Surgery

## 2019-11-10 DIAGNOSIS — S92001B Unspecified fracture of right calcaneus, initial encounter for open fracture: Secondary | ICD-10-CM | POA: Insufficient documentation

## 2019-11-17 ENCOUNTER — Telehealth: Payer: Self-pay | Admitting: Family

## 2019-11-17 ENCOUNTER — Other Ambulatory Visit: Payer: Self-pay | Admitting: Family

## 2019-11-17 MED ORDER — ACCU-CHEK GUIDE VI STRP
ORAL_STRIP | 3 refills | Status: DC
Start: 2019-11-17 — End: 2019-12-05

## 2019-11-17 MED ORDER — ACCU-CHEK SOFTCLIX LANCETS MISC
3 refills | Status: DC
Start: 2019-11-17 — End: 2020-10-08

## 2019-11-17 MED ORDER — ACCU-CHEK GUIDE W/DEVICE KIT
PACK | 0 refills | Status: DC
Start: 2019-11-17 — End: 2020-10-08

## 2019-11-17 NOTE — Telephone Encounter (Signed)
Patient notified she can use every 6 hours and she should use on a regular basis for a day or two and then see how she is. Patient verbalized understanding

## 2019-11-17 NOTE — Telephone Encounter (Signed)
  Prescription Request  11/17/2019  What is the name of the medication or equipment? Needs ACCU CHEK meter, lancet and strips called in. Since she was in the hospital her diabetes has came back and needs something to check it with  Have you contacted your pharmacy to request a refill? (if applicable) NO  Which pharmacy would you like this sent to? [facility]   Patient notified that their request is being sent to the clinical staff for review and that they should receive a response within 2 business days.

## 2019-11-17 NOTE — Telephone Encounter (Signed)
Pt aware sent to pharmacy 

## 2019-11-20 ENCOUNTER — Telehealth: Payer: Self-pay | Admitting: Family

## 2019-11-20 NOTE — Telephone Encounter (Signed)
Patient has a follow up appointment scheduled. 

## 2019-11-20 NOTE — Telephone Encounter (Signed)
Please make her an appt with Julie.  

## 2019-11-21 ENCOUNTER — Telehealth: Payer: Self-pay | Admitting: Family

## 2019-11-21 MED ORDER — LINACLOTIDE 145 MCG PO CAPS: 145.0000 ug | ORAL_CAPSULE | Freq: Every day | ORAL | 1 refills | Status: DC

## 2019-11-21 NOTE — Telephone Encounter (Signed)
Left detailed message.   

## 2019-11-21 NOTE — Telephone Encounter (Signed)
[facility] Prescription sent to pharmacy  ° °

## 2019-11-21 NOTE — Telephone Encounter (Signed)
  Incoming Patient Call  11/21/2019  What symptoms do you have? Constipation, hurts so bad makes her want to cry  How long have you been sick? A while since starting steroids in hospital   Have you been seen for this problem? No she is aware she has an apt THURS for HFU but cannot wait  If your provider decides to give you a prescription, which pharmacy would you like for it to be sent to? [facility]   Patient informed that this information will be sent to the clinical staff for review and that they should receive a follow up call.

## 2019-11-21 NOTE — Telephone Encounter (Signed)
Lmtcb.

## 2019-11-22 ENCOUNTER — Ambulatory Visit: Payer: Medicaid Other | Admitting: Pharmacist

## 2019-11-22 ENCOUNTER — Other Ambulatory Visit: Payer: Self-pay

## 2019-11-22 DIAGNOSIS — E785 Hyperlipidemia, unspecified: Secondary | ICD-10-CM
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
## 2019-11-22 NOTE — Progress Notes (Signed)
     11/22/19    S:  24 yoF  presents for diabetes evaluation, education, and management Patient was referred and last seen by Primary Care Provider on 10/10/19 (telephone). Family/Social History: dad has DM Insurance coverage/medication affordability: Medicaid   Patient reports adherence with medications.  Current diabetes medications include: n/a  Current hypertension medications include: isosorbide  Current hyperlipidemia medications include: atorvastatin   Patient reports hypoglycemic events.     Patient reported dietary habits: Eats 2 meals/day Patient starts her day off with coffee She does not eat a lot for breakfast She uses her gift cards for drive thrus--encourge patient to swap out fries for salad and skip half of the bun  Discussed meal planning options and Plate method for healthy eating Avoid sugary drinks and desserts Incorporate balanced protein, non starchy veggies, 1 serving of carbohydrate Increase water intake Increase physical activity as able.  Patient-reported exercise habits: n/a   O:   Lab Results  Component Value Date   HGBA1C 5.0 07/02/2017   Lipid Panel     Component Value Date/Time   CHOL 178 10/30/2019 0932   TRIG 118 10/30/2019 0932   HDL 67 10/30/2019 0932   CHOLHDL 2.7 10/30/2019 0932   CHOLHDL 3.4 11/25/2017 1514   VLDL 44 (H) 11/25/2017 1514   LDLCALC 90 10/30/2019 0932   5/18 BG 714p 108 BG 204P 130 BG 4:59A, 98 BG 4:48A 87 5/17 3:26PM 141 6:53A 64 5/16 BG 5:19A 58 5/14 4:23PM 157   A/P:  Per PCP and labs, patient doesn't currently have diabetes.  She reports being on insulin and diabetes medications in the past.  She states her history of drug use has "messed with her blood sugar".  She is having some lows.  Encouraged patient to eat and be mindful of her diet. Encouraged patient to start exercising.  -Glucometer called in for patient to test sugar.  She has been using her father's meter.  Encouraged pt to call  back and report any concerning BG<70 or >250  -Extensively discussed pathophysiology of diabetes, recommended lifestyle interventions, dietary effects on blood sugar control  -Counseled on s/sx of and management of hypoglycemia  -Encouraged f/u with endocrine     Written patient instructions provided.  Total time in face to face counseling 25 minutes.   Follow up PCP tomorrow for virtual visit  Regina Eck, PharmD, BCPS Clinical Pharmacist, [place]  [facility] Phone [phone]

## 2019-11-23 ENCOUNTER — Encounter: Payer: Self-pay | Admitting: Family

## 2019-11-23 ENCOUNTER — Telehealth (INDEPENDENT_AMBULATORY_CARE_PROVIDER_SITE_OTHER): Payer: Medicaid Other | Admitting: Family

## 2019-11-23 DIAGNOSIS — F17200 Nicotine dependence, unspecified, uncomplicated: Secondary | ICD-10-CM

## 2019-11-23 DIAGNOSIS — J449 Chronic obstructive pulmonary disease, unspecified: Secondary | ICD-10-CM

## 2019-11-23 DIAGNOSIS — J189 Pneumonia, unspecified organism: Secondary | ICD-10-CM | POA: Diagnosis not present

## 2019-11-23 DIAGNOSIS — Z09 Encounter for follow-up examination after completed treatment for conditions other than malignant neoplasm: Secondary | ICD-10-CM | POA: Diagnosis not present

## 2019-11-23 NOTE — Progress Notes (Signed)
Virtual Visit via telephone Note Due to COVID-19 pandemic this visit was conducted virtually. This visit type was conducted due to national recommendations for restrictions regarding the COVID-19 Pandemic (e.g. social distancing, sheltering in place) in an effort to limit this patient's exposure and mitigate transmission in our community. All issues noted in this document were discussed and addressed.  A physical exam was not performed with this format.  I connected with Rachael Watson on 11/23/19 at 9:59 AM by telephone and verified that I am speaking with the correct person using two identifiers. Rachael Watson is currently located at home and no one is currently with her during visit. The provider, Evelina Dun, FNP is located in their office at time of visit.  I discussed the limitations, risks, security and privacy concerns of performing an evaluation and management service by telephone and the availability of in person appointments. I also discussed with the patient that there may be a patient responsible charge related to this service. The patient expressed understanding and agreed to proceed.   History and Present Illness:  Pt calls the office today for hospital follow up. She went to the ED on 11/13/19 with SOB. She was diagnosed with CAP and given steroids and antibiotics.  She was discharged on 11/15/19 on prednisone and antibiotics. She is unsure the name, because she has completed this and thrown the bottle away.   Cough The current episode started 1 to 4 weeks ago. The problem has been gradually improving. The problem occurs every few minutes. The cough is productive of sputum. Associated symptoms include nasal congestion, shortness of breath and wheezing. Pertinent negatives include no chills, ear congestion, ear pain, fever, headaches or postnasal drip. The symptoms are aggravated by lying down. Risk factors for lung disease include smoking/tobacco exposure. She has tried rest and  oral steroids for the symptoms. The treatment provided mild relief. Her past medical history is significant for COPD.  Nicotine Dependence Presents for follow-up visit. Her urge triggers include company of smokers. The symptoms have been stable. She smokes 1 pack of cigarettes per day.    Review of Systems  Constitutional: Negative for chills and fever.  HENT: Negative for ear pain and postnasal drip.   Respiratory: Positive for cough, shortness of breath and wheezing.   Neurological: Negative for headaches.  All other systems reviewed and are negative.    Observations/Objective: No SOB or distress noted  Assessment and Plan: Rachael Watson comes in today with chief complaint of No chief complaint on file.   Diagnosis and orders addressed:  1. Community acquired pneumonia, unspecified laterality - BMP8+EGFR; Future - CBC with Differential/Platelet; Future - DG Chest 2 View; Future  2. Hospital discharge follow-up - BMP8+EGFR; Future - CBC with Differential/Platelet; Future - DG Chest 2 View; Future  3. Chronic obstructive pulmonary disease, unspecified COPD type ([place]) - BMP8+EGFR; Future - CBC with Differential/Platelet; Future - DG Chest 2 View; Future  4. Smoker - BMP8+EGFR; Future - CBC with Differential/Platelet; Future - DG Chest 2 View; Future   Labs pending Will repeat chest x-ray in 3 weeks Smoking cessation discussed Call if symptoms worsen       I discussed the assessment and treatment plan with the patient. The patient was provided an opportunity to ask questions and all were answered. The patient agreed with the plan and demonstrated an understanding of the instructions.   The patient was advised to call back or seek an in-person evaluation if the symptoms worsen or if the  condition fails to improve as anticipated.  The above assessment and management plan was discussed with the patient. The patient verbalized understanding of and has agreed to the  management plan. Patient is aware to call the clinic if symptoms persist or worsen. Patient is aware when to return to the clinic for a follow-up visit. Patient educated on when it is appropriate to go to the emergency department.   Time call ended:  10:23 AM  I provided 24 minutes of non-face-to-face time during this encounter.    Evelina Dun, FNP

## 2019-11-24 ENCOUNTER — Telehealth: Payer: Self-pay | Admitting: Family

## 2019-11-24 ENCOUNTER — Other Ambulatory Visit: Payer: Self-pay | Admitting: Family

## 2019-11-24 NOTE — Telephone Encounter (Signed)
Advise on medicine for dizziness.

## 2019-11-24 NOTE — Telephone Encounter (Signed)
Pt called wanting to know if Alyse Low can write a note stating that pt does not have to wear a mask because of being claustrophobic and asthmatic.

## 2019-11-24 NOTE — Telephone Encounter (Signed)
Video visit yesterday with Annia Friendly Meclizine is not helping with dizziness.  Any other suggestions?

## 2019-11-24 NOTE — Telephone Encounter (Signed)
Aware.  Provider's are advising all patient's to get covid injections .  Immunization against covid means you can go mask free now.

## 2019-11-26 ENCOUNTER — Encounter: Payer: Self-pay | Admitting: Pharmacist

## 2019-11-27 ENCOUNTER — Other Ambulatory Visit: Payer: Self-pay

## 2019-11-27 ENCOUNTER — Other Ambulatory Visit: Payer: Medicaid Other

## 2019-11-27 ENCOUNTER — Other Ambulatory Visit: Payer: Self-pay | Admitting: Family

## 2019-11-27 DIAGNOSIS — J189 Pneumonia, unspecified organism: Secondary | ICD-10-CM

## 2019-11-27 DIAGNOSIS — Z09 Encounter for follow-up examination after completed treatment for conditions other than malignant neoplasm: Secondary | ICD-10-CM

## 2019-11-27 DIAGNOSIS — F17200 Nicotine dependence, unspecified, uncomplicated: Secondary | ICD-10-CM

## 2019-11-27 DIAGNOSIS — J449 Chronic obstructive pulmonary disease, unspecified: Secondary | ICD-10-CM

## 2019-11-27 MED ORDER — MECLIZINE HCL 25 MG PO TABS: 25.0000 mg | ORAL_TABLET | Freq: Three times a day (TID) | ORAL | 2 refills | Status: DC | PRN

## 2019-11-27 NOTE — Telephone Encounter (Signed)
Pt aware of provider feedback and voiced understanding. 

## 2019-11-27 NOTE — Telephone Encounter (Signed)
She can continue to take the antivert. She should also start a daily Zyrtec as this will dry her up and may help with the dizziness.

## 2019-11-27 NOTE — Telephone Encounter (Signed)
Please take daily Calcium 1000mg , and Vitamin  D3 2000-5000 IU to protect your bones. I have sent a new refill of antivert. She should only take the 100 mg a day of this.

## 2019-11-27 NOTE — Telephone Encounter (Signed)
Pt states she is already taking Zyrtec daily and she wants a rx for 100mg  meclizine as that is what she is taking otc now but it cost a lot more to buy it.

## 2019-11-28 LAB — CBC WITH DIFFERENTIAL/PLATELET
Basophils Absolute: 0.1 10*3/uL (ref 0.0–0.2)
Basos: 1 %
EOS (ABSOLUTE): 0 10*3/uL (ref 0.0–0.4)
Eos: 0 %
Hematocrit: 34.1 % (ref 34.0–46.6)
Hemoglobin: 11.2 g/dL (ref 11.1–15.9)
Immature Grans (Abs): 0.2 10*3/uL — ABNORMAL HIGH (ref 0.0–0.1)
Immature Granulocytes: 2 %
Lymphocytes Absolute: 1.8 10*3/uL (ref 0.7–3.1)
Lymphs: 15 %
MCH: 29.9 pg (ref 26.6–33.0)
MCHC: 32.8 g/dL (ref 31.5–35.7)
MCV: 91 fL (ref 79–97)
Monocytes Absolute: 0.6 10*3/uL (ref 0.1–0.9)
Monocytes: 5 %
Neutrophils Absolute: 9.6 10*3/uL — ABNORMAL HIGH (ref 1.4–7.0)
Neutrophils: 77 %
Platelets: 473 10*3/uL — ABNORMAL HIGH (ref 150–450)
RBC: 3.75 x10E6/uL — ABNORMAL LOW (ref 3.77–5.28)
RDW: 13 % (ref 11.7–15.4)
WBC: 12.3 10*3/uL — ABNORMAL HIGH (ref 3.4–10.8)

## 2019-11-28 LAB — BMP8+EGFR
BUN/Creatinine Ratio: 24 — ABNORMAL HIGH (ref 9–23)
BUN: 20 mg/dL (ref 6–24)
CO2: 26 mmol/L (ref 20–29)
Calcium: 9.5 mg/dL (ref 8.7–10.2)
Chloride: 98 mmol/L (ref 96–106)
Creatinine, Ser: 0.83 mg/dL (ref 0.57–1.00)
GFR calc Af Amer: 92 mL/min/{1.73_m2} (ref >59)
GFR calc non Af Amer: 80 mL/min/{1.73_m2} (ref >59)
Glucose: 101 mg/dL — ABNORMAL HIGH (ref 65–99)
Potassium: 5.3 mmol/L — ABNORMAL HIGH (ref 3.5–5.2)
Sodium: 137 mmol/L (ref 134–144)

## 2019-11-29 ENCOUNTER — Telehealth (INDEPENDENT_AMBULATORY_CARE_PROVIDER_SITE_OTHER): Payer: Medicaid Other | Admitting: Psychiatry

## 2019-11-29 ENCOUNTER — Other Ambulatory Visit: Payer: Self-pay

## 2019-11-29 ENCOUNTER — Encounter (HOSPITAL_COMMUNITY): Payer: Self-pay | Admitting: Psychiatry

## 2019-11-29 DIAGNOSIS — F4310 Post-traumatic stress disorder, unspecified: Secondary | ICD-10-CM | POA: Diagnosis not present

## 2019-11-29 DIAGNOSIS — F0630 Mood disorder due to known physiological condition, unspecified: Secondary | ICD-10-CM | POA: Diagnosis not present

## 2019-11-29 MED ORDER — LORAZEPAM 0.5 MG PO TABS: 0.5000 mg | ORAL_TABLET | Freq: Every day | ORAL | 1 refills | Status: DC | PRN

## 2019-11-29 NOTE — Patient Instructions (Addendum)
1.Continuecitalopram 40 mg daily  2.Continuelatuda40 mg daily 3. Discontinue Valium 4. Start lorazepam 0.5 mg daily as needed for anxiety  5 Continue prazosin2 mg at night 5.Next appointment: 7/21 at 11:20

## 2019-11-30 ENCOUNTER — Telehealth: Payer: Self-pay | Admitting: Family

## 2019-11-30 ENCOUNTER — Telehealth (HOSPITAL_COMMUNITY): Payer: Self-pay

## 2019-11-30 NOTE — Telephone Encounter (Signed)
Pt requesting to speak with Julie.  

## 2019-11-30 NOTE — Telephone Encounter (Signed)
Spoke with patient today to follow up on previous conversation petaining to a letter to have her dogs as service animals. She stated that she when she gets the finances to register her dogs she will let us know & give the [place] office the registration numbers to do the letter. Thank you.

## 2019-12-01 ENCOUNTER — Telehealth: Payer: Self-pay | Admitting: *Deleted

## 2019-12-01 NOTE — Telephone Encounter (Signed)
   Primary Cardiologist: Carlyle Dolly, MD  Chart reviewed as part of pre-operative protocol coverage. Patient was contacted 12/01/2019 in reference to pre-operative risk assessment for pending surgery as outlined below.  Rachael Watson was last seen on 05/12/2019 by Dr. Harl Bowie.  Since that day, Rachael Watson has done fine from a cardiac standpoint. Her main issues are orthopedic in nature. She has foot pain which limits walking, for which this surgery is scheduled. Additionally she has knee and hip pain which limits activity. She has chronic DOE and reports undergoing work-up for COPD. No change in her chronic chest pain. She is unable to complete 4 METs. Her last ischemic evaluation was a cardiac cath in 2019 which revealed normal coronary arteries.   Given reassuring work-up in the past 2 years, favor going forward with planned procedure without further cardiovascular testing. Will route to Dr. Harl Bowie for his input and once I hear back will route this recommendation to the requesting party via [place] fax function and remove from pre-op pool.   Abigail Butts, PA-C 12/01/2019, 2:08 PM

## 2019-12-01 NOTE — Telephone Encounter (Signed)
Wells Guiles with [facility] is return Rachael Watson's call to make her aware that general anesthesia will be used during the procedure.

## 2019-12-01 NOTE — Telephone Encounter (Signed)
   [facility] Pre-operative Risk Assessment    HEARTCARE STAFF: - Please ensure there is not already an duplicate clearance open for this procedure. - Under Visit Info/Reason for Call, type in Other and utilize the format Clearance MM/DD/YY or Clearance TBD. Do not use dashes or single digits. - If request is for dental extraction, please clarify the # of teeth to be extracted.  Request for surgical clearance:  1. What type of surgery is being performed? 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 When is this surgery scheduled? 12/14/19   3. What type of clearance is required (medical clearance vs. Pharmacy clearance to hold med vs. Both)? MEDICAL  4. Are there any medications that need to be held prior to surgery and how long? ASA    5. Practice name and name of physician performing surgery? [facility]; DR. Gerald Stabs ADAIR   6. What is the office phone number? [phone]   7.   What is the office fax number? [phone]  8.   Anesthesia type (None, local, MAC, general) ? LEFT MESSAGE FOR SURGERY SCHEDULER REBECCA TO PLEASE CALL OUR WITH THE TYPE OF ANESTHESIA BEING USED.    Julaine Hua 12/01/2019, 1:25 PM  _________________________________________________________________   (provider comments below)

## 2019-12-05 ENCOUNTER — Encounter: Payer: Self-pay | Admitting: Family

## 2019-12-05 ENCOUNTER — Other Ambulatory Visit: Payer: Self-pay | Admitting: Orthopaedic Surgery

## 2019-12-05 ENCOUNTER — Ambulatory Visit (INDEPENDENT_AMBULATORY_CARE_PROVIDER_SITE_OTHER): Payer: Medicaid Other | Admitting: Family

## 2019-12-05 ENCOUNTER — Other Ambulatory Visit: Payer: Self-pay | Admitting: Family

## 2019-12-05 DIAGNOSIS — J441 Chronic obstructive pulmonary disease with (acute) exacerbation: Secondary | ICD-10-CM

## 2019-12-05 DIAGNOSIS — F17200 Nicotine dependence, unspecified, uncomplicated: Secondary | ICD-10-CM | POA: Diagnosis not present

## 2019-12-05 DIAGNOSIS — R0602 Shortness of breath: Secondary | ICD-10-CM

## 2019-12-05 MED ORDER — PREDNISONE 10 MG (21) PO TBPK: ORAL_TABLET | ORAL | 0 refills | Status: DC

## 2019-12-05 MED ORDER — ALBUTEROL SULFATE HFA 108 (90 BASE) MCG/ACT IN AERS: INHALATION_SPRAY | RESPIRATORY_TRACT | 0 refills | Status: DC

## 2019-12-05 MED ORDER — DOXYCYCLINE HYCLATE 100 MG PO TABS: 100.0000 mg | ORAL_TABLET | Freq: Two times a day (BID) | ORAL | 0 refills | Status: DC

## 2019-12-05 MED ORDER — ACCU-CHEK GUIDE VI STRP: ORAL_STRIP | 3 refills | Status: DC

## 2019-12-05 NOTE — Telephone Encounter (Signed)
Refills sent

## 2019-12-05 NOTE — Progress Notes (Signed)
Virtual Visit via telephone Note Due to COVID-19 pandemic this visit was conducted virtually. This visit type was conducted due to national recommendations for restrictions regarding the COVID-19 Pandemic (e.g. social distancing, sheltering in place) in an effort to limit this patient's exposure and mitigate transmission in our community. All issues noted in this document were discussed and addressed.  A physical exam was not performed with this format.  I connected with Rachael Watson on 12/05/19 at 11:44 AM by telehophone and verified that I am speaking with the correct person using two identifiers. Rachael Watson is currently located at home and no one is currently with her during visit. The provider, Evelina Dun, FNP is located in their office at time of visit.  I discussed the limitations, risks, security and privacy concerns of performing an evaluation and management service by telephone and the availability of in person appointments. I also discussed with the patient that there may be a patient responsible charge related to this service. The patient expressed understanding and agreed to proceed.   History and Present Illness:  Shortness of Breath This is a recurrent problem. The current episode started 1 to 4 weeks ago. The problem occurs intermittently. The problem has been waxing and waning. Associated symptoms include chest pain, a sore throat and wheezing. Pertinent negatives include no fever or headaches. The symptoms are aggravated by smoke. She has tried OTC cough suppressants for the symptoms. The treatment provided mild relief. Her past medical history is significant for COPD.      Review of Systems  Constitutional: Negative for fever.  HENT: Positive for sore throat.   Respiratory: Positive for shortness of breath and wheezing.   Cardiovascular: Positive for chest pain.  Neurological: Negative for headaches.     Observations/Objective: No SOB or distress noted, hoarse  voice  Assessment and Plan: [name] comes in today with chief complaint of No chief complaint on file.   Diagnosis and orders addressed:  1. COPD exacerbation (HCC) - predniSONE (STERAPRED UNI-PAK 21 TAB) 10 MG (21) TBPK tablet; Use as directed  Dispense: 21 tablet; Refill: 0 - doxycycline (VIBRA-TABS) 100 MG tablet; Take 1 tablet (100 mg total) by mouth 2 (two) times daily.  Dispense: 20 tablet; Refill: 0 - Ambulatory referral to Pulmonology - DG Chest 2 View; Future  2. SOB (shortness of breath) - predniSONE (STERAPRED UNI-PAK 21 TAB) 10 MG (21) TBPK tablet; Use as directed  Dispense: 21 tablet; Refill: 0 - doxycycline (VIBRA-TABS) 100 MG tablet; Take 1 tablet (100 mg total) by mouth 2 (two) times daily.  Dispense: 20 tablet; Refill: 0 - Ambulatory referral to Pulmonology - DG Chest 2 View; Future  3. Current smoker   Given she had CAP 3 weeks ago, we will treat with doxycycline. She will repeat chest x-ray. Smoking cessation discussed. She does wish to have a referral for Pulmonologist.        I discussed the assessment and treatment plan with the patient. The patient was provided an opportunity to ask questions and all were answered. The patient agreed with the plan and demonstrated an understanding of the instructions.   The patient was advised to call back or seek an in-person evaluation if the symptoms worsen or if the condition fails to improve as anticipated.  The above assessment and management plan was discussed with the patient. The patient verbalized understanding of and has agreed to the management plan. Patient is aware to call the clinic if symptoms persist or worsen. Patient is  aware when to return to the clinic for a follow-up visit. Patient educated on when it is appropriate to go to the emergency department.   Time call ended:  11:57 AM  I provided 13 minutes of non-face-to-face time during this encounter.    Evelina Dun, FNP

## 2019-12-06 ENCOUNTER — Telehealth: Payer: Self-pay | Admitting: *Deleted

## 2019-12-06 ENCOUNTER — Ambulatory Visit (HOSPITAL_COMMUNITY)
Admission: RE | Admit: 2019-12-06 | Discharge: 2019-12-06 | Disposition: A | Discharge status: Home / Self Care | Payer: Medicaid Other | Source: Ambulatory Visit | Attending: Family | Admitting: Family

## 2019-12-06 ENCOUNTER — Other Ambulatory Visit: Payer: Self-pay

## 2019-12-06 ENCOUNTER — Other Ambulatory Visit: Payer: Self-pay | Admitting: Family

## 2019-12-06 DIAGNOSIS — J189 Pneumonia, unspecified organism: Secondary | ICD-10-CM

## 2019-12-06 DIAGNOSIS — R0602 Shortness of breath: Secondary | ICD-10-CM | POA: Diagnosis present

## 2019-12-06 DIAGNOSIS — J441 Chronic obstructive pulmonary disease with (acute) exacerbation: Secondary | ICD-10-CM | POA: Diagnosis not present

## 2019-12-06 DIAGNOSIS — R911 Solitary pulmonary nodule: Secondary | ICD-10-CM

## 2019-12-06 NOTE — Telephone Encounter (Signed)
Requesting chest xray results

## 2019-12-06 NOTE — Telephone Encounter (Signed)
Patient made aware to hold Aspirin 5-7 days prior to procedure.

## 2019-12-06 NOTE — Telephone Encounter (Signed)
Patient stating her BGs are still dropping.  Encouraged patient to eat 3 steady/DM mindful meals.  Informed her that test strips (accucheck guide) were sent to [facility] to test sugar daily as directed

## 2019-12-06 NOTE — Telephone Encounter (Signed)
I agree with proceeding with surgery   Zandra Abts MD

## 2019-12-06 NOTE — Telephone Encounter (Signed)
   Primary Cardiologist: Carlyle Dolly, MD  Chart reviewed as part of pre-operative protocol coverage. Given past medical history and time since last visit, based on ACC/AHA guidelines, Rachael Watson would be at acceptable risk for the planned procedure without further cardiovascular testing.   I will route this recommendation to the requesting party via Epic fax function and remove from pre-op pool.  Please call with questions. May hold aspirin for 5-7 days prior to the surgery and restart as soon as possible afterward.at the surgeon's discretion.   [place], [place] 12/06/2019, 2:55 PM

## 2019-12-08 ENCOUNTER — Ambulatory Visit: Payer: Medicaid Other | Admitting: Family

## 2019-12-08 ENCOUNTER — Telehealth: Payer: Self-pay | Admitting: *Deleted

## 2019-12-08 NOTE — Telephone Encounter (Signed)
Patient called and LMOM and wants to know if she can cut back on her Ativan a little bit by cutting it in half and take it. Per pt it's a "doozie". [phone] is patient number.

## 2019-12-08 NOTE — Telephone Encounter (Signed)
Yes, that's fine 

## 2019-12-11 ENCOUNTER — Telehealth: Payer: Self-pay | Admitting: Family

## 2019-12-11 MED ORDER — HYDROXYZINE HCL 10 MG PO TABS: 10.0000 mg | ORAL_TABLET | Freq: Three times a day (TID) | ORAL | 1 refills | Status: DC | PRN

## 2019-12-11 NOTE — Telephone Encounter (Signed)
Patient states that she would like to have a lower dose of Hydroxyzine sent to the pharmacy.  She has been breaking the capsules in 1/2

## 2019-12-11 NOTE — Telephone Encounter (Signed)
Vistaril decreased to 10 mg, Prescription sent to pharmacy

## 2019-12-11 NOTE — Telephone Encounter (Signed)
Spoke with patient and she stated that everything is good now. Per pt, she is taking a whole pill at bedtime and it is helping her sleep

## 2019-12-12 ENCOUNTER — Encounter: Payer: Self-pay | Admitting: Family

## 2019-12-14 ENCOUNTER — Ambulatory Visit: Admit: 2019-12-14 | Payer: Medicaid Other | Admitting: Orthopaedic Surgery

## 2019-12-14 ENCOUNTER — Encounter: Payer: Self-pay | Admitting: Family

## 2019-12-14 ENCOUNTER — Other Ambulatory Visit: Payer: Self-pay

## 2019-12-14 ENCOUNTER — Ambulatory Visit: Payer: Medicaid Other | Admitting: Family

## 2019-12-14 ENCOUNTER — Ambulatory Visit (INDEPENDENT_AMBULATORY_CARE_PROVIDER_SITE_OTHER): Payer: Medicaid Other

## 2019-12-14 VITALS — BP 107/67 | HR 90 | Temp 98.0°F | Ht 66.0 in | Wt 201.4 lb

## 2019-12-14 DIAGNOSIS — F17200 Nicotine dependence, unspecified, uncomplicated: Secondary | ICD-10-CM | POA: Diagnosis not present

## 2019-12-14 DIAGNOSIS — R1012 Left upper quadrant pain: Secondary | ICD-10-CM

## 2019-12-14 DIAGNOSIS — K5900 Constipation, unspecified: Secondary | ICD-10-CM

## 2019-12-14 DIAGNOSIS — K219 Gastro-esophageal reflux disease without esophagitis: Secondary | ICD-10-CM | POA: Diagnosis not present

## 2019-12-14 SURGERY — OPEN REDUCTION INTERNAL FIXATION (ORIF) CALCANEOUS FRACTURE
Anesthesia: General | Laterality: Right

## 2019-12-14 MED ORDER — PANTOPRAZOLE SODIUM 40 MG PO TBEC: 40.0000 mg | DELAYED_RELEASE_TABLET | Freq: Every day | ORAL | 2 refills | Status: DC

## 2019-12-14 MED ORDER — LINACLOTIDE 290 MCG PO CAPS: 290.0000 ug | ORAL_CAPSULE | Freq: Every day | ORAL | 1 refills | Status: DC

## 2019-12-14 NOTE — Patient Instructions (Signed)
Gastroesophageal Reflux Disease, Adult Gastroesophageal reflux (GER) happens when acid from the stomach flows up into the tube that connects the mouth and the stomach (esophagus). Normally, food travels down the esophagus and stays in the stomach to be digested. However, when a person has GER, food and stomach acid sometimes move back up into the esophagus. If this becomes a more serious problem, the person may be diagnosed with a disease called gastroesophageal reflux disease (GERD). GERD occurs when the reflux:  Happens often.  Causes frequent or severe symptoms.  Causes problems such as damage to the esophagus. When stomach acid comes in contact with the esophagus, the acid may cause soreness (inflammation) in the esophagus. Over time, GERD may create small holes (ulcers) in the lining of the esophagus. What are the causes? This condition is caused by a problem with the muscle between the esophagus and the stomach (lower esophageal sphincter, or LES). Normally, the LES muscle closes after food passes through the esophagus to the stomach. When the LES is weakened or abnormal, it does not close properly, and that allows food and stomach acid to go back up into the esophagus. The LES can be weakened by certain dietary substances, medicines, and medical conditions, including:  Tobacco use.  Pregnancy.  Having a hiatal hernia.  Alcohol use.  Certain foods and beverages, such as coffee, chocolate, onions, and peppermint. What increases the risk? You are more likely to develop this condition if you:  Have an increased body weight.  Have a connective tissue disorder.  Use NSAID medicines. What are the signs or symptoms? Symptoms of this condition include:  Heartburn.  Difficult or painful swallowing.  The feeling of having a lump in the throat.  Abitter taste in the mouth.  Bad breath.  Having a large amount of saliva.  Having an upset or bloated  stomach.  Belching.  Chest pain. Different conditions can cause chest pain. Make sure you see your health care provider if you experience chest pain.  Shortness of breath or wheezing.  Ongoing (chronic) cough or a night-time cough.  Wearing away of tooth enamel.  Weight loss. How is this diagnosed? Your health care provider will take a medical history and perform a physical exam. To determine if you have mild or severe GERD, your health care provider may also monitor how you respond to treatment. You may also have tests, including:  A test to examine your stomach and esophagus with a small camera (endoscopy).  A test thatmeasures the acidity level in your esophagus.  A test thatmeasures how much pressure is on your esophagus.  A barium swallow or modified barium swallow test to show the shape, size, and functioning of your esophagus. How is this treated? The goal of treatment is to help relieve your symptoms and to prevent complications. Treatment for this condition may vary depending on how severe your symptoms are. Your health care provider may recommend:  Changes to your diet.  Medicine.  Surgery. Follow these instructions at home: Eating and drinking   Follow a diet as recommended by your health care provider. This may involve avoiding foods and drinks such as: ? Coffee and tea (with or without caffeine). ? Drinks that containalcohol. ? Energy drinks and sports drinks. ? Carbonated drinks or sodas. ? Chocolate and cocoa. ? Peppermint and mint flavorings. ? Garlic and onions. ? Horseradish. ? Spicy and acidic foods, including peppers, chili powder, curry powder, vinegar, hot sauces, and barbecue sauce. ? Citrus fruit juices and citrus   fruits, such as oranges, lemons, and limes. ? Tomato-based foods, such as red sauce, chili, salsa, and pizza with red sauce. ? Fried and fatty foods, such as donuts, french fries, potato chips, and high-fat dressings. ? High-fat  meats, such as hot dogs and fatty cuts of red and white meats, such as rib eye steak, sausage, ham, and bacon. ? High-fat dairy items, such as whole milk, butter, and cream cheese.  Eat small, frequent meals instead of large meals.  Avoid drinking large amounts of liquid with your meals.  Avoid eating meals during the 2-3 hours before bedtime.  Avoid lying down right after you eat.  Do not exercise right after you eat. Lifestyle   Do not use any products that contain nicotine or tobacco, such as cigarettes, e-cigarettes, and chewing tobacco. If you need help quitting, ask your health care provider.  Try to reduce your stress by using methods such as yoga or meditation. If you need help reducing stress, ask your health care provider.  If you are overweight, reduce your weight to an amount that is healthy for you. Ask your health care provider for guidance about a safe weight loss goal. General instructions  Pay attention to any changes in your symptoms.  Take over-the-counter and prescription medicines only as told by your health care provider. Do not take aspirin, ibuprofen, or other NSAIDs unless your health care provider told you to do so.  Wear loose-fitting clothing. Do not wear anything tight around your waist that causes pressure on your abdomen.  Raise (elevate) the head of your bed about 6 inches (15 cm).  Avoid bending over if this makes your symptoms worse.  Keep all follow-up visits as told by your health care provider. This is important. Contact a health care provider if:  You have: ? New symptoms. ? Unexplained weight loss. ? Difficulty swallowing or it hurts to swallow. ? Wheezing or a persistent cough. ? A hoarse voice.  Your symptoms do not improve with treatment. Get help right away if you:  Have pain in your arms, neck, jaw, teeth, or back.  Feel sweaty, dizzy, or light-headed.  Have chest pain or shortness of breath.  Vomit and your vomit looks  like blood or coffee grounds.  Faint.  Have stool that is bloody or black.  Cannot swallow, drink, or eat. Summary  Gastroesophageal reflux happens when acid from the stomach flows up into the esophagus. GERD is a disease in which the reflux happens often, causes frequent or severe symptoms, or causes problems such as damage to the esophagus.  Treatment for this condition may vary depending on how severe your symptoms are. Your health care provider may recommend diet and lifestyle changes, medicine, or surgery.  Contact a health care provider if you have new or worsening symptoms.  Take over-the-counter and prescription medicines only as told by your health care provider. Do not take aspirin, ibuprofen, or other NSAIDs unless your health care provider told you to do so.  Keep all follow-up visits as told by your health care provider. This is important. This information is not intended to replace advice given to you by your health care provider. Make sure you discuss any questions you have with your health care provider. Document Revised: 12/29/2017 Document Reviewed: 12/29/2017 Elsevier Patient Education  [place]. Constipation, Adult Constipation is when a person has fewer bowel movements in a week than normal, has difficulty having a bowel movement, or has stools that are dry, hard, or  larger than normal. Constipation may be caused by an underlying condition. It may become worse with age if a person takes certain medicines and does not take in enough fluids. Follow these instructions at home: Eating and drinking   Eat foods that have a lot of fiber, such as fresh fruits and vegetables, whole grains, and beans.  Limit foods that are high in fat, low in fiber, or overly processed, such as french fries, hamburgers, cookies, candies, and soda.  Drink enough fluid to keep your urine clear or pale yellow. General instructions  Exercise regularly or as told by your health care  provider.  Go to the restroom when you have the urge to go. Do not hold it in.  Take over-the-counter and prescription medicines only as told by your health care provider. These include any fiber supplements.  Practice pelvic floor retraining exercises, such as deep breathing while relaxing the lower abdomen and pelvic floor relaxation during bowel movements.  Watch your condition for any changes.  Keep all follow-up visits as told by your health care provider. This is important. Contact a health care provider if:  You have pain that gets worse.  You have a fever.  You do not have a bowel movement after 4 days.  You vomit.  You are not hungry.  You lose weight.  You are bleeding from the anus.  You have thin, pencil-like stools. Get help right away if:  You have a fever and your symptoms suddenly get worse.  You leak stool or have blood in your stool.  Your abdomen is bloated.  You have severe pain in your abdomen.  You feel dizzy or you faint. This information is not intended to replace advice given to you by your health care provider. Make sure you discuss any questions you have with your health care provider. Document Revised: 06/04/2017 Document Reviewed: 12/11/2015 Elsevier Patient Education  2020 [employer].

## 2019-12-14 NOTE — Progress Notes (Signed)
Subjective:    Patient ID: Rachael Watson, female    DOB: 1965-07-29, 54 y.o.   MRN: [id number]  Chief Complaint  Patient presents with  . Constipation    patient has taken laxitive stool softner. been going on for years     Constipation This is a chronic problem. The current episode started more than 1 year ago. The problem is unchanged. Her stool frequency is 1 time per day. Associated symptoms include abdominal pain, bloating, flatus, hemorrhoids and nausea. Pertinent negatives include no vomiting. She has tried laxatives, fiber, stool softeners and diet changes for the symptoms. The treatment provided mild relief.  Gastroesophageal Reflux She complains of abdominal pain, belching, heartburn and nausea. The current episode started more than 1 year ago. The problem occurs frequently. The problem has been waxing and waning. The heartburn is located in the LUQ. The heartburn is of moderate intensity.  Abdominal Pain This is a new problem. The current episode started 1 to 4 weeks ago. The onset quality is gradual. The problem occurs intermittently. The pain is located in the LUQ. The pain is moderate. The quality of the pain is aching. Associated symptoms include belching, constipation, flatus and nausea. Pertinent negatives include no vomiting. Her past medical history is significant for GERD.      Review of Systems  Gastrointestinal: Positive for abdominal pain, bloating, constipation, flatus, heartburn, hemorrhoids and nausea. Negative for vomiting.  All other systems reviewed and are negative.      Objective:   Physical Exam Vitals reviewed.  Constitutional:      General: She is not in acute distress.    Appearance: She is well-developed.  HENT:     Head: Normocephalic and atraumatic.     Right Ear: Tympanic membrane normal.     Left Ear: Tympanic membrane normal.  Eyes:     Pupils: Pupils are equal, round, and reactive to light.  Neck:     Thyroid: No thyromegaly.    Cardiovascular:     Rate and Rhythm: Normal rate and regular rhythm.     Heart sounds: Normal heart sounds. No murmur heard.   Pulmonary:     Effort: Pulmonary effort is normal. No respiratory distress.     Breath sounds: Normal breath sounds. No wheezing.  Abdominal:     General: Bowel sounds are normal. There is no distension.     Palpations: Abdomen is soft.     Tenderness: There is abdominal tenderness (LUQ).  Musculoskeletal:        General: No tenderness. Normal range of motion.     Cervical back: Normal range of motion and neck supple.  Skin:    General: Skin is warm and dry.  Neurological:     Mental Status: She is alert and oriented to person, place, and time.     Cranial Nerves: No cranial nerve deficit.     Deep Tendon Reflexes: Reflexes are normal and symmetric.  Psychiatric:        Behavior: Behavior normal.        Thought Content: Thought content normal.        Judgment: Judgment normal.     BP 107/67   Pulse 90   Temp 98 F (36.7 C) (Temporal)   Ht 5\' 6"  (1.676 m)   Wt 201 lb 6.4 oz (91.4 kg)   SpO2 96%   BMI 32.51 kg/m      Assessment & Plan:  [name] comes in today with chief complaint of Constipation (patient  has taken laxitive stool softner. been going on for years ) and Dizziness [facility] WANTS CHANGED)   Diagnosis and orders addressed:  1. Constipation, unspecified constipation type Will increase Linzess 290 mg from 140 mg Force fluids Healthy diet encouraged Encouraged exercise  - DG Abd 1 View - linaclotide (LINZESS) 290 MCG CAPS capsule; Take 1 capsule (290 mcg total) by mouth daily before breakfast.  Dispense: 90 capsule; Refill: 1 - CBC with Differential/Platelet  2. Left upper quadrant abdominal pain - CBC with Differential/Platelet  3. Current smoker Smoking cessation d - CBC with Differential/Platelet  4. Gastroesophageal reflux disease, unspecified whether esophagitis present Will stop Omeprazole and start Protonix  40 mg - pantoprazole (PROTONIX) 40 MG tablet; Take 1 tablet (40 mg total) by mouth daily.  Dispense: 90 tablet; Refill: 2 - CBC with Differential/Platelet   Labs pending Health Maintenance reviewed Diet and exercise encouraged  Follow up plan:  1 month    Evelina Dun, FNP

## 2019-12-15 ENCOUNTER — Telehealth: Payer: Self-pay | Admitting: Family

## 2019-12-15 LAB — CBC WITH DIFFERENTIAL/PLATELET
Basophils Absolute: 0.1 10*3/uL (ref 0.0–0.2)
Basos: 1 %
EOS (ABSOLUTE): 0 10*3/uL (ref 0.0–0.4)
Eos: 0 %
Hematocrit: 38.2 % (ref 34.0–46.6)
Hemoglobin: 12.4 g/dL (ref 11.1–15.9)
Lymphocytes Absolute: 3.1 10*3/uL (ref 0.7–3.1)
Lymphs: 22 %
MCH: 29.8 pg (ref 26.6–33.0)
MCHC: 32.5 g/dL (ref 31.5–35.7)
MCV: 92 fL (ref 79–97)
Monocytes Absolute: 0.8 10*3/uL (ref 0.1–0.9)
Monocytes: 6 %
Neutrophils Absolute: 8.3 10*3/uL — ABNORMAL HIGH (ref 1.4–7.0)
Neutrophils: 60 %
Platelets: 335 10*3/uL (ref 150–450)
RBC: 4.16 x10E6/uL (ref 3.77–5.28)
RDW: 13.2 % (ref 11.7–15.4)
WBC: 13.9 10*3/uL — ABNORMAL HIGH (ref 3.4–10.8)

## 2019-12-15 LAB — IMMATURE CELLS
MYELOCYTES: 2 % — ABNORMAL HIGH (ref 0–0)
Metamyelocytes: 9 % — ABNORMAL HIGH (ref 0–0)

## 2019-12-15 NOTE — Telephone Encounter (Signed)
lmtcb

## 2019-12-15 NOTE — Telephone Encounter (Signed)
Patient advised of labs and verbalized understanding.

## 2019-12-18 ENCOUNTER — Telehealth: Payer: Self-pay | Admitting: Family

## 2019-12-18 NOTE — Telephone Encounter (Signed)
Patient experiencing hoarseness, shortness of breath and cough for the last several weeks.  Has been on steroids and antibiotic.  Scheduled patient a tele visit on 12/19/2019 with Hendricks Limes.

## 2019-12-19 ENCOUNTER — Ambulatory Visit (INDEPENDENT_AMBULATORY_CARE_PROVIDER_SITE_OTHER): Payer: Medicaid Other | Admitting: Family Medicine

## 2019-12-19 ENCOUNTER — Encounter: Payer: Self-pay | Admitting: Family Medicine

## 2019-12-19 DIAGNOSIS — Z8701 Personal history of pneumonia (recurrent): Secondary | ICD-10-CM | POA: Diagnosis not present

## 2019-12-19 DIAGNOSIS — F17200 Nicotine dependence, unspecified, uncomplicated: Secondary | ICD-10-CM

## 2019-12-19 MED ORDER — NICOTINE 21 MG/24HR TD PT24: 21.0000 mg | MEDICATED_PATCH | Freq: Every day | TRANSDERMAL | 1 refills | Status: DC

## 2019-12-19 NOTE — Progress Notes (Signed)
Virtual Visit via Telephone Note  I connected with Rachael Watson on 12/19/19 at 9:25 AM by telephone and verified that I am speaking with the correct person using two identifiers. Rachael Watson is currently located at home and her father is currently with her during this visit. The provider, Loman [place], FNP is located in their home at time of visit.  I discussed the limitations, risks, security and privacy concerns of performing an evaluation and management service by telephone and the availability of in person appointments. I also discussed with the patient that there may be a patient responsible charge related to this service. The patient expressed understanding and agreed to proceed.  Subjective: PCP: Sharion Balloon, FNP  Chief Complaint  Patient presents with  . URI   Patient reports she developed pneumonia after receiving the COVID-19 vaccine.  Upon chart review it appears she was seen in the ER 11/13/2019 at which time she was diagnosed with CAP and given steroids and antibiotics.  She had a virtual visit on 12/05/2019 at which time she was given another round of steroids and antibiotic.  On 6-21 her chest x-ray showed near complete clearing of infiltrate from the left upper lobe compared to prior study on 11/13/2019.  It did also note an area of opacity in the left perihilar region that had a subtly nodular appearance.  It was recommended she have a repeat study in 10 to 14 days to assess for clearing.  Patient has not yet repeated her chest x-ray.  She was seen in the ER again on 12/17/2019 requesting penicillin to finish clearing her infection.  Patient reports when she was told she was not going to get a dose of penicillin she got up and left AMA.  Today she is also requesting penicillin.  When asked what symptoms she still has she reports hoarseness and a productive cough "every now and then".  Patient is also requesting nicotine patches as she would like to try to quit  smoking.   ROS: Per HPI  Current Outpatient Medications:  .  Accu-Chek Softclix Lancets lancets, Test BS daily and as needed Dx E11.9, Disp: 100 each, Rfl: 3 .  albuterol (VENTOLIN HFA) 108 (90 Base) MCG/ACT inhaler, INHALE 2 PUFFS INTO THE LUNGS EVERY 6 HOURS AS NEEDED FOR WHEEZING ORSHORTNESS OF BREATH., Disp: 8.5 g, Rfl: 0 .  aspirin 81 MG EC tablet, Take 81 mg by mouth daily.  (Patient not taking: Reported on 12/14/2019), Disp: , Rfl:  .  atorvastatin (LIPITOR) 40 MG tablet, Take 1 tablet (40 mg total) by mouth daily., Disp: 90 tablet, Rfl: 1 .  Blood Glucose Monitoring Suppl (ACCU-CHEK GUIDE) w/Device KIT, Test BS daily and as needed Dx E11.9, Disp: 1 kit, Rfl: 0 .  Calcium Carb-Cholecalciferol (CALCIUM 1000 + D PO), Take 1 tablet by mouth daily., Disp: , Rfl:  .  carboxymethylcellulose (REFRESH PLUS) 0.5 % SOLN, Place 1 drop into both eyes 3 (three) times daily as needed. , Disp: , Rfl:  .  cetirizine (ZYRTEC) 10 MG tablet, Take 10 mg by mouth daily., Disp: , Rfl:  .  Cholecalciferol (VITAMIN D) 50 MCG (2000 UT) tablet, Take 2,000 Units by mouth daily., Disp: , Rfl:  .  citalopram (CELEXA) 40 MG tablet, TAKE 1 TABLET ONCE DAILY. (Patient taking differently: Take 40 mg by mouth daily. ), Disp: 30 tablet, Rfl: 2 .  cyclobenzaprine (FLEXERIL) 10 MG tablet, Take 10 mg by mouth 3 (three) times daily as needed for muscle  spasms., Disp: , Rfl:  .  doxycycline (VIBRA-TABS) 100 MG tablet, Take 1 tablet (100 mg total) by mouth 2 (two) times daily., Disp: 20 tablet, Rfl: 0 .  furosemide (LASIX) 40 MG tablet, TAKE 1 TABLET ONCE A DAY. MAY TAKE AN ADDITIONAL 1/2-1 TABLET AS NEEDED FOR SWELLING., Disp: 45 tablet, Rfl: 1 .  gabapentin (NEURONTIN) 300 MG capsule, Take 1 capsule (300 mg total) by mouth 3 (three) times daily., Disp: 90 capsule, Rfl: 1 .  glucose blood (ACCU-CHEK GUIDE) test strip, Test BS daily and as needed Dx E11.9, Disp: 100 each, Rfl: 3 .  hydrOXYzine (ATARAX/VISTARIL) 10 MG tablet, Take  1 tablet (10 mg total) by mouth 3 (three) times daily as needed., Disp: 90 tablet, Rfl: 1 .  isosorbide mononitrate (IMDUR) 30 MG 24 hr tablet, Take 0.5 tablets (15 mg total) by mouth daily., Disp: 45 tablet, Rfl: 1 .  levothyroxine (SYNTHROID) 88 MCG tablet, Take 1 tablet (88 mcg total) by mouth daily. (Patient taking differently: Take 88 mcg by mouth daily before breakfast. ), Disp: 90 tablet, Rfl: 3 .  linaclotide (LINZESS) 290 MCG CAPS capsule, Take 1 capsule (290 mcg total) by mouth daily before breakfast., Disp: 90 capsule, Rfl: 1 .  LORazepam (ATIVAN) 0.5 MG tablet, Take 1 tablet (0.5 mg total) by mouth daily as needed for anxiety., Disp: 30 tablet, Rfl: 1 .  lurasidone (LATUDA) 40 MG TABS tablet, Take 1 tablet (40 mg total) by mouth daily with breakfast., Disp: 90 tablet, Rfl: 0 .  meclizine (ANTIVERT) 25 MG tablet, Take 1 tablet (25 mg total) by mouth 3 (three) times daily as needed for dizziness. (Patient taking differently: Take 25-50 mg by mouth See admin instructions. 50 mg in the morning, 25 mg in the afternoon, and 25 mg at bedtime), Disp: 120 tablet, Rfl: 2 .  Multiple Vitamins-Minerals (MULTIVITAMIN WITH MINERALS) tablet, Take 1 tablet by mouth daily., Disp: , Rfl:  .  oxyCODONE-acetaminophen (PERCOCET) 10-325 MG tablet, Take 1 tablet by mouth 4 (four) times daily as needed for pain., Disp: , Rfl:  .  pantoprazole (PROTONIX) 40 MG tablet, Take 1 tablet (40 mg total) by mouth daily., Disp: 90 tablet, Rfl: 2 .  prazosin (MINIPRESS) 2 MG capsule, Take 1 capsule (2 mg total) by mouth at bedtime., Disp: 90 capsule, Rfl: 0 .  RESTASIS 0.05 % ophthalmic emulsion, INSTILL 1 DROP INTO BOTH EYES TWICE DAILY. (Patient taking differently: Place 1 drop into both eyes 2 (two) times daily. ), Disp: 60 each, Rfl: 0  Allergies  Allergen Reactions  . Abilify [Aripiprazole] Other (See Comments)    Violent behavior  . Quetiapine Fumarate Other (See Comments)    Pt felt paralyzed   . Risperidone Other  (See Comments)    Unknown  . Seroquel [Quetiapine] Other (See Comments)    Tremors and unable to swallow   Past Medical History:  Diagnosis Date  . Anxiety   . Chest pain, precordial 12/17/2017  . CHF (congestive heart failure) ([place])    2009  . Diabetes mellitus without complication ([place])     diet control 1 year ago  . GERD (gastroesophageal reflux disease)   . Thyroid disease     Observations/Objective: A&O  No respiratory distress or wheezing audible over the phone Mood, judgement, and thought processes all WNL   Assessment and Plan: 1. History of recent pneumonia - Discussed with patient that she has already completed 2 courses of antibiotics and steroids and her most recent chest x-ray showed  near complete clearing of the infiltrate.  I do not believe she needs another antibiotic at this time.  Discussed that a cough can linger from pneumonia for months.  Encouraged her to purchase and take Mucinex twice daily with a full glass of water.  She is also going to come to or later this week to repeat her chest x-ray as previously advised.  2. Current smoker - nicotine (NICODERM CQ) 21 mg/24hr patch; Place 1 patch (21 mg total) onto the skin daily.  Dispense: 28 patch; Refill: 1   Follow Up Instructions:  I discussed the assessment and treatment plan with the patient. The patient was provided an opportunity to ask questions and all were answered. The patient agreed with the plan and demonstrated an understanding of the instructions.   The patient was advised to call back or seek an in-person evaluation if the symptoms worsen or if the condition fails to improve as anticipated.  The above assessment and management plan was discussed with the patient. The patient verbalized understanding of and has agreed to the management plan. Patient is aware to call the clinic if symptoms persist or worsen. Patient is aware when to return to the clinic for a follow-up visit. Patient educated on  when it is appropriate to go to the emergency department.   Time call ended: 9:55 AM  I provided 32 minutes of non-face-to-face time during this encounter.  Hendricks Limes, MSN, APRN, FNP-C [facility] 12/19/19

## 2019-12-20 ENCOUNTER — Ambulatory Visit (INDEPENDENT_AMBULATORY_CARE_PROVIDER_SITE_OTHER): Payer: Medicaid Other

## 2019-12-20 ENCOUNTER — Telehealth: Payer: Self-pay | Admitting: Family

## 2019-12-20 DIAGNOSIS — J449 Chronic obstructive pulmonary disease, unspecified: Secondary | ICD-10-CM | POA: Diagnosis not present

## 2019-12-20 DIAGNOSIS — F17200 Nicotine dependence, unspecified, uncomplicated: Secondary | ICD-10-CM | POA: Diagnosis not present

## 2019-12-20 DIAGNOSIS — Z09 Encounter for follow-up examination after completed treatment for conditions other than malignant neoplasm: Secondary | ICD-10-CM | POA: Diagnosis not present

## 2019-12-20 DIAGNOSIS — J189 Pneumonia, unspecified organism: Secondary | ICD-10-CM

## 2019-12-20 NOTE — Telephone Encounter (Signed)
Requesting refill

## 2019-12-20 NOTE — Telephone Encounter (Signed)
°  Prescription Request  12/20/2019  What is the name of the medication or equipment? lurasidone (LATUDA) 40 MG TABS tablet  Have you contacted your pharmacy to request a refill? (if applicable) no new rx. Pt last ov 12/14/2019 and 12/19/2019  Which pharmacy would you like this sent to? [facility]   Patient notified that their request is being sent to the clinical staff for review and that they should receive a response within 2 business days.

## 2019-12-21 ENCOUNTER — Telehealth: Payer: Self-pay | Admitting: Family

## 2019-12-21 ENCOUNTER — Ambulatory Visit (HOSPITAL_COMMUNITY): Payer: Medicaid Other

## 2019-12-21 ENCOUNTER — Other Ambulatory Visit: Payer: Self-pay | Admitting: Orthopaedic Surgery

## 2019-12-21 MED ORDER — LURASIDONE HCL 40 MG PO TABS: 40.0000 mg | ORAL_TABLET | Freq: Every day | ORAL | 0 refills | Status: DC

## 2019-12-21 NOTE — Telephone Encounter (Signed)
Prescription sent to pharmacy.

## 2019-12-21 NOTE — Telephone Encounter (Signed)
Appt scheduled

## 2019-12-21 NOTE — Telephone Encounter (Signed)
Pt will need surgical clear ence appt.

## 2019-12-25 ENCOUNTER — Ambulatory Visit: Payer: Medicaid Other | Admitting: Family

## 2019-12-27 ENCOUNTER — Ambulatory Visit: Payer: Medicaid Other | Admitting: Family Medicine

## 2019-12-29 ENCOUNTER — Encounter: Payer: Self-pay | Admitting: Family

## 2019-12-29 ENCOUNTER — Ambulatory Visit (INDEPENDENT_AMBULATORY_CARE_PROVIDER_SITE_OTHER): Payer: Medicaid Other

## 2019-12-29 ENCOUNTER — Ambulatory Visit: Payer: Medicaid Other | Admitting: Family

## 2019-12-29 ENCOUNTER — Other Ambulatory Visit: Payer: Self-pay

## 2019-12-29 VITALS — BP 93/65 | HR 61 | Temp 97.7°F | Ht 66.0 in | Wt 201.4 lb

## 2019-12-29 DIAGNOSIS — J449 Chronic obstructive pulmonary disease, unspecified: Secondary | ICD-10-CM

## 2019-12-29 DIAGNOSIS — Z01818 Encounter for other preprocedural examination: Secondary | ICD-10-CM

## 2019-12-29 DIAGNOSIS — F17200 Nicotine dependence, unspecified, uncomplicated: Secondary | ICD-10-CM | POA: Diagnosis not present

## 2019-12-29 DIAGNOSIS — E039 Hypothyroidism, unspecified: Secondary | ICD-10-CM

## 2019-12-29 DIAGNOSIS — E669 Obesity, unspecified: Secondary | ICD-10-CM | POA: Diagnosis not present

## 2019-12-29 DIAGNOSIS — Z8701 Personal history of pneumonia (recurrent): Secondary | ICD-10-CM

## 2019-12-29 NOTE — Patient Instructions (Signed)

## 2019-12-29 NOTE — Pre-Procedure Instructions (Signed)
Rachael Watson  12/29/2019      [facility] [address] [place] [address] Phone: [phone] Fax: [place], [place] [address] [place] [address] Phone: [phone] Fax: [phone]    Your procedure is scheduled on 01/23/20.  Report to [facility] Admitting at 530 A.M.  Call this number if you have problems the morning of surgery:  [phone]   Remember:  Do not eat or drink after midnight.  You may drink clear liquids until 430AM .  Clear liquids allowed are:                    Juice (non-citric and without pulp - diabetics please choose diet or no sugar options), Carbonated beverages - (diabetics please choose diet or no sugar options), Clear Tea, Black Coffee only (no creamer, milk or cream including half and half), Plain Jell-O only (diabetics please choose diet or no sugar options) and Gatorade (diabetics please choose diet or no sugar options)    Take these medicines the morning of surgery with A SIP OF WATER -  ALL INHALERS  ZYRTEC  CELEXA  NEURONTIN  ISOSORBIDE  SYNTHROID  PROTONIX  NICODERM  PATCH  ATIVAN      .  Do not take any aspirin,anti-inflammatories,vitamins,or herbal supplements 5-7 days prior to surgery.Please complete your PRE-SURGERY ENSURE that was provided to you by ... the morning of surgery.  Please, if able, drink it in one setting. DO NOT SIP.      Do not wear jewelry, make-up or nail polish.  Do not wear lotions, powders, or perfumes, or deodorant.  Do not shave 48 hours prior to surgery.  Men may shave face and neck.  Do not bring valuables to the hospital.  [facility] is not responsible for any belongings or valuables.  Contacts, dentures or bridgework may not be worn into surgery.  Leave your suitcase in the car.  After surgery it may be brought to your room.  For patients admitted to the hospital, discharge time will be  determined by your treatment team.  Patients discharged the day of surgery will not be allowed to drive home.    Special instructions:  [facility] - Preparing for Surgery  Before surgery, you can play an important role.  Because skin is not sterile, your skin needs to be as free of germs as possible.  You can reduce the number of germs on you skin by washing with CHG (chlorahexidine gluconate) soap before surgery.  CHG is an antiseptic cleaner which kills germs and bonds with the skin to continue killing germs even after washing.  Oral Hygiene is also important in reducing the risk of infection.  Remember to brush your teeth with your regular toothpaste the morning of surgery.  Please DO NOT use if you have an allergy to CHG or antibacterial soaps.  If your skin becomes reddened/irritated stop using the CHG and inform your nurse when you arrive at Short Stay.  Do not shave (including legs and underarms) for at least 48 hours prior to the first CHG shower.  You may shave your face.  Please follow these instructions carefully:   1.  Shower with CHG Soap the night before surgery and the morning of Surgery.  2.  If you choose to wash your hair, wash your hair first as usual  with your normal shampoo.  3.  After you shampoo, rinse your hair and body thoroughly to remove the shampoo. 4.  Use CHG as you would any other liquid soap.  You can apply chg directly to the skin and wash gently with a      scrungie or washcloth.           5.  Apply the CHG Soap to your body ONLY FROM THE NECK DOWN.   Do not use on open wounds or open sores. Avoid contact with your eyes, ears, mouth and genitals (private parts).  Wash genitals (private parts) with your normal soap.  6.  Wash thoroughly, paying special attention to the area where your surgery will be performed.  7.  Thoroughly rinse your body with warm water from the neck down.  8.  DO NOT shower/wash with your normal soap after using and rinsing off the CHG  Soap.  9.  Pat yourself dry with a clean towel.            10.  Wear clean pajamas.            11.  Place clean sheets on your bed the night of your first shower and do not sleep with pets.  Day of Surgery  Do not apply any lotions/deoderants the morning of surgery.   Please wear clean clothes to the hospital/surgery center. Remember to brush your teeth with toothpaste.    Please read over the following fact sheets that you were given.    How to Manage Your Diabetes Before and After Surgery  Why is it important to control my blood sugar before and after surgery? . Improving blood sugar levels before and after surgery helps healing and can limit problems. . A way of improving blood sugar control is eating a healthy diet by: o  Eating less sugar and carbohydrates o  Increasing activity/exercise o  Talking with your doctor about reaching your blood sugar goals . High blood sugars (greater than 180 mg/dL) can raise your risk of infections and slow your recovery, so you will need to focus on controlling your diabetes during the weeks before surgery. . Make sure that the doctor who takes care of your diabetes knows about your planned surgery including the date and location.  How do I manage my blood sugar before surgery? . Check your blood sugar at least 4 times a day, starting 2 days before surgery, to make sure that the level is not too high or low. o Check your blood sugar the morning of your surgery when you wake up and every 2 hours until you get to the Short Stay unit. . If your blood sugar is less than 70 mg/dL, you will need to treat for low blood sugar: o Do not take insulin. o Treat a low blood sugar (less than 70 mg/dL) with  cup of clear juice (cranberry or apple), 4 glucose tablets, OR glucose gel. Recheck blood sugar in 15 minutes after treatment (to make sure it is greater than 70 mg/dL). If your blood sugar is not greater than 70 mg/dL on recheck, call  o  for further  instructions. . Report your blood sugar to the short stay nurse when you get to Short Stay.  . If you are admitted to the hospital after surgery: o Your blood sugar will be checked by the staff and you will probably be given insulin after surgery (instead of oral diabetes medicines) to make sure you have good  blood sugar levels. o The goal for blood sugar control after surgery is 80-180 mg/dL.              WHAT DO I DO ABOUT MY DIABETES MEDICATION?   Marland Kitchen Do not take oral diabetes medicines (pills) the morning of surgery.  .      .   . The day of surgery, do not take other diabetes injectables, including Byetta (exenatide), Bydureon (exenatide ER), Victoza (liraglutide), or Trulicity (dulaglutide).  . If your CBG is greater than 220 mg/dL, you may take  of your sliding scale (correction) dose of insulin.  Other Instructions:          Patient Signature:  Date:   Nurse Signature:  Date:   Reviewed and Endorsed by [facility] Patient Education Committee, August 2015

## 2019-12-29 NOTE — Progress Notes (Signed)
Subjective:    Patient ID: Rachael Watson, female    DOB: 09/06/1965, 54 y.o.   MRN: [id number]  Chief Complaint  Patient presents with  . Surgical clearance   Pt presents to the office today for surgical clearance for right heel surgery on 01/23/20. She reports she has intermittent aching pain of 2 out 10.   She had CAP and completed her antibiotics 12/06/19. We repeated her x-ray on 12/20/19 and it had resolved. She is a smoker and smokes about a 1/2 pack a day.  Thyroid Problem Presents for follow-up visit. Symptoms include constipation, dry skin and fatigue. The symptoms have been stable.  Nicotine Dependence Presents for follow-up visit. Symptoms include fatigue. Her urge triggers include company of smokers. She smokes < 1/2 a pack of cigarettes per day. Compliance with prior treatments has been good.      Review of Systems  Constitutional: Positive for fatigue.  Gastrointestinal: Positive for constipation.  All other systems reviewed and are negative.      Objective:   Physical Exam Vitals reviewed.  Constitutional:      General: She is not in acute distress.    Appearance: She is well-developed.  HENT:     Head: Normocephalic and atraumatic.     Right Ear: Tympanic membrane normal.     Left Ear: Tympanic membrane normal.  Eyes:     Pupils: Pupils are equal, round, and reactive to light.  Neck:     Thyroid: No thyromegaly.  Cardiovascular:     Rate and Rhythm: Normal rate and regular rhythm.     Heart sounds: Normal heart sounds. No murmur heard.   Pulmonary:     Effort: Pulmonary effort is normal. No respiratory distress.     Breath sounds: Normal breath sounds. No wheezing.  Abdominal:     General: Bowel sounds are normal. There is no distension.     Palpations: Abdomen is soft.     Tenderness: There is no abdominal tenderness.  Musculoskeletal:        General: Tenderness present.     Cervical back: Normal range of motion and neck supple.     Comments:  Pain in right heel with flexion  Skin:    General: Skin is warm and dry.  Neurological:     Mental Status: She is alert and oriented to person, place, and time.     Cranial Nerves: No cranial nerve deficit.     Deep Tendon Reflexes: Reflexes are normal and symmetric.  Psychiatric:        Behavior: Behavior normal.        Thought Content: Thought content normal.        Judgment: Judgment normal.     Ekg-Normal sinus rhythm   BP 93/65   Pulse 61   Temp 97.7 F (36.5 C) (Temporal)   Ht _0  (1.676 m)   Wt 201 lb 6.4 oz (91.4 kg)   SpO2 98%   BMI 32.51 kg/m      Assessment & Plan:  Rachael Watson comes in today with chief complaint of Surgical clearance   Diagnosis and orders addressed:  1. History of recent pneumonia - EKG 12-Lead - CBC with Differential/Platelet - BMP8+EGFR - TSH - DG Chest 2 View; Future  2. Pre-op exam - CBC with Differential/Platelet - BMP8+EGFR - TSH - DG Chest 2 View; Future  3. Current smoker - CBC with Differential/Platelet - BMP8+EGFR - TSH - DG Chest 2 View; Future  4. Hypothyroidism, unspecified  type - CBC with Differential/Platelet - BMP8+EGFR - TSH  5. Obesity (BMI 30-39.9) - CBC with Differential/Platelet - BMP8+EGFR - TSH  6. Chronic obstructive pulmonary disease, unspecified COPD type ([place]) - DG Chest 2 View; Future   Labs pending Health Maintenance reviewed Diet and exercise encouraged     Evelina Dun, FNP

## 2019-12-30 LAB — CBC WITH DIFFERENTIAL/PLATELET
Basophils Absolute: 0.1 10*3/uL (ref 0.0–0.2)
Basos: 1 %
EOS (ABSOLUTE): 0.2 10*3/uL (ref 0.0–0.4)
Eos: 2 %
Hematocrit: 40.7 % (ref 34.0–46.6)
Hemoglobin: 13.7 g/dL (ref 11.1–15.9)
Immature Grans (Abs): 0.3 10*3/uL — ABNORMAL HIGH (ref 0.0–0.1)
Immature Granulocytes: 2 %
Lymphocytes Absolute: 4.2 10*3/uL — ABNORMAL HIGH (ref 0.7–3.1)
Lymphs: 35 %
MCH: 30 pg (ref 26.6–33.0)
MCHC: 33.7 g/dL (ref 31.5–35.7)
MCV: 89 fL (ref 79–97)
Monocytes Absolute: 0.7 10*3/uL (ref 0.1–0.9)
Monocytes: 6 %
Neutrophils Absolute: 6.5 10*3/uL (ref 1.4–7.0)
Neutrophils: 54 %
Platelets: 378 10*3/uL (ref 150–450)
RBC: 4.57 x10E6/uL (ref 3.77–5.28)
RDW: 13.1 % (ref 11.7–15.4)
WBC: 12 10*3/uL — ABNORMAL HIGH (ref 3.4–10.8)

## 2019-12-30 LAB — TSH: TSH: 3.35 u[IU]/mL (ref 0.450–4.500)

## 2019-12-30 LAB — BMP8+EGFR
BUN/Creatinine Ratio: 16 (ref 9–23)
BUN: 14 mg/dL (ref 6–24)
CO2: 24 mmol/L (ref 20–29)
Calcium: 9.6 mg/dL (ref 8.7–10.2)
Chloride: 100 mmol/L (ref 96–106)
Creatinine, Ser: 0.9 mg/dL (ref 0.57–1.00)
GFR calc Af Amer: 84 mL/min/{1.73_m2} (ref >59)
GFR calc non Af Amer: 73 mL/min/{1.73_m2} (ref >59)
Glucose: 82 mg/dL (ref 65–99)
Potassium: 4.1 mmol/L (ref 3.5–5.2)
Sodium: 137 mmol/L (ref 134–144)

## 2020-01-01 ENCOUNTER — Other Ambulatory Visit: Payer: Self-pay | Admitting: Family

## 2020-01-01 ENCOUNTER — Inpatient Hospital Stay (HOSPITAL_COMMUNITY)
Admission: RE | Admit: 2020-01-01 | Discharge: 2020-01-01 | Disposition: A | Discharge status: Home / Self Care | Payer: Medicaid Other | Source: Ambulatory Visit

## 2020-01-01 ENCOUNTER — Other Ambulatory Visit (HOSPITAL_COMMUNITY): Payer: Medicaid Other

## 2020-01-10 ENCOUNTER — Other Ambulatory Visit: Payer: Self-pay | Admitting: Family

## 2020-01-12 ENCOUNTER — Telehealth: Payer: Self-pay | Admitting: Family

## 2020-01-12 ENCOUNTER — Institutional Professional Consult (permissible substitution): Payer: Self-pay | Admitting: Pulmonary Disease

## 2020-01-12 MED ORDER — RESTASIS 0.05 % OP EMUL: 1.0000 [drp] | Freq: Two times a day (BID) | OPHTHALMIC | 2 refills | Status: DC

## 2020-01-12 NOTE — Telephone Encounter (Signed)
Prescription sent to pharmacy.

## 2020-01-12 NOTE — Telephone Encounter (Signed)
  Prescription Request  01/12/2020  What is the name of the medication or equipment? RESTASIS 0.05 % ophthalmic emulsion  Have you contacted your pharmacy to request a refill? (if applicable) no  Which pharmacy would you like this sent to? [facility]   Patient notified that their request is being sent to the clinical staff for review and that they should receive a response within 2 business days.

## 2020-01-12 NOTE — Telephone Encounter (Signed)
Patient aware, per message left on voice mail,  script is ready.

## 2020-01-12 NOTE — Telephone Encounter (Signed)
Requesting refill.   Last filled 05-05-19.

## 2020-01-18 NOTE — Pre-Procedure Instructions (Signed)
[place] [address], [place] [address] Phone: [phone] Fax: [place], [place] [address] [place] [address] Phone: [phone] Fax: [phone]      Your procedure is scheduled on Tuesday July 20th.  Report to [facility] Main Entrance "A" at 5:30 A.M., and check in at the Admitting office.  Call this number if you have problems the morning of surgery:  [phone]  Call [phone] if you have any questions prior to your surgery date Monday-Friday 8am-4pm    Remember:  Do not eat after midnight the night before your surgery  You may drink clear liquids until 4:30 the morning of your surgery.   Clear liquids allowed are: Water, Non-Citrus Juices (without pulp), Carbonated Beverages, Clear Tea, Black Coffee Only, and Gatorade  The day of surgery (if you have diabetes):  o Drink ONE (1) Gatorade 2 (G2) as directed. o This drink was given to you during your hospital  pre-op appointment visit.  o The pre-op nurse will instruct you on the time to drink the   Gatorade 2 (G2) depending on your surgery time. o Color of the Gatorade may vary. Red is not allowed. o Nothing else to drink after completing the  Gatorade 2 (G2).         If you have questions, please contact your surgeons office.   Take these medicines the morning of surgery with A SIP OF WATER  cetirizine (ZYRTEC) citalopram (CELEXA)  cycloSPORINE (RESTASIS) doxycycline (VIBRA-TABS) gabapentin (NEURONTIN) hydrOXYzine (ATARAX/VISTARIL) isosorbide mononitrate (IMDUR) lurasidone (LATUDA) levothyroxine (SYNTHROID) pantoprazole (PROTONIX) cyclobenzaprine (FLEXERIL) if needed hydrOXYzine (ATARAX/VISTARIL) if needed meclizine (ANTIVERT) if needed  Follow your surgeon's instructions on when to stop Asprin- hold 5-7 prior to surgery.   As of today, STOP taking any Aspirin (unless otherwise instructed by  your surgeon) Aleve, Naproxen, Ibuprofen, Motrin, Advil, Goody's, BC's, all herbal medications, fish oil, and all vitamins.   HOW TO MANAGE YOUR DIABETES BEFORE AND AFTER SURGERY  Why is it important to control my blood sugar before and after surgery?  Improving blood sugar levels before and after surgery helps healing and can limit problems.  A way of improving blood sugar control is eating a healthy diet by: o  Eating less sugar and carbohydrates o  Increasing activity/exercise o  Talking with your doctor about reaching your blood sugar goals  High blood sugars (greater than 180 mg/dL) can raise your risk of infections and slow your recovery, so you will need to focus on controlling your diabetes during the weeks before surgery.  Make sure that the doctor who takes care of your diabetes knows about your planned surgery including the date and location.  How do I manage my blood sugar before surgery?  Check your blood sugar at least 4 times a day, starting 2 days before surgery, to make sure that the level is not too high or low. o Check your blood sugar the morning of your surgery when you wake up and every 2 hours until you get to the Short Stay unit.  If your blood sugar is less than 70 mg/dL, you will need to treat for low blood sugar: o Do not take insulin. o Treat a low blood sugar (less than 70 mg/dL) with  cup of clear juice (cranberry or apple), 4 glucose tablets, OR glucose gel. Recheck blood sugar in 15 minutes after treatment (  to make sure it is greater than 70 mg/dL). If your blood sugar is not greater than 70 mg/dL on recheck, call [phone] o  for further instructions.  Report your blood sugar to the short stay nurse when you get to Short Stay.   If you are admitted to the hospital after surgery: o Your blood sugar will be checked by the staff and you will probably be given insulin after surgery (instead of oral diabetes medicines) to make sure you have good blood  sugar levels. o The goal for blood sugar control after surgery is 80-180 mg/dL.                      Do not wear jewelry, make up, or nail polish            Do not wear lotions, powders, perfumes/colognes, or deodorant.            Do not shave 48 hours prior to surgery.  Men may shave face and neck.            Do not bring valuables to the hospital.            [facility] is not responsible for any belongings or valuables.  Do NOT Smoke (Tobacco/Vaping) or drink Alcohol 24 hours prior to your procedure If you use a CPAP at night, you may bring all equipment for your overnight stay.   Contacts, glasses, dentures or bridgework may not be worn into surgery.      For patients admitted to the hospital, discharge time will be determined by your treatment team.   Patients discharged the day of surgery will not be allowed to drive home, and someone needs to stay with them for 24 hours.    Special instructions:   [place]- Preparing For Surgery  Before surgery, you can play an important role. Because skin is not sterile, your skin needs to be as free of germs as possible. You can reduce the number of germs on your skin by washing with CHG (chlorahexidine gluconate) Soap before surgery.  CHG is an antiseptic cleaner which kills germs and bonds with the skin to continue killing germs even after washing.    Oral Hygiene is also important to reduce your risk of infection.  Remember - BRUSH YOUR TEETH THE MORNING OF SURGERY WITH YOUR REGULAR TOOTHPASTE  Please do not use if you have an allergy to CHG or antibacterial soaps. If your skin becomes reddened/irritated stop using the CHG.  Do not shave (including legs and underarms) for at least 48 hours prior to first CHG shower. It is OK to shave your face.  Please follow these instructions carefully.   1. Shower the NIGHT BEFORE SURGERY and the MORNING OF SURGERY with CHG Soap.   2. If you chose to wash your hair, wash your hair first as usual  with your normal shampoo.  3. After you shampoo, rinse your hair and body thoroughly to remove the shampoo.  4. Use CHG as you would any other liquid soap. You can apply CHG directly to the skin and wash gently with a scrungie or a clean washcloth.   5. Apply the CHG Soap to your body ONLY FROM THE NECK DOWN.  Do not use on open wounds or open sores. Avoid contact with your eyes, ears, mouth and genitals (private parts). Wash Face and genitals (private parts)  with your normal soap.   6. Wash thoroughly, paying special attention to  the area where your surgery will be performed.  7. Thoroughly rinse your body with warm water from the neck down.  8. DO NOT shower/wash with your normal soap after using and rinsing off the CHG Soap.  9. Pat yourself dry with a CLEAN TOWEL.  10. Wear CLEAN PAJAMAS to bed the night before surgery  11. Place CLEAN SHEETS on your bed the night of your first shower and DO NOT SLEEP WITH PETS.   Day of Surgery: Wear Clean/Comfortable clothing the morning of surgery Do not apply any deodorants/lotions.   Remember to brush your teeth WITH YOUR REGULAR TOOTHPASTE.   Please read over the following fact sheets that you were given.
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
## 2020-01-19 ENCOUNTER — Other Ambulatory Visit (HOSPITAL_COMMUNITY)
Admission: RE | Admit: 2020-01-19 | Discharge: 2020-01-19 | Disposition: A | Discharge status: Home / Self Care | Payer: Medicaid Other | Source: Ambulatory Visit | Attending: Orthopaedic Surgery | Admitting: Orthopaedic Surgery

## 2020-01-19 ENCOUNTER — Encounter (HOSPITAL_COMMUNITY): Payer: Self-pay

## 2020-01-19 ENCOUNTER — Other Ambulatory Visit: Payer: Self-pay

## 2020-01-19 ENCOUNTER — Encounter (HOSPITAL_COMMUNITY)
Admission: RE | Admit: 2020-01-19 | Discharge: 2020-01-19 | Disposition: A | Discharge status: Home / Self Care | Payer: Medicaid Other | Source: Ambulatory Visit | Attending: Orthopaedic Surgery | Admitting: Orthopaedic Surgery

## 2020-01-19 DIAGNOSIS — Z01812 Encounter for preprocedural laboratory examination: Secondary | ICD-10-CM | POA: Diagnosis not present

## 2020-01-19 DIAGNOSIS — Z20822 Contact with and (suspected) exposure to covid-19: Secondary | ICD-10-CM | POA: Insufficient documentation

## 2020-01-19 HISTORY — DX: Unspecified osteoarthritis, unspecified site: M19.90

## 2020-01-19 HISTORY — DX: Pneumonia, unspecified organism: J18.9

## 2020-01-19 HISTORY — DX: Chronic obstructive pulmonary disease, unspecified: J44.9

## 2020-01-19 HISTORY — DX: Unspecified asthma, uncomplicated: J45.909

## 2020-01-19 HISTORY — DX: Cardiac murmur, unspecified: R01.1

## 2020-01-19 HISTORY — DX: Fatty (change of) liver, not elsewhere classified: K76.0

## 2020-01-19 LAB — CBC
HCT: 40.1 % (ref 36.0–46.0)
Hemoglobin: 12.7 g/dL (ref 12.0–15.0)
MCH: 28.6 pg (ref 26.0–34.0)
MCHC: 31.7 g/dL (ref 30.0–36.0)
MCV: 90.3 fL (ref 80.0–100.0)
Platelets: 259 10*3/uL (ref 150–400)
RBC: 4.44 MIL/uL (ref 3.87–5.11)
RDW: 13.5 % (ref 11.5–15.5)
WBC: 10.7 10*3/uL — ABNORMAL HIGH (ref 4.0–10.5)
nRBC: 0 % (ref 0.0–0.2)

## 2020-01-19 LAB — HEMOGLOBIN A1C
Hgb A1c MFr Bld: 5.7 % — ABNORMAL HIGH (ref 4.8–5.6)
Mean Plasma Glucose: 116.89 mg/dL

## 2020-01-19 LAB — COMPREHENSIVE METABOLIC PANEL
ALT: 26 U/L (ref 0–44)
AST: 24 U/L (ref 15–41)
Albumin: 3.9 g/dL (ref 3.5–5.0)
Alkaline Phosphatase: 77 U/L (ref 38–126)
Anion gap: 8 (ref 5–15)
BUN: 14 mg/dL (ref 6–20)
CO2: 23 mmol/L (ref 22–32)
Calcium: 9.1 mg/dL (ref 8.9–10.3)
Chloride: 107 mmol/L (ref 98–111)
Creatinine, Ser: 0.88 mg/dL (ref 0.44–1.00)
GFR calc Af Amer: >60 mL/min (ref >60)
GFR calc non Af Amer: >60 mL/min (ref >60)
Glucose, Bld: 83 mg/dL (ref 70–99)
Potassium: 4.5 mmol/L (ref 3.5–5.1)
Sodium: 138 mmol/L (ref 135–145)
Total Bilirubin: 0.9 mg/dL (ref 0.3–1.2)
Total Protein: 7.1 g/dL (ref 6.5–8.1)

## 2020-01-19 LAB — SURGICAL PCR SCREEN
MRSA, PCR: NEGATIVE
Staphylococcus aureus: NEGATIVE

## 2020-01-19 LAB — GLUCOSE, CAPILLARY: Glucose-Capillary: 84 mg/dL (ref 70–99)

## 2020-01-19 LAB — SARS CORONAVIRUS 2 (TAT 6-24 HRS): SARS Coronavirus 2: NEGATIVE

## 2020-01-19 NOTE — Pre-Procedure Instructions (Signed)
[facility] [address] [place] [address] Phone: [phone] Fax: [place], [place] [address] [place] [address] Phone: [phone] Fax: [phone]      Your procedure is scheduled on Tuesday July 20th.  Report to [facility] Main Entrance "A" at 5:30 A.M., and check in at the Admitting office.  Call this number if you have problems the morning of surgery:  [phone]  Call [phone] if you have any questions prior to your surgery date Monday-Friday 8am-4pm    Remember:  Do not eat after midnight the night before your surgery  You may drink clear liquids until 4:30 the morning of your surgery.   Clear liquids allowed are: Water, Non-Citrus Juices (without pulp), Carbonated Beverages, Clear Tea, Black Coffee Only, and Gatorade  The day of surgery (if you have diabetes):  o Drink ONE (1) Gatorade 2 (G2) as directed. o This drink was given to you during your hospital  pre-op appointment visit.  o The pre-op nurse will instruct you on the time to drink the   Gatorade 2 (G2) depending on your surgery time. - complete by 04:30am the morning of surgery o Color of the Gatorade may vary. Red is not allowed. o Nothing else to drink after completing the  Gatorade 2 (G2).         If you have questions, please contact your surgeon's office.   Take these medicines the morning of surgery with A SIP OF WATER  Atorvastatin (Lipitor) cetirizine (ZYRTEC) cycloSPORINE (RESTASIS) doxycycline (VIBRA-TABS) Duloxetine (Cymbalta) gabapentin (NEURONTIN) isosorbide mononitrate (IMDUR) Levothyroxine (Synthroid) lurasidone (LATUDA) levothyroxine (SYNTHROID)     meclizine (ANTIVERT) pantoprazole (PROTONIX)      carboxymethlcellulose (Refresh Plus) eye drops - if needed cyclobenzaprine (FLEXERIL) if needed hydrOXYzine (ATARAX/VISTARIL) if needed Lorazepam (Ativan) - if needed      Oxycodone-acetaminophen (Percocet)     Proair inhaler - if needed (Please bring all inhalers with you the day of surgery)   Follow your surgeon's instructions on when to stop Asprin- hold 5-7 prior to surgery.   As of today, STOP taking any Aspirin (unless otherwise instructed by your surgeon) Aleve, Naproxen, Ibuprofen, Motrin, Advil, Goody's, BC's, all herbal medications, fish oil, and all vitamins.   HOW TO MANAGE YOUR DIABETES BEFORE AND AFTER SURGERY  Why is it important to control my blood sugar before and after surgery? . Improving blood sugar levels before and after surgery helps healing and can limit problems. . A way of improving blood sugar control is eating a healthy diet by: o  Eating less sugar and carbohydrates o  Increasing activity/exercise o  Talking with your doctor about reaching your blood sugar goals . High blood sugars (greater than 180 mg/dL) can raise your risk of infections and slow your recovery, so you will need to focus on controlling your diabetes during the weeks before surgery. . Make sure that the doctor who takes care of your diabetes knows about your planned surgery including the date and location.  How do I manage my blood sugar before surgery? . Check your blood sugar at least 4 times a day, starting 2 days before surgery, to make sure that the level is not too high or low. o Check your blood sugar the morning of your surgery when you wake up and every 2 hours until you get to the Short Stay unit. . If  your blood sugar is less than 70 mg/dL, you will need to treat for low blood sugar: o Do not take insulin. o Treat a low blood sugar (less than 70 mg/dL) with  cup of clear juice (cranberry or apple), 4 glucose tablets, OR glucose gel. Recheck blood sugar in 15 minutes after treatment (to make sure it is greater than 70 mg/dL). If your blood sugar is not greater than 70 mg/dL on recheck, call [phone] o  for further instructions. . Report your  blood sugar to the short stay nurse when you get to Short Stay.  . If you are admitted to the hospital after surgery: o Your blood sugar will be checked by the staff and you will probably be given insulin after surgery (instead of oral diabetes medicines) to make sure you have good blood sugar levels. o The goal for blood sugar control after surgery is 80-180 mg/dL.                      Do not wear jewelry, make up, or nail polish            Do not wear lotions, powders, perfumes/colognes, or deodorant.            Do not shave 48 hours prior to surgery.  Men may shave face and neck.            Do not bring valuables to the hospital.            [facility] is not responsible for any belongings or valuables.  Do NOT Smoke (Tobacco/Vaping) or drink Alcohol 24 hours prior to your procedure If you use a CPAP at night, you may bring all equipment for your overnight stay.   Contacts, glasses, dentures or bridgework may not be worn into surgery.      For patients admitted to the hospital, discharge time will be determined by your treatment team.   Patients discharged the day of surgery will not be allowed to drive home, and someone needs to stay with them for 24 hours.    Special instructions:   - Preparing For Surgery  Before surgery, you can play an important role. Because skin is not sterile, your skin needs to be as free of germs as possible. You can reduce the number of germs on your skin by washing with CHG (chlorahexidine gluconate) Soap before surgery.  CHG is an antiseptic cleaner which kills germs and bonds with the skin to continue killing germs even after washing.    Oral Hygiene is also important to reduce your risk of infection.  Remember - BRUSH YOUR TEETH THE MORNING OF SURGERY WITH YOUR REGULAR TOOTHPASTE  Please do not use if you have an allergy to CHG or antibacterial soaps. If your skin becomes reddened/irritated stop using the CHG.  Do not shave (including legs  and underarms) for at least 48 hours prior to first CHG shower. It is OK to shave your face.  Please follow these instructions carefully.   1. Shower the NIGHT BEFORE SURGERY and the MORNING OF SURGERY with CHG Soap.   2. If you chose to wash your hair, wash your hair first as usual with your normal shampoo.  3. After you shampoo, rinse your hair and body thoroughly to remove the shampoo.  4. Use CHG as you would any other liquid soap. You can apply CHG directly to the skin and wash gently with a scrungie or a clean washcloth.  5. Apply the CHG Soap to your body ONLY FROM THE NECK DOWN.  Do not use on open wounds or open sores. Avoid contact with your eyes, ears, mouth and genitals (private parts). Wash Face and genitals (private parts)  with your normal soap.   6. Wash thoroughly, paying special attention to the area where your surgery will be performed.  7. Thoroughly rinse your body with warm water from the neck down.  8. DO NOT shower/wash with your normal soap after using and rinsing off the CHG Soap.  9. Pat yourself dry with a CLEAN TOWEL.  10. Wear CLEAN PAJAMAS to bed the night before surgery  11. Place CLEAN SHEETS on your bed the night of your first shower and DO NOT SLEEP WITH PETS.   Day of Surgery: Wear Clean/Comfortable clothing the morning of surgery Do not apply any deodorants/lotions.   Remember to brush your teeth WITH YOUR REGULAR TOOTHPASTE.   Please read over the following fact sheets that you were given.

## 2020-01-19 NOTE — Progress Notes (Addendum)
PCP - [facility] Cardiologist - Dr. Harl Bowie  PPM/ICD - n/a Device Orders -  Rep Notified -   Chest x-ray - 12/30/2019 EKG - 12/29/2019 Stress Test - patient denies ECHO - 10/14/2017 Cardiac Cath - 12/17/2017  Sleep Study - patient denies CPAP -   Fasting Blood Sugar - patient does not check her CBG at home regularly.  Patient stated she only checks it when she "feels off" Patient does not take any medications for DM.  A1c obtained at PAT appointment.  Blood Thinner Instructions: Aspirin Instructions: patient's last dose 01/16/2020  ERAS Protcol - clears until 0430 PRE-SURGERY Ensure or G2- G2 provided, to be completed by 0430 morning of surgery  COVID TEST- after PAT appointment   Anesthesia review: yes, history of cardiac testing, recent pneumonia, new diagnosis of COPD per patient.  Patient denies shortness of breath, fever, cough and chest pain at PAT appointment   All instructions explained to the patient, with a verbal understanding of the material. Patient agrees to go over the instructions while at home for a better understanding. Patient also instructed to self quarantine after being tested for COVID-19. The opportunity to ask questions was provided.
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
## 2020-01-22 NOTE — Anesthesia Preprocedure Evaluation (Addendum)
Anesthesia Evaluation  Patient identified by MRN, date of birth, ID band Patient awake    Reviewed: Allergy & Precautions, NPO status , Patient's Chart, lab work & pertinent test results  History of Anesthesia Complications Negative for: history of anesthetic complications  Airway Mallampati: II  TM Distance: >3 FB Neck ROM: Full    Dental  (+) Missing,    Pulmonary asthma , COPD, Current Smoker,    Pulmonary exam normal        Cardiovascular +CHF  Normal cardiovascular exam  Normal cardiac cath 2019  Echo 10/14/17: mild LVH, EF 60-65%, mild AR, mild MR    Neuro/Psych Anxiety Depression negative neurological ROS     GI/Hepatic Neg liver ROS, GERD  Medicated and Controlled,  Endo/Other  diabetes, Type 2Hypothyroidism   Renal/GU negative Renal ROS  negative genitourinary   Musculoskeletal negative musculoskeletal ROS (+)   Abdominal   Peds  Hematology negative hematology ROS (+)   Anesthesia Other Findings Day of surgery medications reviewed with patient.  Reproductive/Obstetrics negative OB ROS                            Anesthesia Physical Anesthesia Plan  ASA: II  Anesthesia Plan: General   Post-op Pain Management: GA combined w/ Regional for post-op pain   Induction: Intravenous  PONV Risk Score and Plan: 2 and Treatment may vary due to age or medical condition, Ondansetron, Dexamethasone and Midazolam  Airway Management Planned: Oral ETT  Additional Equipment: None  Intra-op Plan:   Post-operative Plan: Extubation in OR  Informed Consent: I have reviewed the patients History and Physical, chart, labs and discussed the procedure including the risks, benefits and alternatives for the proposed anesthesia with the patient or authorized representative who has indicated his/her understanding and acceptance.     Dental advisory given  Plan Discussed with: CRNA  Anesthesia  Plan Comments: (PAT note written 01/22/2020 by Myra Gianotti, PA-C. )      Anesthesia Quick Evaluation

## 2020-01-23 ENCOUNTER — Ambulatory Visit (HOSPITAL_COMMUNITY)
Admission: RE | Admit: 2020-01-23 | Discharge: 2020-01-23 | Disposition: A | Discharge status: Home / Self Care | Payer: Medicaid Other | Attending: Orthopaedic Surgery | Admitting: Orthopaedic Surgery

## 2020-01-23 ENCOUNTER — Encounter (HOSPITAL_COMMUNITY)
Admission: RE | Disposition: A | Discharge status: Home / Self Care | Payer: Self-pay | Source: Home / Self Care | Attending: Orthopaedic Surgery

## 2020-01-23 ENCOUNTER — Encounter (HOSPITAL_COMMUNITY): Payer: Self-pay | Admitting: Orthopaedic Surgery

## 2020-01-23 ENCOUNTER — Ambulatory Visit (HOSPITAL_COMMUNITY): Payer: Medicaid Other

## 2020-01-23 ENCOUNTER — Ambulatory Visit (HOSPITAL_COMMUNITY): Payer: Medicaid Other | Admitting: Vascular Surgery

## 2020-01-23 ENCOUNTER — Ambulatory Visit (HOSPITAL_COMMUNITY): Payer: Medicaid Other | Admitting: Anesthesiology

## 2020-01-23 ENCOUNTER — Other Ambulatory Visit: Payer: Self-pay

## 2020-01-23 DIAGNOSIS — I110 Hypertensive heart disease with heart failure: Secondary | ICD-10-CM | POA: Insufficient documentation

## 2020-01-23 DIAGNOSIS — M19071 Primary osteoarthritis, right ankle and foot: Secondary | ICD-10-CM | POA: Diagnosis not present

## 2020-01-23 DIAGNOSIS — S86311A Strain of muscle(s) and tendon(s) of peroneal muscle group at lower leg level, right leg, initial encounter: Secondary | ICD-10-CM | POA: Diagnosis not present

## 2020-01-23 DIAGNOSIS — E039 Hypothyroidism, unspecified: Secondary | ICD-10-CM | POA: Insufficient documentation

## 2020-01-23 DIAGNOSIS — I509 Heart failure, unspecified: Secondary | ICD-10-CM | POA: Insufficient documentation

## 2020-01-23 DIAGNOSIS — Z7982 Long term (current) use of aspirin: Secondary | ICD-10-CM | POA: Diagnosis not present

## 2020-01-23 DIAGNOSIS — X58XXXD Exposure to other specified factors, subsequent encounter: Secondary | ICD-10-CM | POA: Diagnosis not present

## 2020-01-23 DIAGNOSIS — Z79899 Other long term (current) drug therapy: Secondary | ICD-10-CM | POA: Diagnosis not present

## 2020-01-23 DIAGNOSIS — S92001A Unspecified fracture of right calcaneus, initial encounter for closed fracture: Secondary | ICD-10-CM | POA: Diagnosis present

## 2020-01-23 DIAGNOSIS — M1990 Unspecified osteoarthritis, unspecified site: Secondary | ICD-10-CM | POA: Insufficient documentation

## 2020-01-23 DIAGNOSIS — S96811D Strain of other specified muscles and tendons at ankle and foot level, right foot, subsequent encounter: Secondary | ICD-10-CM | POA: Insufficient documentation

## 2020-01-23 DIAGNOSIS — E119 Type 2 diabetes mellitus without complications: Secondary | ICD-10-CM | POA: Insufficient documentation

## 2020-01-23 DIAGNOSIS — Z808 Family history of malignant neoplasm of other organs or systems: Secondary | ICD-10-CM | POA: Diagnosis not present

## 2020-01-23 DIAGNOSIS — F17210 Nicotine dependence, cigarettes, uncomplicated: Secondary | ICD-10-CM | POA: Insufficient documentation

## 2020-01-23 DIAGNOSIS — R011 Cardiac murmur, unspecified: Secondary | ICD-10-CM | POA: Insufficient documentation

## 2020-01-23 DIAGNOSIS — K760 Fatty (change of) liver, not elsewhere classified: Secondary | ICD-10-CM | POA: Insufficient documentation

## 2020-01-23 DIAGNOSIS — K219 Gastro-esophageal reflux disease without esophagitis: Secondary | ICD-10-CM | POA: Diagnosis not present

## 2020-01-23 DIAGNOSIS — F329 Major depressive disorder, single episode, unspecified: Secondary | ICD-10-CM | POA: Insufficient documentation

## 2020-01-23 DIAGNOSIS — D1621 Benign neoplasm of long bones of right lower limb: Secondary | ICD-10-CM | POA: Diagnosis not present

## 2020-01-23 DIAGNOSIS — S92001P Unspecified fracture of right calcaneus, subsequent encounter for fracture with malunion: Secondary | ICD-10-CM | POA: Insufficient documentation

## 2020-01-23 DIAGNOSIS — J449 Chronic obstructive pulmonary disease, unspecified: Secondary | ICD-10-CM | POA: Insufficient documentation

## 2020-01-23 DIAGNOSIS — Z8249 Family history of ischemic heart disease and other diseases of the circulatory system: Secondary | ICD-10-CM | POA: Insufficient documentation

## 2020-01-23 DIAGNOSIS — Z810 Family history of intellectual disabilities: Secondary | ICD-10-CM | POA: Insufficient documentation

## 2020-01-23 DIAGNOSIS — Z419 Encounter for procedure for purposes other than remedying health state, unspecified: Secondary | ICD-10-CM

## 2020-01-23 DIAGNOSIS — Z888 Allergy status to other drugs, medicaments and biological substances status: Secondary | ICD-10-CM | POA: Insufficient documentation

## 2020-01-23 DIAGNOSIS — Z818 Family history of other mental and behavioral disorders: Secondary | ICD-10-CM | POA: Insufficient documentation

## 2020-01-23 DIAGNOSIS — F419 Anxiety disorder, unspecified: Secondary | ICD-10-CM | POA: Insufficient documentation

## 2020-01-23 DIAGNOSIS — F17200 Nicotine dependence, unspecified, uncomplicated: Secondary | ICD-10-CM | POA: Insufficient documentation

## 2020-01-23 DIAGNOSIS — G8918 Other acute postprocedural pain: Secondary | ICD-10-CM | POA: Diagnosis not present

## 2020-01-23 HISTORY — PX: OPEN REDUCTION, INTERNAL FIXATION (ORIF) CALCANEAL FRACTURE WITH FUSION: SHX5994

## 2020-01-23 LAB — GLUCOSE, CAPILLARY
Glucose-Capillary: 93 mg/dL (ref 70–99)
Glucose-Capillary: 112 mg/dL — ABNORMAL HIGH (ref 70–99)

## 2020-01-23 SURGERY — OPEN REDUCTION, INTERNAL FIXATION (ORIF) CALCANEAL FRACTURE WITH FUSION
Anesthesia: Regional | Laterality: Right

## 2020-01-23 MED ORDER — ROCURONIUM BROMIDE 10 MG/ML (PF) SYRINGE
PREFILLED_SYRINGE | INTRAVENOUS | Status: DC | PRN
  Administered 2020-01-23: 60 mg via INTRAVENOUS

## 2020-01-23 MED ORDER — EPHEDRINE SULFATE-NACL 50-0.9 MG/10ML-% IV SOSY
PREFILLED_SYRINGE | INTRAVENOUS | Status: DC | PRN
  Administered 2020-01-23 (×3): 10 mg via INTRAVENOUS

## 2020-01-23 MED ORDER — FENTANYL CITRATE (PF) 100 MCG/2ML IJ SOLN
INTRAMUSCULAR | Status: AC
  Filled 2020-01-23: qty 2

## 2020-01-23 MED ORDER — OXYCODONE HCL 5 MG PO TABS: 5.0000 mg | ORAL_TABLET | ORAL | 0 refills | Status: AC | PRN

## 2020-01-23 MED ORDER — PHENYLEPHRINE 40 MCG/ML (10ML) SYRINGE FOR IV PUSH (FOR BLOOD PRESSURE SUPPORT)
PREFILLED_SYRINGE | INTRAVENOUS | Status: DC | PRN
  Administered 2020-01-23: 40 ug via INTRAVENOUS
  Administered 2020-01-23 (×3): 80 ug via INTRAVENOUS

## 2020-01-23 MED ORDER — PROMETHAZINE HCL 25 MG/ML IJ SOLN: 6.2500 mg | INTRAMUSCULAR | Status: DC | PRN

## 2020-01-23 MED ORDER — CHLORHEXIDINE GLUCONATE 0.12 % MT SOLN
15.0000 mL | Freq: Once | OROMUCOSAL | Status: AC
  Administered 2020-01-23: 15 mL via OROMUCOSAL
  Filled 2020-01-23: qty 15

## 2020-01-23 MED ORDER — BUPIVACAINE-EPINEPHRINE (PF) 0.5% -1:200000 IJ SOLN
INTRAMUSCULAR | Status: DC | PRN
  Administered 2020-01-23: 10 mL via PERINEURAL
  Administered 2020-01-23: 20 mL via PERINEURAL

## 2020-01-23 MED ORDER — MIDAZOLAM HCL 2 MG/2ML IJ SOLN
INTRAMUSCULAR | Status: AC
  Filled 2020-01-23: qty 2

## 2020-01-23 MED ORDER — LACTATED RINGERS IV SOLN: INTRAVENOUS | Status: DC

## 2020-01-23 MED ORDER — FENTANYL CITRATE (PF) 100 MCG/2ML IJ SOLN
INTRAMUSCULAR | Status: DC | PRN
  Administered 2020-01-23: 50 ug via INTRAVENOUS
  Administered 2020-01-23: 25 ug via INTRAVENOUS
  Administered 2020-01-23: 50 ug via INTRAVENOUS

## 2020-01-23 MED ORDER — DEXAMETHASONE SODIUM PHOSPHATE 10 MG/ML IJ SOLN
INTRAMUSCULAR | Status: AC
  Filled 2020-01-23: qty 1

## 2020-01-23 MED ORDER — ONDANSETRON HCL 4 MG/2ML IJ SOLN
INTRAMUSCULAR | Status: DC | PRN
  Administered 2020-01-23: 4 mg via INTRAVENOUS

## 2020-01-23 MED ORDER — CLONIDINE HCL (ANALGESIA) 100 MCG/ML EP SOLN
EPIDURAL | Status: DC | PRN
Start: 2020-01-23 — End: 2020-01-23
  Administered 2020-01-23: 67 ug
  Administered 2020-01-23: 33 ug

## 2020-01-23 MED ORDER — 0.9 % SODIUM CHLORIDE (POUR BTL) OPTIME
TOPICAL | Status: DC | PRN
  Administered 2020-01-23: 1000 mL

## 2020-01-23 MED ORDER — PROPOFOL 10 MG/ML IV BOLUS
INTRAVENOUS | Status: DC | PRN
  Administered 2020-01-23: 200 mg via INTRAVENOUS

## 2020-01-23 MED ORDER — VANCOMYCIN HCL IN DEXTROSE 1-5 GM/200ML-% IV SOLN
1000.0000 mg | INTRAVENOUS | Status: AC
  Administered 2020-01-23: 1000 mg via INTRAVENOUS
  Filled 2020-01-23: qty 200

## 2020-01-23 MED ORDER — DEXAMETHASONE SODIUM PHOSPHATE 10 MG/ML IJ SOLN
INTRAMUSCULAR | Status: DC | PRN
Start: 2020-01-23 — End: 2020-01-23
  Administered 2020-01-23: 10 mg via INTRAVENOUS

## 2020-01-23 MED ORDER — MIDAZOLAM HCL 5 MG/5ML IJ SOLN
INTRAMUSCULAR | Status: DC | PRN
  Administered 2020-01-23 (×2): 1 mg via INTRAVENOUS

## 2020-01-23 MED ORDER — ROCURONIUM BROMIDE 10 MG/ML (PF) SYRINGE
PREFILLED_SYRINGE | INTRAVENOUS | Status: AC
  Filled 2020-01-23: qty 10

## 2020-01-23 MED ORDER — FENTANYL CITRATE (PF) 250 MCG/5ML IJ SOLN
INTRAMUSCULAR | Status: AC
  Filled 2020-01-23: qty 5

## 2020-01-23 MED ORDER — LIDOCAINE 2% (20 MG/ML) 5 ML SYRINGE
INTRAMUSCULAR | Status: AC
  Filled 2020-01-23: qty 5

## 2020-01-23 MED ORDER — PROPOFOL 10 MG/ML IV BOLUS
INTRAVENOUS | Status: AC
  Filled 2020-01-23: qty 40

## 2020-01-23 MED ORDER — ONDANSETRON HCL 4 MG/2ML IJ SOLN
INTRAMUSCULAR | Status: AC
  Filled 2020-01-23: qty 2

## 2020-01-23 MED ORDER — SUGAMMADEX SODIUM 200 MG/2ML IV SOLN
INTRAVENOUS | Status: DC | PRN
  Administered 2020-01-23 (×2): 100 mg via INTRAVENOUS

## 2020-01-23 MED ORDER — VANCOMYCIN HCL 500 MG IV SOLR
INTRAVENOUS | Status: AC
  Filled 2020-01-23: qty 500

## 2020-01-23 MED ORDER — LIDOCAINE 2% (20 MG/ML) 5 ML SYRINGE
INTRAMUSCULAR | Status: DC | PRN
  Administered 2020-01-23: 100 mg via INTRAVENOUS

## 2020-01-23 MED ORDER — OXYCODONE HCL 5 MG/5ML PO SOLN: 5.0000 mg | Freq: Once | ORAL | Status: DC | PRN

## 2020-01-23 MED ORDER — ORAL CARE MOUTH RINSE: 15.0000 mL | Freq: Once | OROMUCOSAL | Status: AC

## 2020-01-23 MED ORDER — FENTANYL CITRATE (PF) 100 MCG/2ML IJ SOLN
25.0000 ug | INTRAMUSCULAR | Status: DC | PRN
  Administered 2020-01-23: 50 ug via INTRAVENOUS

## 2020-01-23 MED ORDER — ACETAMINOPHEN 500 MG PO TABS
1000.0000 mg | ORAL_TABLET | Freq: Once | ORAL | Status: AC
  Administered 2020-01-23: 1000 mg via ORAL
  Filled 2020-01-23: qty 2

## 2020-01-23 MED ORDER — OXYCODONE HCL 5 MG PO TABS: 5.0000 mg | ORAL_TABLET | Freq: Once | ORAL | Status: DC | PRN

## 2020-01-23 SURGICAL SUPPLY — 48 items
BIT DRILL 2.5 CANN STRL (BIT) ×3 IMPLANT
BIT DRILL 4 MINI HUDSON CANN (BIT) ×3 IMPLANT
BLADE SURG 15 STRL LF DISP TIS (BLADE) ×2 IMPLANT
BLADE SURG 15 STRL SS (BLADE) ×4
BNDG ELASTIC 6X10 VLCR STRL LF (GAUZE/BANDAGES/DRESSINGS) ×3 IMPLANT
CHLORAPREP W/TINT 26 (MISCELLANEOUS) ×3 IMPLANT
CUFF TOURN SGL QUICK 34 (TOURNIQUET CUFF) ×2
CUFF TRNQT CYL 34X4.125X (TOURNIQUET CUFF) ×1 IMPLANT
DRAPE C-ARM 42X72 X-RAY (DRAPES) ×3 IMPLANT
DRAPE C-ARMOR (DRAPES) ×3 IMPLANT
DRAPE EXTREMITY T 121X128X90 (DISPOSABLE) ×3 IMPLANT
DRAPE IMP U-DRAPE 54X76 (DRAPES) ×3 IMPLANT
DRAPE U-SHAPE 47X51 STRL (DRAPES) ×3 IMPLANT
DRSG PAD ABDOMINAL 8X10 ST (GAUZE/BANDAGES/DRESSINGS) ×3 IMPLANT
DRSG XEROFORM 1X8 (GAUZE/BANDAGES/DRESSINGS) ×3 IMPLANT
ELECT REM PT RETURN 9FT ADLT (ELECTROSURGICAL) ×3
ELECTRODE REM PT RTRN 9FT ADLT (ELECTROSURGICAL) ×1 IMPLANT
GAUZE SPONGE 4X4 12PLY STRL (GAUZE/BANDAGES/DRESSINGS) ×3 IMPLANT
GAUZE SPONGE 4X4 12PLY STRL LF (GAUZE/BANDAGES/DRESSINGS) ×3 IMPLANT
GLOVE BIOGEL M STRL SZ7.5 (GLOVE) ×3 IMPLANT
GLOVE BIOGEL PI IND STRL 8 (GLOVE) ×1 IMPLANT
GLOVE BIOGEL PI INDICATOR 8 (GLOVE) ×2
GOWN STRL REUS W/ TWL LRG LVL3 (GOWN DISPOSABLE) ×1 IMPLANT
GOWN STRL REUS W/ TWL XL LVL3 (GOWN DISPOSABLE) ×1 IMPLANT
GOWN STRL REUS W/TWL LRG LVL3 (GOWN DISPOSABLE) ×2
GOWN STRL REUS W/TWL XL LVL3 (GOWN DISPOSABLE) ×2
GUIDEWIRE W/TROCAR NT 12IN (WIRE) ×6 IMPLANT
KIT BASIN OR (CUSTOM PROCEDURE TRAY) ×3 IMPLANT
NS IRRIG 1000ML POUR BTL (IV SOLUTION) ×3 IMPLANT
PACK ORTHO EXTREMITY (CUSTOM PROCEDURE TRAY) ×3 IMPLANT
PAD CAST 4YDX4 CTTN HI CHSV (CAST SUPPLIES) ×1 IMPLANT
PADDING CAST COTTON 4X4 STRL (CAST SUPPLIES) ×2
PADDING CAST SYNTHETIC 4 (CAST SUPPLIES) ×2
PADDING CAST SYNTHETIC 4X4 STR (CAST SUPPLIES) ×1 IMPLANT
SCREW CANN THRD SD 6.7X85X18 (Screw) ×3 IMPLANT
SCREW CANN TI 18 THRD 6.7X45 (Screw) ×3 IMPLANT
SCREW LP TITA 3.5X46 (Screw) ×3 IMPLANT
SPLINT PLASTER CAST XFAST 5X30 (CAST SUPPLIES) ×1 IMPLANT
SPLINT PLASTER XFAST SET 5X30 (CAST SUPPLIES) ×2
SUCTION FRAZIER HANDLE 10FR (MISCELLANEOUS) ×2
SUCTION TUBE FRAZIER 10FR DISP (MISCELLANEOUS) ×1 IMPLANT
SUT ETHILON 3 0 PS 1 (SUTURE) ×3 IMPLANT
SUT MNCRL AB 3-0 PS2 18 (SUTURE) ×3 IMPLANT
SUT PDS AB 2-0 CT2 27 (SUTURE) ×3 IMPLANT
TOWEL GREEN STERILE FF (TOWEL DISPOSABLE) ×6 IMPLANT
TUBE CONNECTING 20'X1/4 (TUBING) ×2
TUBE CONNECTING 20X1/4 (TUBING) ×4 IMPLANT
UNDERPAD 30X36 HEAVY ABSORB (UNDERPADS AND DIAPERS) ×3 IMPLANT

## 2020-01-23 NOTE — Anesthesia Procedure Notes (Signed)
Anesthesia Regional Block: Popliteal block   Pre-Anesthetic Checklist: ,, timeout performed, Correct Patient, Correct Site, Correct Laterality, Correct Procedure, Correct Position, site marked, Risks and benefits discussed, pre-op evaluation,  At surgeon's request and post-op pain management  Laterality: Right  Prep: Maximum Sterile Barrier Precautions used, chloraprep       Needles:  Injection technique: Single-shot  Needle Type: Echogenic Stimulator Needle     Needle Length: 9cm  Needle Gauge: 22     Additional Needles:   Procedures:,,,, ultrasound used (permanent image in chart),,,,  Narrative:  Start time: 01/23/2020 6:57 AM End time: 01/23/2020 7:00 AM Injection made incrementally with aspirations every 5 mL.  Performed by: Personally  Anesthesiologist: Brennan Bailey, MD  Additional Notes: Risks, benefits, and alternative discussed. Patient gave consent for procedure. Patient prepped and draped in sterile fashion. Sedation administered, patient remains easily responsive to voice. Relevant anatomy identified with ultrasound guidance. Local anesthetic given in 5cc increments with no signs or symptoms of intravascular injection. No pain or paraesthesias with injection. Patient monitored throughout procedure with signs of LAST or immediate complications. Tolerated well. Ultrasound image placed in chart.  Tawny Asal, MD

## 2020-01-23 NOTE — Anesthesia Postprocedure Evaluation (Signed)
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  Last Vitals:  Vitals:   01/23/20 1000 01/23/20 1005  BP:  116/64  Pulse: 88 86  Resp: 19 17  Temp:    SpO2: 97% 95%    Last Pain:  Vitals:   01/23/20 0950  TempSrc:   PainSc: 0-No pain                 Brennan Bailey
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
## 2020-01-23 NOTE — Discharge Instructions (Signed)
DR. Jenal[facility] POST-OP INSTRUCTIONS   Pain Management 1. The numbing medicine and your leg will last around 18 hours, take a dose of your pain medicine as soon as you feel it wearing off to avoid rebound pain. 2. Keep your foot elevated above heart level.  Make sure that your heel hangs free ('floats'). 3. Take all prescribed medication as directed. 4. If taking narcotic pain medication you may want to use an over-the-counter stool softener to avoid constipation. 5. You may take over-the-counter NSAIDs (ibuprofen, naproxen, etc.) as well as over-the-counter acetaminophen as directed on the packaging as a supplement for your pain and may also use it to wean away from the prescription medication.  Activity ? Non-weightbearing ? Keep splint intact  First Postoperative Visit 1. Your first postop visit will be at least 2 weeks after surgery.  This should be scheduled when you schedule surgery. 2. If you do not have a postoperative visit scheduled please call [phone] to schedule an appointment. 3. At the appointment your incision will be evaluated for suture removal, x-rays will be obtained if necessary.  General Instructions 1. Swelling is very common after foot and ankle surgery.  It often takes 3 months for the foot and ankle to begin to feel comfortable.  Some amount of swelling will persist for 6-12 months. 2. DO NOT change the dressing.  If there is a problem with the dressing (too tight, loose, gets wet, etc.) please contact Dr. Kena Limon's office. 3. DO NOT get the dressing wet.  For showers you can use an over-the-counter cast cover or wrap a washcloth around the top of your dressing and then cover it with a plastic bag and tape it to your leg. 4. DO NOT soak the incision (no tubs, pools, bath, etc.) until you have approval from Dr. Jaylina Ramdass.  Contact Dr. Adairs office or go to Emergency Room if: 1. Temperature above 101 F. 2. Increasing pain that is unresponsive to pain  medication or elevation 3. Excessive redness or swelling in your foot 4. Dressing problems - excessive bloody drainage, looseness or tightness, or if dressing gets wet 5. Develop pain, swelling, warmth, or discoloration of your calf  

## 2020-01-23 NOTE — Op Note (Addendum)
Rachael Watson female 54 y.o. 01/23/2020  PreOperative Diagnosis: Right subacute calcaneus fracture Right calcaneal fracture malunion Right subtalar joint arthritis Right peroneal tendinosis Distal fibular exostosis  PostOperative Diagnosis: Right subacute calcaneus fracture Right calcaneal fracture malunion Right subtalar joint arthritis Right peroneus brevis tendon tear Right peroneal tendon subluxation Distal fibular exostosis  PROCEDURE: Open treatment of subacute calcaneus fracture Right subtalar arthrodesis Repair of right peroneus brevis tendon Repair of dislocated peroneal tendons with fibular osteotomy Partial resection,craterization of distal fibula  SURGEON: Melony Overly, MD  ASSISTANTLevada Dy, RNFA  ANESTHESIA: General with peripheral nerve block  FINDINGS: See below  IMPLANTS: Arthrex 6.7 mm partially-threaded cannulated screws, 2-0 FiberWire  INDICATIONS:54 y.o. female sustained a calcaneus fracture several months ago.  She went on to heal in a malunited position and developed subtalar joint arthritis and hindfoot valgus.  She also had pain along her peroneal tendons and there was concern for peroneal tendon dislocation and tearing of the tendons due to her lateral wall blowout.  She failed conservative treatment form of boot immobilization, activity modifications and anti-inflammatory use.  She requested surgery.   Patient understood the risks, benefits and alternatives to surgery which include but are not limited to wound healing complications, infection, nonunion, malunion, need for further surgery as well as damage to surrounding structures. They also understood the potential for continued pain in that there were no guarantees of acceptable outcome After weighing these risks the patient opted to proceed with surgery.  PROCEDURE: Patient was identified in the preoperative holding area.  The right was marked by myself.  Consent was signed by myself and  the patient.  Block was performed by anesthesia in the preoperative holding area.  Patient was taken to the operative suite and placed supine on the operative table.  General LMA anesthesia anesthesia was induced without difficulty. Bump was placed under the operative hip and bone foam was used.  All bony prominences were well padded.  Tourniquet was placed on the operative thigh.  Preoperative antibiotics were given. The extremity was prepped and draped in the usual sterile fashion and surgical timeout was performed.  The limb was elevated and the tourniquet was inflated to 250 mmHg.  Open treatment of calcaneus fracture: We began by making a sinus Tarsi approach incision from the tip of the fibula in line with the fourth ray.  This was taken sharply down through skin and subcutaneous tissue.  Blunt dissection was used to identify the area of the sinus Tarsi.  The distal aspect of the peroneal tendons were identified and the sheath was incised to allow for retraction of the peroneal tendons.  Then the sinus Tarsi was entered sharply.  The subtalar joint ligaments were transected to allow for mobilization of the subtalar joint.  There was significant amount of cartilage damage and obvious malunion of the posterior facet of the calcaneus an lateral wall.  The joint was fully mobilized.  The fracture site was identified and inspected.  Portions of the lateral wall were significantly displaced laterally and these were removed with a rondure.  The cartilage was damaged and missing within the subtalar joint notably along the posterior facet.  There was impingement within the sinus Tarsi.  This area was depressed.  Fracture callus and soft tissue was removed from the subtalar joint using a 15 blade and a rondure.   Subtalar arthrodesis: Next, a lamina spreader was placed within the joint.  The subtalar joint was opened.  The ligaments were then transected to allow for  further mobilization of the joint.  Then using  an osteotome and a curette the cartilage surfaces of the plantar aspect of the talus and the posterior condyle and anterior facet as well as the dorsal aspect of the calcaneus was denuded of cartilage.  The cartilage was fully removed.  Irrigation of the joint was performed and remaining soft tissue and cartilaginous tissue was removed with a rondure.  Then using a 2.5 mm drill bit joint surfaces were trephinated.  This included the anterior, middle and posterior facet of the subtalar joint.  Then the joint was held in an acceptably reduced position and a K wire was placed from the dorsal talar head across the subtalar joint to the posterior aspect of the calcaneal tuberosity.  X-ray fluoroscopy confirmed appropriate position of the K wire.  Then the length was measured.  A single 6.7 mm partially-threaded cannulated screw was placed over the wire with good fixation and compression across the subtalar joint.  Then a second screw was placed.  This was done through a stab incision on the plantar lateral aspect of the hindfoot.  A second K wire was placed from distal to proximal across the posterior facet of the subtalar joint into the talar body.  X-ray confirmed appropriate position.  The screw was placed without difficulty.  This provided good stability and compression across the subtalar joint.  Repair of peroneus brevis tendon: A separate deep incision was created to gain access to the more proximal portion of the peroneal tendons.  The skin incision was extended proximally in line with the peroneal tendons in the retrofibular area.  A Freer elevator was placed within the peroneal tendon sheath and the superior peroneal retinaculum was tested.  It was found to be incompetent and there is some subluxation of the peroneal tendons in this area.  The tendon sheath was incised in line with the peroneal tendons leaving a small sheath of superior peroneal retinacular tissue for imbrication and reconstruction.  The  peroneal tendons were subluxated from the retrofibular groove and inspected.  The peroneus longus tendon appeared intact but there were some adhesions which were removed sharply with dissection scissors.  The peroneus brevis tendon was inspected and was found to be flat with some attenuated tendon.  The attenuated portion of the tendon was removed.  The tendon was then repaired and tubularized using a 2-0 FiberWire stitch.  Attempt was made to replace the tendons in the retrofibular groove but there was significant amount of scarring and a shallow groove.  Repair of dislocating paroneal tendons wit fibular groove deepening: We then turned our attention to the fibula.  Using a 15 blade and rondure the soft tissue that was interposed within the retrofibular groove was removed.  It was found that there was a very shallow fibular groove.  Using a rondure the groove was deepened by removing the posterior cortical bone from the fibula.  The groove was smoothed  And the tendons were replaced.  The groove needed further deepening and this was performed further using an osteotome.  The peronoeal  tendons were then replaced and found to sit well reduced in the groove.  Using a 2-0 FiberWire stitch the superior peroneal retinacular tissue was imbricated and repaired.  There was good tension on the ligament and the tendons remained in the groove after gentle range of motion.  The the tendons were able to glide within the retrofibular groove.  Partial resection of distal fibula: A separate deep incision was made anterior  and distal to the distal fibula.  There was some bony exostosis in this area and impingement within the calcaneus in this area.  Using a Ronjair the distal aspect of the fibula was resected and create a revised.  This was to allow for increased space between the lateral hindfoot and the distal fibula at the level of the subfibular impingement.  After resection of the fibula there was no further impingement  of the bony surfaces.  The wounds were then irrigated copiously with normal saline.  X-ray fluoroscopy confirmed appropriate screw position.  Final films were taken.  The remaining portion of the peroneal tendon sheath was closed using a 2-0 Vicryl stitch.  The tourniquet was released and hemostasis was obtained.  The skin and subcutaneous tissue was closed using a 3-0 Monocryl and 3-0 nylon stitch.  Soft dressing and a short leg splint was placed.  Patient was awakened from anesthesia and taken to recovery in stable condition.   POST OPERATIVE INSTRUCTIONS: Nonweightbearing right lower extremity Keep splint dry Keep limb elevated Call the office with concerns Follow-up in 2 weeks with nonweightbearing x-rays of the right foot and ankle on arrival.  TOURNIQUET TIME:73 minutes  BLOOD LOSS:  less than 50 mL         DRAINS: none         SPECIMEN: none       COMPLICATIONS:  * No complications entered in OR log *         Disposition: PACU - hemodynamically stable.         Condition: stable

## 2020-01-23 NOTE — Anesthesia Procedure Notes (Signed)
Anesthesia Regional Block: Adductor canal block   Pre-Anesthetic Checklist: ,, timeout performed, Correct Patient, Correct Site, Correct Laterality, Correct Procedure, Correct Position, site marked, Risks and benefits discussed, pre-op evaluation,  At surgeon's request and post-op pain management  Laterality: Right  Prep: Maximum Sterile Barrier Precautions used, chloraprep       Needles:  Injection technique: Single-shot  Needle Type: Echogenic Stimulator Needle     Needle Length: 9cm  Needle Gauge: 22     Additional Needles:   Procedures:,,,, ultrasound used (permanent image in chart),,,,  Narrative:  Start time: 01/23/2020 6:53 AM End time: 01/23/2020 6:55 AM Injection made incrementally with aspirations every 5 mL.  Performed by: Personally  Anesthesiologist: Brennan Bailey, MD  Additional Notes: Risks, benefits, and alternative discussed. Patient gave consent for procedure. Patient prepped and draped in sterile fashion. Sedation administered, patient remains easily responsive to voice. Relevant anatomy identified with ultrasound guidance. Local anesthetic given in 5cc increments with no signs or symptoms of intravascular injection. No pain or paraesthesias with injection. Patient monitored throughout procedure with signs of LAST or immediate complications. Tolerated well. Ultrasound image placed in chart.  Tawny Asal, MD

## 2020-01-23 NOTE — Anesthesia Procedure Notes (Signed)
Procedure Name: Intubation Date/Time: 01/23/2020 7:42 AM Performed by: Trinna Post., CRNA Pre-anesthesia Checklist: Patient identified, Emergency Drugs available, Suction available, Patient being monitored and Timeout performed Patient Re-evaluated:Patient Re-evaluated prior to induction Oxygen Delivery Method: Circle system utilized Preoxygenation: Pre-oxygenation with 100% oxygen Induction Type: IV induction Ventilation: Mask ventilation without difficulty Laryngoscope Size: Mac and 3 Grade View: Grade II Tube type: Oral Tube size: 7.0 mm Number of attempts: 1 Airway Equipment and Method: Stylet Placement Confirmation: ETT inserted through vocal cords under direct vision,  positive ETCO2 and breath sounds checked- equal and bilateral Secured at: 22 cm Tube secured with: Tape Dental Injury: Teeth and Oropharynx as per pre-operative assessment

## 2020-01-23 NOTE — Transfer of Care (Signed)
Immediate Anesthesia Transfer of Care Note  Patient: Rachael Watson  Procedure(s) Performed: 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, SUBTALAR ARTHRODESIS, REPAIR OF PERONEAL TENDONS, REPAIR OF DISLOCATED PERONEAL TENDONS (Right )  Patient Location: PACU  Anesthesia Type:General and Regional  Level of Consciousness: awake and patient cooperative  Airway & Oxygen Therapy: Patient Spontanous Breathing and Patient connected to face mask oxygen  Post-op Assessment: Report given to RN and Post -op Vital signs reviewed and stable  Post vital signs: Reviewed and stable  Last Vitals:  Vitals Value Taken Time  BP 111/53 01/23/20 0950  Temp 36.4 C 01/23/20 0950  Pulse 88 01/23/20 0956  Resp 24 01/23/20 0956  SpO2 94 % 01/23/20 0956  Vitals shown include unvalidated device data.  Last Pain:  Vitals:   01/23/20 0950  TempSrc:   PainSc: (P) 0-No pain      Patients Stated Pain Goal: 3 (87/21/58 7276)  Complications: No complications documented.

## 2020-01-24 ENCOUNTER — Telehealth (INDEPENDENT_AMBULATORY_CARE_PROVIDER_SITE_OTHER): Payer: Medicaid Other | Admitting: Psychiatry

## 2020-01-24 ENCOUNTER — Encounter (HOSPITAL_COMMUNITY): Payer: Self-pay | Admitting: Psychiatry

## 2020-01-24 DIAGNOSIS — F0630 Mood disorder due to known physiological condition, unspecified: Secondary | ICD-10-CM

## 2020-01-24 DIAGNOSIS — R41840 Attention and concentration deficit: Secondary | ICD-10-CM | POA: Diagnosis not present

## 2020-01-24 DIAGNOSIS — F4310 Post-traumatic stress disorder, unspecified: Secondary | ICD-10-CM

## 2020-01-24 MED ORDER — LORAZEPAM 0.5 MG PO TABS: 0.5000 mg | ORAL_TABLET | Freq: Every day | ORAL | 2 refills | Status: DC | PRN

## 2020-01-24 MED ORDER — LURASIDONE HCL 40 MG PO TABS: 40.0000 mg | ORAL_TABLET | Freq: Every day | ORAL | 0 refills | Status: DC

## 2020-01-24 MED ORDER — PRAZOSIN HCL 2 MG PO CAPS: 2.0000 mg | ORAL_CAPSULE | Freq: Every day | ORAL | 0 refills | Status: DC

## 2020-01-24 NOTE — Patient Instructions (Signed)
1. Continue duloxetine 30 mg daily  2.ContinueLatuda40 mg daily 3. Continue lorazepam 0.5 mg daily as needed for anxiety  4 Continue prazosin2 mg at night 5. Referral to neuropsychological testing for ADHD 5.Next appointment: 10/13 at 10 AM

## 2020-01-31 ENCOUNTER — Encounter (HOSPITAL_COMMUNITY): Payer: Self-pay | Admitting: Orthopaedic Surgery

## 2020-02-01 ENCOUNTER — Encounter: Payer: Self-pay | Admitting: Psychology

## 2020-02-07 DIAGNOSIS — S92011D Displaced fracture of body of right calcaneus, subsequent encounter for fracture with routine healing: Secondary | ICD-10-CM | POA: Diagnosis not present

## 2020-02-12 ENCOUNTER — Telehealth: Payer: Self-pay | Admitting: Family

## 2020-02-12 ENCOUNTER — Other Ambulatory Visit: Payer: Self-pay | Admitting: Family

## 2020-02-12 NOTE — Telephone Encounter (Signed)
  Prescription Request  02/12/2020  What is the name of the medication or equipment? Albuterol & Proair  Have you contacted your pharmacy to request a refill? (if applicable) No  Which pharmacy would you like this sent to? [facility]   Patient notified that their request is being sent to the clinical staff for review and that they should receive a response within 2 business days.   Lenna Gilford' pt.  Please call pt.  She only uses, when needed.

## 2020-02-13 NOTE — Telephone Encounter (Signed)
Pt aware refill sent to pharmacy this morning 

## 2020-02-15 ENCOUNTER — Other Ambulatory Visit: Payer: Self-pay | Admitting: Family

## 2020-02-26 ENCOUNTER — Other Ambulatory Visit: Payer: Self-pay

## 2020-02-26 ENCOUNTER — Encounter: Payer: Self-pay | Admitting: Internal Medicine

## 2020-02-26 ENCOUNTER — Ambulatory Visit (INDEPENDENT_AMBULATORY_CARE_PROVIDER_SITE_OTHER): Payer: Medicaid Other | Admitting: Internal Medicine

## 2020-02-26 DIAGNOSIS — F17200 Nicotine dependence, unspecified, uncomplicated: Secondary | ICD-10-CM | POA: Diagnosis not present

## 2020-02-26 DIAGNOSIS — J449 Chronic obstructive pulmonary disease, unspecified: Secondary | ICD-10-CM

## 2020-02-26 MED ORDER — BREZTRI AEROSPHERE 160-9-4.8 MCG/ACT IN AERO: 2.0000 | INHALATION_SPRAY | Freq: Two times a day (BID) | RESPIRATORY_TRACT | 11 refills | Status: DC

## 2020-02-26 MED ORDER — BREZTRI AEROSPHERE 160-9-4.8 MCG/ACT IN AERO: 2.0000 | INHALATION_SPRAY | Freq: Two times a day (BID) | RESPIRATORY_TRACT | 0 refills | Status: DC
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
## 2020-02-26 NOTE — Assessment & Plan Note (Signed)
Active smoking - 02/26/2020  After extensive coaching inhaler device,  effectiveness =    75% (short ti)   > try breztri  DDX of  difficult airways management almost all start with A and  include Adherence, Ace Inhibitors, Acid Reflux, Active Sinus Disease, Alpha 1 Antitripsin deficiency, Anxiety masquerading as Airways dz,  ABPA,  Allergy(esp in young), Aspiration (esp in elderly), Adverse effects of meds,  Active smoking or vaping, A bunch of PE's (a small clot burden can't cause this syndrome unless there is already severe underlying pulm or vascular dz with poor reserve) plus two Bs  = Bronchiectasis and Beta blocker use..and one C= CHF   Adherence is always the initial "prime suspect" and is a multilayered concern that requires a "trust but verify" approach in every patient - starting with knowing how to use medications, especially inhalers, correctly, keeping up with refills and understanding the fundamental difference between maintenance and prns vs those medications only taken for a very short course and then stopped and not refilled.  - see hfa teaching - return with all meds in hand using a trust but verify approach to confirm accurate Medication  Reconciliation The principal here is that until we are certain that the  patients are doing what we've asked, it makes no sense to ask them to do more.    Active smoking see sep a/p  ? Asthma component > high dose ics in breztri should cover   ? Adverse effects of dpi > try off    ? Alpha one  Def > check next ov if confirm copd by pfts    ? Anxiety/depression/ deconditioning  > usually at the bottom of this list of usual suspects but should be much higher on this pt's based on H and P and note already on psychotropics and may interfere with adherence and ability to quit smoking  and also interpretation of response or lack thereof to symptom management which can be quite subjective.      >>> f/u with pfts nex   Pt informed of the  seriousness of COVID 19 infection as a direct risk to lung health  and safey and to close contacts and should continue to wear a facemask in public and minimize exposure to public locations but especially avoid any area or activity where non-close contacts are not observing distancing or wearing an appropriate face mask.  I strongly recommended she take either of the vaccines available through local drugstores based on updated information on millions of Americans treated with the   [employer] products  which have proven both safe and  effective even against the new delta variant and now approved with h/o intol to moderna it's the best choice.

## 2020-02-26 NOTE — Assessment & Plan Note (Addendum)
3 min discussion re active cigarette smoking in addition to office E&M  Ask about tobacco use:  Ongoing  Advise quitting   Suggested e-cigs as an optional  "one way bridge"  Off all tobacco products  Assess willingness:  Not committed at this point Assist in quit attempt:  Per PCP when ready Arrange follow up:   Follow up per Primary Care planned     Each maintenance medication was reviewed in detail including emphasizing most importantly the difference between maintenance and prns and under what circumstances the prns are to be triggered using an action plan format where appropriate.  Total time for H and P, chart review, counseling, teaching device and generating customized AVS unique to this office visit / charting =49 min

## 2020-02-26 NOTE — Patient Instructions (Addendum)
Suggest  e-cigs as an optional  "one way bridge"  Off all tobacco products   Plan A = Automatic = Always=    Breztri Take 2 puffs first thing in am and then another 2 puffs about 12 hours later.    Work on inhaler technique:  relax and gently blow all the way out then take a nice smooth deep breath back in, triggering the inhaler at same time you start breathing in.  Hold for up to 5 seconds if you can. Blow out thru nose. Rinse and gargle with water when done     Plan B = Backup (to supplement plan A, not to replace it) Only use your albuterol inhaler as a rescue medication to be used if you can't catch your breath by resting or doing a relaxed purse lip breathing pattern.  - The less you use it, the better it will work when you need it. - Ok to use the inhaler up to 2 puffs  every 4 hours if you must but call for appointment if use goes up over your usual need - Don't leave home without it !!  (think of it like the spare tire for your car)   Plan C = Crisis (instead of Plan B but only if Plan B stops working) - only use your albuterol nebulizer if you first try Plan B and it fails to help > ok to use the nebulizer up to every 4 hours but if start needing it regularly call for immediate appointment   Plan D = Doctor - call me if B and C not adequate   Go ahead and take the pfizer vaccine asap    Please schedule a follow up office visit in 6 weeks, call sooner if needed with pfts on return

## 2020-03-01 ENCOUNTER — Other Ambulatory Visit: Payer: Self-pay | Admitting: Family

## 2020-03-01 NOTE — Telephone Encounter (Signed)
  Prescription Request  03/01/2020  What is the name of the medication or equipment? Restasis-Individual white box bc pt had hand surgery & can't use squeeze bottles bc she can't control her amount.  Have you contacted your pharmacy to request a refill? (if applicable) Yes  Which pharmacy would you like this sent to? [facility]   Patient notified that their request is being sent to the clinical staff for review and that they should receive a response within 2 business days.   Lenna Gilford' pt.  Please call pt.

## 2020-03-04 MED ORDER — RESTASIS 0.05 % OP EMUL: 1.0000 [drp] | Freq: Two times a day (BID) | OPHTHALMIC | 2 refills | Status: DC

## 2020-03-04 NOTE — Telephone Encounter (Signed)
Please advise, do you order this as the 1.5 mls

## 2020-03-12 ENCOUNTER — Other Ambulatory Visit (HOSPITAL_COMMUNITY)
Admission: RE | Admit: 2020-03-12 | Discharge: 2020-03-12 | Disposition: A | Discharge status: Home / Self Care | Payer: Medicaid Other | Source: Ambulatory Visit | Attending: Internal Medicine | Admitting: Internal Medicine

## 2020-03-12 ENCOUNTER — Other Ambulatory Visit: Payer: Self-pay

## 2020-03-12 DIAGNOSIS — Z20822 Contact with and (suspected) exposure to covid-19: Secondary | ICD-10-CM | POA: Insufficient documentation

## 2020-03-12 DIAGNOSIS — Z01812 Encounter for preprocedural laboratory examination: Secondary | ICD-10-CM | POA: Diagnosis present

## 2020-03-12 LAB — SARS CORONAVIRUS 2 (TAT 6-24 HRS): SARS Coronavirus 2: NEGATIVE

## 2020-03-13 NOTE — Progress Notes (Signed)
Called and left detailed message on voicemail per DPR about SARS result per Dr Melvyn Novas. Advised to please feel free to call back if any questions/concerns. Contact phone number left. Nothing further needed at this time.

## 2020-03-14 ENCOUNTER — Other Ambulatory Visit: Payer: Self-pay

## 2020-03-14 ENCOUNTER — Ambulatory Visit (HOSPITAL_COMMUNITY)
Admission: RE | Admit: 2020-03-14 | Discharge: 2020-03-14 | Disposition: A | Discharge status: Home / Self Care | Payer: Medicaid Other | Source: Ambulatory Visit | Attending: Internal Medicine | Admitting: Internal Medicine

## 2020-03-14 DIAGNOSIS — J449 Chronic obstructive pulmonary disease, unspecified: Secondary | ICD-10-CM | POA: Diagnosis not present

## 2020-03-14 LAB — PULMONARY FUNCTION TEST
DL/VA % pred: 74 %
DL/VA: 3.15 ml/min/mmHg/L
DLCO unc % pred: 63 %
DLCO unc: 14.17 ml/min/mmHg
FEF 25-75 Post: 2.38 L/sec
FEF 25-75 Pre: 2.18 L/sec
FEF2575-%Change-Post: 8 %
FEF2575-%Pred-Post: 87 %
FEF2575-%Pred-Pre: 80 %
FEV1-%Change-Post: 3 %
FEV1-%Pred-Post: 80 %
FEV1-%Pred-Pre: 78 %
FEV1-Post: 2.34 L
FEV1-Pre: 2.28 L
FEV1FVC-%Change-Post: -1 %
FEV1FVC-%Pred-Pre: 101 %
FEV6-%Change-Post: 4 %
FEV6-%Pred-Post: 81 %
FEV6-%Pred-Pre: 78 %
FEV6-Post: 2.94 L
FEV6-Pre: 2.82 L
FEV6FVC-%Pred-Post: 103 %
FEV6FVC-%Pred-Pre: 103 %
FVC-%Change-Post: 4 %
FVC-%Pred-Post: 79 %
FVC-%Pred-Pre: 76 %
FVC-Post: 2.94 L
FVC-Pre: 2.82 L
Post FEV1/FVC ratio: 80 %
Post FEV6/FVC ratio: 100 %
Pre FEV1/FVC ratio: 81 %
Pre FEV6/FVC Ratio: 100 %

## 2020-03-14 MED ORDER — ALBUTEROL SULFATE (2.5 MG/3ML) 0.083% IN NEBU
2.5000 mg | INHALATION_SOLUTION | Freq: Once | RESPIRATORY_TRACT | Status: AC
  Administered 2020-03-14: 2.5 mg via RESPIRATORY_TRACT

## 2020-03-19 ENCOUNTER — Other Ambulatory Visit: Payer: Self-pay | Admitting: Family Medicine

## 2020-03-19 ENCOUNTER — Telehealth: Payer: Self-pay | Admitting: Family

## 2020-03-19 NOTE — Telephone Encounter (Signed)
Please find out what the 2 medications are.

## 2020-03-20 NOTE — Telephone Encounter (Signed)
rc for nurse 

## 2020-03-20 NOTE — Telephone Encounter (Signed)
She is taking Linzess 290 2 pills. Please advise

## 2020-03-20 NOTE — Telephone Encounter (Signed)
One is the maximum. Two are not safe. She can use miralax up to four doses a dayif necessary along with one linzess. She needs to follow up with her PCP soon.

## 2020-03-20 NOTE — Telephone Encounter (Signed)
Pt returned missed call from [facility] regarding med issue and constipation. Reviewed Dr Livia Snellen note with pt. Pt voiced understanding but says she has already tried taking the miralax as much as she is allowed to per day and that doesn't help. Pt says she just needs to be switched to a different medicine that is stronger than the linzess. Pt scheduled an appt to see Alyse Low for this issue on 03/29/20.

## 2020-03-25 NOTE — Progress Notes (Signed)
Spoke with pt and notified of results per Dr. Wert. Pt verbalized understanding and denied any questions. 

## 2020-03-29 ENCOUNTER — Ambulatory Visit: Payer: Self-pay | Admitting: Family

## 2020-04-01 ENCOUNTER — Other Ambulatory Visit: Payer: Self-pay | Admitting: Cardiology
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
## 2020-04-11 ENCOUNTER — Ambulatory Visit: Payer: Medicaid Other | Admitting: Internal Medicine

## 2020-04-12 ENCOUNTER — Ambulatory Visit (INDEPENDENT_AMBULATORY_CARE_PROVIDER_SITE_OTHER): Payer: Medicaid Other | Admitting: Family

## 2020-04-12 ENCOUNTER — Encounter: Payer: Self-pay | Admitting: Family

## 2020-04-12 DIAGNOSIS — R0683 Snoring: Secondary | ICD-10-CM | POA: Diagnosis not present

## 2020-04-12 DIAGNOSIS — R5383 Other fatigue: Secondary | ICD-10-CM

## 2020-04-12 DIAGNOSIS — R400 Somnolence: Secondary | ICD-10-CM

## 2020-04-12 DIAGNOSIS — F17200 Nicotine dependence, unspecified, uncomplicated: Secondary | ICD-10-CM

## 2020-04-12 DIAGNOSIS — E669 Obesity, unspecified: Secondary | ICD-10-CM

## 2020-04-12 MED ORDER — RESTASIS 0.05 % OP EMUL: 1.0000 [drp] | Freq: Two times a day (BID) | OPHTHALMIC | 2 refills | Status: DC

## 2020-04-12 NOTE — Progress Notes (Signed)
   Virtual Visit via telephone Note Due to COVID-19 pandemic this visit was conducted virtually. This visit type was conducted due to national recommendations for restrictions regarding the COVID-19 Pandemic (e.g. social distancing, sheltering in place) in an effort to limit this patient's exposure and mitigate transmission in our community. All issues noted in this document were discussed and addressed.  A physical exam was not performed with this format.  I connected with Rachael Watson on 04/12/20 at 9:02 AM  by telephone and verified that I am speaking with the correct person using two identifiers. Rachael Watson is currently located at care and  No one is currently with  her during visit. The provider, Evelina Dun, FNP is located in their office at time of visit.  I discussed the limitations, risks, security and privacy concerns of performing an evaluation and management service by telephone and the availability of in person appointments. I also discussed with the patient that there may be a patient responsible charge related to this service. The patient expressed understanding and agreed to proceed.   History and Present Illness:  HPI  Pt calls the office today to request a referral for sleep study. She reports she has been having day time sleepiness, waking up with headaches, and snoring.   Review of Systems  Constitutional: Positive for malaise/fatigue.  All other systems reviewed and are negative.    Observations/Objective: No SOB or distress   Assessment and Plan: [name] comes in today with chief complaint of No chief complaint on file.   Diagnosis and orders addressed:  1. Current smoker - Ambulatory referral to Neurology  2. Fatigue, unspecified type - Ambulatory referral to Neurology  3. Daytime sleepiness - Ambulatory referral to Neurology  4. Snores - Ambulatory referral to Neurology  5. Obesity (BMI 30-39.9) - Ambulatory referral to  Neurology  Referral placed Encouraged smoking cessation and weight loss     I discussed the assessment and treatment plan with the patient. The patient was provided an opportunity to ask questions and all were answered. The patient agreed with the plan and demonstrated an understanding of the instructions.   The patient was advised to call back or seek an in-person evaluation if the symptoms worsen or if the condition fails to improve as anticipated.  The above assessment and management plan was discussed with the patient. The patient verbalized understanding of and has agreed to the management plan. Patient is aware to call the clinic if symptoms persist or worsen. Patient is aware when to return to the clinic for a follow-up visit. Patient educated on when it is appropriate to go to the emergency department.   Time call ended:  9:20 AM  I provided 18 minutes of non-face-to-face time during this encounter.    Evelina Dun, FNP

## 2020-04-17 ENCOUNTER — Telehealth (INDEPENDENT_AMBULATORY_CARE_PROVIDER_SITE_OTHER): Payer: Medicaid Other | Admitting: Psychiatry

## 2020-04-17 ENCOUNTER — Other Ambulatory Visit: Payer: Self-pay

## 2020-04-17 ENCOUNTER — Encounter (HOSPITAL_COMMUNITY): Payer: Self-pay | Admitting: Psychiatry

## 2020-04-17 DIAGNOSIS — F4310 Post-traumatic stress disorder, unspecified: Secondary | ICD-10-CM | POA: Diagnosis not present

## 2020-04-17 DIAGNOSIS — F0630 Mood disorder due to known physiological condition, unspecified: Secondary | ICD-10-CM | POA: Diagnosis not present

## 2020-04-17 MED ORDER — DULOXETINE HCL 60 MG PO CPEP
60.0000 mg | ORAL_CAPSULE | Freq: Every day | ORAL | 1 refills | Status: DC
Start: 2020-04-17 — End: 2020-06-05

## 2020-04-17 MED ORDER — PRAZOSIN HCL 2 MG PO CAPS
2.0000 mg | ORAL_CAPSULE | Freq: Every day | ORAL | 0 refills | Status: DC
Start: 2020-04-24 — End: 2020-06-19

## 2020-04-17 MED ORDER — LURASIDONE HCL 40 MG PO TABS
40.0000 mg | ORAL_TABLET | Freq: Every day | ORAL | 0 refills | Status: DC
Start: 2020-04-23 — End: 2020-08-07

## 2020-04-17 NOTE — Patient Instructions (Signed)
1. Increase duloxetine 60 mg daily  2.ContinueLatuda40 mg daily 3. Continue lorazepam 0.5 mg daily as needed for anxiety  4Continue prazosin2 mg at night 5.Next appointment: 12/1 at 11:40

## 2020-04-25 ENCOUNTER — Telehealth: Payer: Self-pay | Admitting: Internal Medicine

## 2020-04-25 NOTE — Telephone Encounter (Signed)
[facility] x 1 for pt - Does she need a printed rx for Breztri or is it ok to send electronically? Last Breztri rx was printed.

## 2020-04-26 MED ORDER — BREZTRI AEROSPHERE 160-9-4.8 MCG/ACT IN AERO: 2.0000 | INHALATION_SPRAY | Freq: Two times a day (BID) | RESPIRATORY_TRACT | 11 refills | Status: DC

## 2020-04-26 NOTE — Telephone Encounter (Signed)
Called and spoke to patient.  Rx for [name] has been sent electronically to [facility] per patient request.  Nothing further needed.

## 2020-05-02 ENCOUNTER — Encounter: Payer: Self-pay | Admitting: Psychology

## 2020-05-02 ENCOUNTER — Encounter: Payer: Medicaid Other | Attending: Psychology | Admitting: Psychology

## 2020-05-02 ENCOUNTER — Other Ambulatory Visit: Payer: Self-pay

## 2020-05-02 DIAGNOSIS — F4312 Post-traumatic stress disorder, chronic: Secondary | ICD-10-CM | POA: Diagnosis not present

## 2020-05-02 DIAGNOSIS — F0630 Mood disorder due to known physiological condition, unspecified: Secondary | ICD-10-CM | POA: Diagnosis not present

## 2020-05-02 DIAGNOSIS — F908 Attention-deficit hyperactivity disorder, other type: Secondary | ICD-10-CM | POA: Diagnosis not present
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
## 2020-05-04 ENCOUNTER — Other Ambulatory Visit: Payer: Self-pay | Admitting: Family

## 2020-05-08 ENCOUNTER — Telehealth: Payer: Self-pay | Admitting: Internal Medicine

## 2020-05-08 NOTE — Telephone Encounter (Signed)
Spoke with the pt  She states that the [name] is not covered  She is asking for alternative  She is going to call her insurance and get preferred list and call us back

## 2020-05-10 ENCOUNTER — Other Ambulatory Visit: Payer: Self-pay

## 2020-05-10 ENCOUNTER — Ambulatory Visit (HOSPITAL_COMMUNITY): Payer: Medicaid Other | Admitting: Clinical

## 2020-05-10 NOTE — Telephone Encounter (Signed)
Spoke to patient for update on Breztri alternatives. Patient stated that contacted insurance and was advised that a PA could be started.  She is requesting that we start PA, as this medication works well for her.  I have spoke to [place] with [place] and requested that she send PA request to our office.

## 2020-05-14 ENCOUNTER — Other Ambulatory Visit: Payer: Self-pay | Admitting: *Deleted

## 2020-05-14 MED ORDER — MECLIZINE HCL 25 MG PO TABS: 25.0000 mg | ORAL_TABLET | Freq: Three times a day (TID) | ORAL | 2 refills | Status: DC | PRN

## 2020-05-15 NOTE — Telephone Encounter (Signed)
ATC call [place] to request for PA to be faxed to [facility] office.   Pharmacy is closed and will reopen at 9:00a. Will call back.

## 2020-05-16 NOTE — Telephone Encounter (Signed)
[employer] and spoke with Angie  Asked for info on PA so we can start this  Called [phone] to initiate PA for [facility]  ID [id number]   PA has been initiated - Case ID # [id number]   Pt aware and samples left in [place] for her to pick up since she is out of med Will forward to myself to f/u on

## 2020-05-16 NOTE — Telephone Encounter (Signed)
PA request received from [place] requested: [name] CMM Key: [id number] Tried/failed: Rosana Berger Covered alternatives: advair, dulera, symbicort PA request has been sent to plan, and a determination is expected within 3 days.   Routing to leslie for follow-up.

## 2020-05-17 ENCOUNTER — Encounter: Payer: Medicaid Other | Admitting: Psychology

## 2020-05-17 ENCOUNTER — Telehealth: Payer: Self-pay | Admitting: Internal Medicine

## 2020-05-17 MED ORDER — BREZTRI AEROSPHERE 160-9-4.8 MCG/ACT IN AERO: 2.0000 | INHALATION_SPRAY | Freq: Two times a day (BID) | RESPIRATORY_TRACT | 0 refills | Status: DC

## 2020-05-17 NOTE — Telephone Encounter (Signed)
Patient stopped in the [place] office to pick up two samples of Breztri. Patient had called and spoke with the [facility] office yesterday and writer was asked to put two samples of breztri out for patient to pick up. Nothing further needed at this time.

## 2020-05-17 NOTE — Telephone Encounter (Signed)
Received denial letter on Breztri  Pt still needs to try symbicort and we only have record that she has had breo and advair  Dr Melvyn Novas- please advise thanks

## 2020-05-17 NOTE — Telephone Encounter (Signed)
symbicort 160 2bid  

## 2020-05-17 NOTE — Telephone Encounter (Signed)
ATC patient unable to reach LM to call back office (x1)  

## 2020-05-20 ENCOUNTER — Ambulatory Visit: Payer: Medicaid Other | Admitting: Internal Medicine

## 2020-05-21 MED ORDER — BUDESONIDE-FORMOTEROL FUMARATE 160-4.5 MCG/ACT IN AERO: 2.0000 | INHALATION_SPRAY | Freq: Two times a day (BID) | RESPIRATORY_TRACT | 11 refills | Status: DC

## 2020-05-21 NOTE — Telephone Encounter (Signed)
Called and spoke to pt. Informed her of the change in medication. Rx sent to preferred pharmacy. Pt verbalized understanding and denied any further questions or concerns at this time.

## 2020-05-22 ENCOUNTER — Other Ambulatory Visit: Payer: Self-pay | Admitting: Family

## 2020-05-22 DIAGNOSIS — E039 Hypothyroidism, unspecified: Secondary | ICD-10-CM
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
## 2020-06-03 ENCOUNTER — Encounter: Payer: Medicaid Other | Attending: Psychology | Admitting: Psychology

## 2020-06-03 ENCOUNTER — Other Ambulatory Visit: Payer: Self-pay

## 2020-06-03 ENCOUNTER — Encounter: Payer: Self-pay | Admitting: Psychology

## 2020-06-03 DIAGNOSIS — F908 Attention-deficit hyperactivity disorder, other type: Secondary | ICD-10-CM

## 2020-06-03 DIAGNOSIS — Z13220 Encounter for screening for lipoid disorders: Secondary | ICD-10-CM | POA: Diagnosis not present

## 2020-06-03 DIAGNOSIS — Z131 Encounter for screening for diabetes mellitus: Secondary | ICD-10-CM | POA: Diagnosis not present

## 2020-06-03 DIAGNOSIS — M5441 Lumbago with sciatica, right side: Secondary | ICD-10-CM | POA: Diagnosis not present

## 2020-06-03 DIAGNOSIS — Z114 Encounter for screening for human immunodeficiency virus [HIV]: Secondary | ICD-10-CM | POA: Diagnosis not present

## 2020-06-03 DIAGNOSIS — R5383 Other fatigue: Secondary | ICD-10-CM | POA: Diagnosis not present

## 2020-06-03 DIAGNOSIS — G8929 Other chronic pain: Secondary | ICD-10-CM | POA: Diagnosis not present

## 2020-06-03 DIAGNOSIS — E559 Vitamin D deficiency, unspecified: Secondary | ICD-10-CM | POA: Diagnosis not present

## 2020-06-03 DIAGNOSIS — M5442 Lumbago with sciatica, left side: Secondary | ICD-10-CM | POA: Diagnosis not present

## 2020-06-03 DIAGNOSIS — M129 Arthropathy, unspecified: Secondary | ICD-10-CM | POA: Diagnosis not present

## 2020-06-03 NOTE — Progress Notes (Signed)
° °  Neuropsychology Note  Rachael Watson completed 120 minutes of neuropsychological testing with this provider.    MENTAL STATUS Appearance: Casually and appropriately dressed with good hygiene. Gait: Ambulated independently without assistance.  Speech: Clear, normal rate, tone & volume. Thought process: Goal directed, linear, and logical Mood/Affect: Mildly anxious appropriate range. Fidgety throughout testing.  Interpersonal: Polite and appropriate.  Orientation: O x 4  Effort/Motivation: Good   BEHAVIORAL OBSERVATIONS:  The patient did not appear overtly distressed by the testing session, per behavioral observation or via self-report. Rest breaks were offered. She was cooperative with all assigned tasks and appeared to give good effort.   Tests Administered:  Comprehensive Attention Battery (CAB)  Continuous Performance Test (CPT)  Results: Will be included in final report. Feedback to Patient: Rachael Watson will return  for an interactive feedback session with Dr. Sima Matas on 07/24/20 at which time her test performances, clinical impressions and treatment recommendations will be reviewed in detail. The patient understands she can contact our office should she require our assistance before this time.  Full report to follow.

## 2020-06-05 ENCOUNTER — Other Ambulatory Visit: Payer: Self-pay

## 2020-06-05 ENCOUNTER — Encounter: Payer: Self-pay | Admitting: Psychiatry

## 2020-06-05 ENCOUNTER — Telehealth (HOSPITAL_COMMUNITY): Payer: Medicaid Other | Admitting: Psychiatry

## 2020-06-05 ENCOUNTER — Telehealth: Payer: Self-pay | Admitting: Internal Medicine

## 2020-06-05 ENCOUNTER — Telehealth (INDEPENDENT_AMBULATORY_CARE_PROVIDER_SITE_OTHER): Payer: Medicaid Other | Admitting: Psychiatry

## 2020-06-05 DIAGNOSIS — F4310 Post-traumatic stress disorder, unspecified: Secondary | ICD-10-CM

## 2020-06-05 DIAGNOSIS — F0630 Mood disorder due to known physiological condition, unspecified: Secondary | ICD-10-CM

## 2020-06-05 DIAGNOSIS — G4700 Insomnia, unspecified: Secondary | ICD-10-CM

## 2020-06-05 MED ORDER — LORAZEPAM 0.5 MG PO TABS: 0.5000 mg | ORAL_TABLET | Freq: Every day | ORAL | 1 refills | Status: DC | PRN

## 2020-06-05 MED ORDER — ZOLPIDEM TARTRATE 5 MG PO TABS: 5.0000 mg | ORAL_TABLET | Freq: Every evening | ORAL | 1 refills | Status: DC | PRN

## 2020-06-05 MED ORDER — DULOXETINE HCL 30 MG PO CPEP
30.0000 mg | ORAL_CAPSULE | Freq: Every day | ORAL | 0 refills | Status: DC
Start: 2020-06-05 — End: 2020-08-15

## 2020-06-05 NOTE — Telephone Encounter (Signed)
Fine with me

## 2020-06-05 NOTE — Patient Instructions (Signed)
1. Continue duloxetine 30 mg daily 2.IncreaseLatuda40 mg daily 3. Continuelorazepam 0.5 mg daily as needed for anxiety  4Continue prazosin2 mg at night 5. Referred to neuropsychological testing for ADHD 6.Next appointment: 1/5 at 10:40

## 2020-06-05 NOTE — Telephone Encounter (Signed)
Called and spoke with patient, advised her that Dr. Melvyn Novas is fine with her doing a Televisit on 06/07/20.  She verbalized understanding.  She said she was sorry for whoever she went off on when she was on the phone earlier, she said her bipolar and PTST was out of control and she was in pain.  I let her know I would let the staff member know.  Visit changed to televisit.  Nothing further needed.

## 2020-06-05 NOTE — Telephone Encounter (Signed)
Spoke with the pt  She has appt with MW scheduled for 06/07/20  She states that her spinal stenosis has been bothering her and he is "worn out"  Wants to see if she has make this appt on 06/07/20 at televisit  Please advise if this is okay, thanks!

## 2020-06-06 ENCOUNTER — Ambulatory Visit: Payer: Medicaid Other | Admitting: Internal Medicine

## 2020-06-07 ENCOUNTER — Ambulatory Visit (INDEPENDENT_AMBULATORY_CARE_PROVIDER_SITE_OTHER): Payer: Medicaid Other | Admitting: Internal Medicine

## 2020-06-07 ENCOUNTER — Encounter: Payer: Self-pay | Admitting: Internal Medicine

## 2020-06-07 ENCOUNTER — Other Ambulatory Visit: Payer: Self-pay

## 2020-06-07 DIAGNOSIS — F17200 Nicotine dependence, unspecified, uncomplicated: Secondary | ICD-10-CM

## 2020-06-07 DIAGNOSIS — J449 Chronic obstructive pulmonary disease, unspecified: Secondary | ICD-10-CM | POA: Diagnosis not present

## 2020-06-07 NOTE — Assessment & Plan Note (Signed)
Active smoker - 02/26/2020  After extensive coaching inhaler device,  effectiveness =    75% (short ti)   > try breztri - PFT's   03/14/20   FEV1 2.34 (80 % ) ratio 0.80  p 3 % improvement from saba p "2 inhalers"  prior to study with DLCO  14.17 (63%) corrects to 3.15 (74%)  for alv volume and FV curve no significant curvature    ERV 12% - 06/07/2020 changed to symbicort 160 2bid by insurance   Since doesn't meet criteria for copd symbicort 160 probably a good choice but since cough > sob may actually benefit from a lower dose so rec try one bid and if less coughing and no need for saba can use this or go with the 80 2bid option here  >>> f/u 6 mo  Each maintenance medication was reviewed in detail including most importantly the difference between maintenance and as needed and under what circumstances the prns are to be used.  Please see AVS for specific  Instructions which are unique to this visit and I personally typed out  which were reviewed in detail over the phone with the patient and a copy provided via my chart
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
## 2020-06-07 NOTE — Assessment & Plan Note (Signed)
Counseled re importance of smoking cessation but did not meet time criteria for separate billing   °

## 2020-06-10 ENCOUNTER — Other Ambulatory Visit: Payer: Self-pay | Admitting: Family

## 2020-06-10 ENCOUNTER — Telehealth: Payer: Self-pay

## 2020-06-10 NOTE — Telephone Encounter (Signed)
received a fax that a prior auth was needed for the zolpidem 5mg 

## 2020-06-10 NOTE — Telephone Encounter (Signed)
went online to [email or web address] and submited the prior auth.- pending

## 2020-06-11 ENCOUNTER — Other Ambulatory Visit: Payer: Self-pay | Admitting: Internal Medicine

## 2020-06-11 DIAGNOSIS — Z1231 Encounter for screening mammogram for malignant neoplasm of breast: Secondary | ICD-10-CM

## 2020-06-11 NOTE — Telephone Encounter (Signed)
received fax that pt zolpiden 5mg  approved through 12-09-2020
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
## 2020-06-17 ENCOUNTER — Other Ambulatory Visit: Payer: Self-pay

## 2020-06-17 ENCOUNTER — Encounter: Payer: Self-pay | Admitting: Psychology

## 2020-06-17 ENCOUNTER — Encounter: Payer: Medicaid Other | Attending: Psychology | Admitting: Psychology

## 2020-06-17 DIAGNOSIS — F908 Attention-deficit hyperactivity disorder, other type: Secondary | ICD-10-CM | POA: Diagnosis present

## 2020-06-19 ENCOUNTER — Telehealth (INDEPENDENT_AMBULATORY_CARE_PROVIDER_SITE_OTHER): Payer: Medicaid Other | Admitting: Psychiatry

## 2020-06-19 ENCOUNTER — Encounter: Payer: Self-pay | Admitting: Psychiatry

## 2020-06-19 ENCOUNTER — Other Ambulatory Visit: Payer: Self-pay

## 2020-06-19 DIAGNOSIS — F0630 Mood disorder due to known physiological condition, unspecified: Secondary | ICD-10-CM

## 2020-06-19 DIAGNOSIS — F4310 Post-traumatic stress disorder, unspecified: Secondary | ICD-10-CM

## 2020-06-19 MED ORDER — PRAZOSIN HCL 2 MG PO CAPS
2.0000 mg | ORAL_CAPSULE | Freq: Every day | ORAL | 0 refills | Status: DC
Start: 2020-07-25 — End: 2020-09-18

## 2020-06-19 NOTE — Patient Instructions (Signed)
1.Continueduloxetine 30 mg daily- she could not tolerate higher dose due to dizziness 2.conitnueLatuda20 mg daily 3. Continuelorazepam 0.5 mg daily as needed for anxiety 4Continue prazosin2 mg at night 5. Continue Ambien 5 mg at night as needed for sleep 5. Referred to neuropsychological testing for ADHD- result pending 6.Next appointment: 2/2 at 11 AM

## 2020-06-21 NOTE — Progress Notes (Signed)
   Neuropsychology Note  Israa Caban completed 120 minutes of neuropsychological testing with this provider.   Clinical Decision Making: In considering the patient's current level of functioning, level of presumed impairment, nature of symptoms, emotional and behavioral responses during the interview, level of literacy, and observed level of motivation/effort, a battery of tests was selected and completed by patient.  Changes were made as deemed necessary based on patient performance on testing, behavioral observations, and additional pertinent factors such as those listed above.  MENTAL STATUS & BEHAVIORAL OBSERVATIONS:  Appearance: Casually and appropriately dressed with good hygiene. Gait: Ambulated independently without assistance.  Speech: Clear, normal rate, tone & volume. Thought process:  Goal directed, and slightly disorganized, somewhat inattentive and impulsive  Mood/Affect: Mildly anxious appropriate range. Fidgety throughout testing.  Interpersonal: Polite and appropriate.  Orientation: O x 4 Effort/Motivation: Good   She was cooperative with all assigned tasks and appeared to give good effort. She did not have any difficulty hearing or understanding test instructions and did not require additional prompting. The patient did not appear overtly distressed by the testing session, per behavioral observation or via self-report. Rest breaks were offered.  Tests Administered:  . Comprehensive Attention Battery (CAB) . CAB Continuous Performance Test (CAB-CPT) RESULTS:  Will be included in final report.   FEEDBACK TO PATIENT Cloteal Isaacson will return for an interactive feedback session with Dr. Sima Matas on 1/19/21at which time her test performances, clinical impressions and treatment recommendations will be reviewed in detail. The patient understands she can contact our office should she require our assistance before this time.  Full report to follow.

## 2020-06-24 ENCOUNTER — Encounter: Payer: Medicaid Other | Admitting: Psychology

## 2020-07-03 ENCOUNTER — Other Ambulatory Visit: Payer: Self-pay | Admitting: Family

## 2020-07-03 ENCOUNTER — Other Ambulatory Visit: Payer: Self-pay | Admitting: Cardiology

## 2020-07-03 DIAGNOSIS — K5900 Constipation, unspecified: Secondary | ICD-10-CM

## 2020-07-12 DIAGNOSIS — F4312 Post-traumatic stress disorder, chronic: Secondary | ICD-10-CM | POA: Diagnosis not present

## 2020-07-12 DIAGNOSIS — F411 Generalized anxiety disorder: Secondary | ICD-10-CM | POA: Diagnosis not present

## 2020-07-17 ENCOUNTER — Ambulatory Visit: Payer: Medicaid Other | Admitting: Psychology

## 2020-07-19 DIAGNOSIS — E559 Vitamin D deficiency, unspecified: Secondary | ICD-10-CM | POA: Diagnosis not present

## 2020-07-19 DIAGNOSIS — G8929 Other chronic pain: Secondary | ICD-10-CM | POA: Diagnosis not present

## 2020-07-19 DIAGNOSIS — M5441 Lumbago with sciatica, right side: Secondary | ICD-10-CM | POA: Diagnosis not present

## 2020-07-19 DIAGNOSIS — M5442 Lumbago with sciatica, left side: Secondary | ICD-10-CM | POA: Diagnosis not present

## 2020-07-22 DIAGNOSIS — F4312 Post-traumatic stress disorder, chronic: Secondary | ICD-10-CM | POA: Diagnosis not present

## 2020-07-22 DIAGNOSIS — F411 Generalized anxiety disorder: Secondary | ICD-10-CM | POA: Diagnosis not present

## 2020-07-24 ENCOUNTER — Encounter: Payer: Self-pay | Admitting: Psychology

## 2020-07-24 ENCOUNTER — Encounter: Payer: Medicaid Other | Attending: Psychology | Admitting: Psychology

## 2020-07-24 ENCOUNTER — Other Ambulatory Visit: Payer: Self-pay

## 2020-07-24 DIAGNOSIS — F0630 Mood disorder due to known physiological condition, unspecified: Secondary | ICD-10-CM | POA: Diagnosis present

## 2020-07-24 DIAGNOSIS — F4312 Post-traumatic stress disorder, chronic: Secondary | ICD-10-CM | POA: Diagnosis present
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
## 2020-08-02 ENCOUNTER — Ambulatory Visit: Payer: Medicaid Other
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
## 2020-08-07 ENCOUNTER — Other Ambulatory Visit: Payer: Self-pay | Admitting: Family

## 2020-08-07 ENCOUNTER — Other Ambulatory Visit: Payer: Self-pay

## 2020-08-07 ENCOUNTER — Telehealth (INDEPENDENT_AMBULATORY_CARE_PROVIDER_SITE_OTHER): Payer: Medicaid Other | Admitting: Psychiatry

## 2020-08-07 ENCOUNTER — Encounter: Payer: Self-pay | Admitting: Psychiatry

## 2020-08-07 DIAGNOSIS — G4700 Insomnia, unspecified: Secondary | ICD-10-CM

## 2020-08-07 DIAGNOSIS — F603 Borderline personality disorder: Secondary | ICD-10-CM

## 2020-08-07 DIAGNOSIS — F4310 Post-traumatic stress disorder, unspecified: Secondary | ICD-10-CM

## 2020-08-07 MED ORDER — ZOLPIDEM TARTRATE 5 MG PO TABS: 5.0000 mg | ORAL_TABLET | Freq: Every evening | ORAL | 1 refills | Status: DC | PRN

## 2020-08-07 MED ORDER — LURASIDONE HCL 40 MG PO TABS
40.0000 mg | ORAL_TABLET | Freq: Every day | ORAL | 0 refills | Status: DC
Start: 2020-08-07 — End: 2020-09-18

## 2020-08-07 NOTE — Patient Instructions (Signed)
1.Restartduloxetine30 mg daily 2.continueLatuda20 mg daily 3. Continuelorazepam 0.5 mg daily as needed for anxiety 4Continue prazosin2 mg at night  5. Continue Ambien 5 mg at night as needed for sleep 6.Next appointment: 3/16 at 9:30

## 2020-08-12 ENCOUNTER — Telehealth: Payer: Self-pay

## 2020-08-12 ENCOUNTER — Other Ambulatory Visit: Payer: Self-pay | Admitting: Psychiatry

## 2020-08-12 MED ORDER — LORAZEPAM 0.5 MG PO TABS: 0.5000 mg | ORAL_TABLET | Freq: Every day | ORAL | 1 refills | Status: DC | PRN

## 2020-08-12 NOTE — Telephone Encounter (Signed)
pt called states that the pharmacy did not receive the  lorazepam 0.5 mg.

## 2020-08-12 NOTE — Telephone Encounter (Signed)
Ordered.   I have utilized the [place] Controlled Substances Reporting System (PMP AWARxE) to confirm adherence regarding the patient's medication. My review reveals appropriate prescription fills.

## 2020-08-13 ENCOUNTER — Telehealth: Payer: Self-pay

## 2020-08-13 DIAGNOSIS — R0683 Snoring: Secondary | ICD-10-CM

## 2020-08-14 NOTE — Telephone Encounter (Signed)
New referral placed.

## 2020-08-15 ENCOUNTER — Telehealth: Payer: Self-pay

## 2020-08-15 ENCOUNTER — Other Ambulatory Visit: Payer: Self-pay | Admitting: Psychiatry

## 2020-08-15 MED ORDER — DULOXETINE HCL 30 MG PO CPEP
30.0000 mg | ORAL_CAPSULE | Freq: Every day | ORAL | 0 refills | Status: DC
Start: 2020-08-15 — End: 2020-09-18

## 2020-08-15 NOTE — Telephone Encounter (Signed)
Could you contact her if she meant duloxetine 30 mg daily? She might have run out of that medication, so I went ahead and ordered refill. If she meant citalopram, please advise her to discontinue it as she is on duloxetine as well; both medication works in a similar way.

## 2020-08-15 NOTE — Telephone Encounter (Signed)
Pt was not happy she cursed at me and hung up.

## 2020-08-15 NOTE — Telephone Encounter (Signed)
pt called upset she states she needs her citalopram 30mg  sent to the pharmacy i dont see where she is takin this medication

## 2020-08-24 ENCOUNTER — Other Ambulatory Visit: Payer: Self-pay

## 2020-08-24 ENCOUNTER — Ambulatory Visit
Admission: RE | Admit: 2020-08-24 | Discharge: 2020-08-24 | Disposition: A | Discharge status: Home / Self Care | Payer: Medicaid Other | Source: Ambulatory Visit | Attending: Internal Medicine | Admitting: Internal Medicine

## 2020-08-24 DIAGNOSIS — Z1231 Encounter for screening mammogram for malignant neoplasm of breast: Secondary | ICD-10-CM

## 2020-08-29 ENCOUNTER — Ambulatory Visit: Payer: Medicaid Other | Admitting: Podiatry
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
## 2020-09-13 ENCOUNTER — Other Ambulatory Visit: Payer: Self-pay | Admitting: Family

## 2020-09-16 ENCOUNTER — Ambulatory Visit: Payer: Medicaid Other | Admitting: Podiatry

## 2020-09-18 ENCOUNTER — Other Ambulatory Visit: Payer: Self-pay

## 2020-09-18 ENCOUNTER — Telehealth (INDEPENDENT_AMBULATORY_CARE_PROVIDER_SITE_OTHER): Payer: Medicaid Other | Admitting: Psychiatry

## 2020-09-18 ENCOUNTER — Encounter: Payer: Self-pay | Admitting: Psychiatry

## 2020-09-18 DIAGNOSIS — F4310 Post-traumatic stress disorder, unspecified: Secondary | ICD-10-CM

## 2020-09-18 DIAGNOSIS — F603 Borderline personality disorder: Secondary | ICD-10-CM

## 2020-09-18 DIAGNOSIS — F330 Major depressive disorder, recurrent, mild: Secondary | ICD-10-CM

## 2020-09-18 DIAGNOSIS — G4700 Insomnia, unspecified: Secondary | ICD-10-CM

## 2020-09-18 MED ORDER — ZOLPIDEM TARTRATE 5 MG PO TABS: 5.0000 mg | ORAL_TABLET | Freq: Every evening | ORAL | 1 refills | Status: DC | PRN

## 2020-09-18 MED ORDER — LURASIDONE HCL 40 MG PO TABS: 40.0000 mg | ORAL_TABLET | Freq: Every day | ORAL | 1 refills | Status: DC

## 2020-09-18 MED ORDER — PRAZOSIN HCL 2 MG PO CAPS: 2.0000 mg | ORAL_CAPSULE | Freq: Every day | ORAL | 1 refills | Status: DC

## 2020-09-18 MED ORDER — DULOXETINE HCL 30 MG PO CPEP: 30.0000 mg | ORAL_CAPSULE | Freq: Every day | ORAL | 0 refills | Status: DC
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
## 2020-09-23 ENCOUNTER — Ambulatory Visit: Payer: Medicaid Other | Admitting: Family Medicine

## 2020-09-23 DIAGNOSIS — Z8679 Personal history of other diseases of the circulatory system: Secondary | ICD-10-CM

## 2020-09-23 DIAGNOSIS — E782 Mixed hyperlipidemia: Secondary | ICD-10-CM

## 2020-09-23 DIAGNOSIS — R079 Chest pain, unspecified: Secondary | ICD-10-CM
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
## 2020-10-08 ENCOUNTER — Ambulatory Visit (INDEPENDENT_AMBULATORY_CARE_PROVIDER_SITE_OTHER): Payer: Medicaid Other | Admitting: Family Medicine

## 2020-10-08 ENCOUNTER — Ambulatory Visit: Payer: Medicaid Other

## 2020-10-08 ENCOUNTER — Encounter: Payer: Self-pay | Admitting: Family Medicine

## 2020-10-08 ENCOUNTER — Other Ambulatory Visit: Payer: Self-pay

## 2020-10-08 ENCOUNTER — Encounter: Payer: Self-pay | Admitting: *Deleted

## 2020-10-08 VITALS — BP 90/60 | HR 84 | Ht 66.0 in | Wt 206.2 lb

## 2020-10-08 DIAGNOSIS — E782 Mixed hyperlipidemia: Secondary | ICD-10-CM | POA: Diagnosis not present

## 2020-10-08 DIAGNOSIS — R079 Chest pain, unspecified: Secondary | ICD-10-CM | POA: Diagnosis not present

## 2020-10-08 DIAGNOSIS — M79606 Pain in leg, unspecified: Secondary | ICD-10-CM

## 2020-10-08 DIAGNOSIS — Z8679 Personal history of other diseases of the circulatory system: Secondary | ICD-10-CM

## 2020-10-08 NOTE — Patient Instructions (Signed)
Medication Instructions:  Continue all current medications.  Labwork: none  Testing/Procedures:  Your physician has requested that you have a lower extremity arterial duplex. During this test, ultrasound is used to evaluate arterial blood flow in the legs. Allow one hour for this exam. There are no restrictions or special instructions.  Office will contact with results via phone or letter.    Follow-Up: 6 weeks   Any Other Special Instructions Will Be Listed Below (If Applicable).  If you need a refill on your cardiac medications before your next appointment, please call your pharmacy.

## 2020-10-15 ENCOUNTER — Other Ambulatory Visit: Payer: Self-pay | Admitting: Family Medicine

## 2020-10-15 DIAGNOSIS — I739 Peripheral vascular disease, unspecified: Secondary | ICD-10-CM

## 2020-10-17 ENCOUNTER — Other Ambulatory Visit: Payer: Self-pay

## 2020-10-17 ENCOUNTER — Encounter (HOSPITAL_BASED_OUTPATIENT_CLINIC_OR_DEPARTMENT_OTHER): Payer: Self-pay | Admitting: Plastic Surgery

## 2020-10-17 NOTE — Progress Notes (Signed)
Called and left message with Audree Bane that we will need a cardiac clearance and hold time for asa prior to surgery. Awaiting call back.

## 2020-10-21 ENCOUNTER — Telehealth: Payer: Self-pay

## 2020-10-21 NOTE — Telephone Encounter (Signed)
Patient called stating that she needs pre op clearance for [name] [phone] requesting information in order to proceed with her surgical clearance.

## 2020-10-22 ENCOUNTER — Telehealth: Payer: Self-pay | Admitting: Family Medicine

## 2020-10-22 NOTE — Telephone Encounter (Signed)
   Name: Rachael Watson  DOB: 03-28-1966  MRN: [id number]   Primary Cardiologist: Carlyle Dolly, MD  Chart reviewed as part of pre-operative protocol coverage. Patient was contacted 10/22/2020 in reference to pre-operative risk assessment for pending surgery as outlined below.  Rachael Watson was last seen on 10/08/2020 by Levell July NP.  Since that day, Rachael Watson has done well without any chest pain or worsening dyspnea.  Previous cardiac catheterization in 2019 showed normal coronary arteries.  Therefore, based on ACC/AHA guidelines, the patient would be at acceptable risk for the planned procedure without further cardiovascular testing.   The patient was advised that if she develops new symptoms prior to surgery to contact our office to arrange for a follow-up visit, and she verbalized understanding.  I will route this recommendation to the requesting party via Epic fax function and remove from pre-op pool. Please call with questions.  [place], [place] 10/22/2020, 11:11 AM

## 2020-10-22 NOTE — Telephone Encounter (Signed)
   [facility] Pre-operative Risk Assessment    Patient Name: Rachael Watson  DOB: 09-11-1965  MRN: [id number]   HEARTCARE STAFF: - Please ensure there is not already an duplicate clearance open for this procedure. - Under Visit Info/Reason for Call, type in Other and utilize the format Clearance MM/DD/YY or Clearance TBD. Do not use dashes or single digits. - If request is for dental extraction, please clarify the # of teeth to be extracted.  Request for surgical clearance:  1. What type of surgery is being performed?  Bilateral Breast Reduction    2. When is this surgery scheduled?11/15/2020  3. What type of clearance is required (medical clearance vs. Pharmacy clearance to hold med vs. Both)?Medical   4. Are there any medications that need to be held prior to surgery and how long?NA   5. Practice name and name of physician performing surgery?  [facility]  6. What is the office phone number? [phone]   7.   What is the office fax number? [phone]  8.   Anesthesia type (None, local, MAC, general) General    Vicky T Slaughter 10/22/2020, 7:42 AM  _________________________________________________________________   (provider comments below)

## 2020-10-30 ENCOUNTER — Other Ambulatory Visit: Payer: Self-pay | Admitting: Cardiology

## 2020-10-30 DIAGNOSIS — M542 Cervicalgia: Secondary | ICD-10-CM | POA: Diagnosis not present

## 2020-10-30 DIAGNOSIS — M549 Dorsalgia, unspecified: Secondary | ICD-10-CM | POA: Diagnosis not present

## 2020-10-30 DIAGNOSIS — N62 Hypertrophy of breast: Secondary | ICD-10-CM | POA: Diagnosis not present

## 2020-10-30 DIAGNOSIS — L304 Erythema intertrigo: Secondary | ICD-10-CM | POA: Diagnosis not present

## 2020-10-30 DIAGNOSIS — G8929 Other chronic pain: Secondary | ICD-10-CM | POA: Diagnosis not present

## 2020-10-30 NOTE — H&P (Signed)
Subjective:     Patient ID: Rachael Watson is a 55 y.o. female.  HPI  Here for follow up discussion prior to planned breast reduction. Current 40 DD. Reports several year history neck back pain and feels her posture is stooping forward. Has tried back braces, specialty fitted bras, prescription pain medications for over 3 month trial without relief. Reports bilateral hand and arm numbness. She has history bilateral CCTR and right wrist surgery. Reports rashes beneath breasts weekly, improves with prescription topical antifungal then recurs.   Wt- reports baseline 190 lb, prior to [place] vaccine was 197 lb. States develop PNA following vaccine and was on several steroids, weight up 30 lb during this treatment and still trying to get to baseline. Off steroids.  MMG 2.23.22 normal. Notes PA and paternal cousin with breast ca.  On chronic percocet for lumbar stenosis, has pain contract with Dr. Nancy Fetter.  PMH includes history of anxiety, bipolar d/o, asthma, CHF, COPD, DM controlled by diet (HbA1c 5.7 01/2020). The patient also has a history of lumbar stenosis with chronic back pain.  Lives with father and 27 yo daughter. Originally from [place]. Hopes someday to move back there but no plans currently. Disabled secondary to lumbar stenosis, arthritis.  Review of Systems     Objective:   Physical Exam Cardiovascular:     Rate and Rhythm: Normal rate and regular rhythm.     Heart sounds: Normal heart sounds.  Pulmonary:     Effort: Pulmonary effort is normal.     Breath sounds: Normal breath sounds.  Chest:  Breasts:     Right: No axillary adenopathy.     Left: No axillary adenopathy.    Lymphadenopathy:     Upper Body:     Right upper body: No axillary adenopathy.     Left upper body: No axillary adenopathy.  Skin:    Comments: Fitzpatrick 2    Breasts: Grade 3 ptosis bilateral No masses palpable +shoulder grooving SN to nipple R 34 L 36. 5 cm BW R 26 L 28  cm Nipple to IMF R 17 L 16 cm     Assessment:     Macromastia  Chronic neck and back pain Intertrigo    Plan:     Needs to be off all nicotine products for 6 weeks prior to surgery. Plan conitine test preop. Patient states she has quit.  Chronic neck and back pain, intertrigo in setting of macromastia that has failed conservative measures. Breast reduction surgery likely to improve pain, though her pain is multifactorial in nature including lumbar stenosis and arthritis, not solely related to breast size.  Reviewed reduction with anchor type scars, OP surgery, drains, post operative visits and limitations, recovery. Diminished sensation nipple and breast skin, risk of nipple loss, wound healing problems, asymmetry, incidental carcinoma, changes with wt gain/loss, aging, unacceptable cosmetic appearance reviewed. Reviewed increased risk NAC necrosis and wound healing problems in setting of active nicotine use.  Additional risks including but not limited to bleeding infection seroma hematoma blood clots in legs or lungs, poor scarring, need for additional surgery, damage to adjacent structures, cardiopulmonary complications reviewed.  Anticipate 764 g reduction from each breast.   Drain teaching completed. On pain contract with Dr. Nancy Fetter- has appt with him 2 d prior to surgery. Asked her to discuss surgery and tentative plan is that I will defer all Rx to him  Cardiac clearance requested by surgical center/anesthesia. Obtained from Dr. Leonides Sake.  Discussed risk COVID infectionthrough  this elective surgery. Patient will receive COVID testing prior to surgery. Discussed even if patient receivesa negative test result, the tests in some cases may fail to detect the virus or patient maycontract COVID after the test.COVID 19 infectionbefore/during/aftersurgery may result in lead to a higher chance of complication and death.

## 2020-11-08 DIAGNOSIS — F4312 Post-traumatic stress disorder, chronic: Secondary | ICD-10-CM | POA: Diagnosis not present

## 2020-11-08 DIAGNOSIS — F411 Generalized anxiety disorder: Secondary | ICD-10-CM | POA: Diagnosis not present

## 2020-11-12 DIAGNOSIS — G894 Chronic pain syndrome: Secondary | ICD-10-CM | POA: Diagnosis not present

## 2020-11-12 DIAGNOSIS — K7460 Unspecified cirrhosis of liver: Secondary | ICD-10-CM | POA: Diagnosis not present

## 2020-11-12 DIAGNOSIS — K7581 Nonalcoholic steatohepatitis (NASH): Secondary | ICD-10-CM | POA: Diagnosis not present

## 2020-11-12 DIAGNOSIS — M5442 Lumbago with sciatica, left side: Secondary | ICD-10-CM | POA: Diagnosis not present

## 2020-11-12 DIAGNOSIS — M1812 Unilateral primary osteoarthritis of first carpometacarpal joint, left hand: Secondary | ICD-10-CM | POA: Diagnosis not present

## 2020-11-12 DIAGNOSIS — R768 Other specified abnormal immunological findings in serum: Secondary | ICD-10-CM | POA: Diagnosis not present

## 2020-11-12 DIAGNOSIS — M5441 Lumbago with sciatica, right side: Secondary | ICD-10-CM | POA: Diagnosis not present

## 2020-11-15 ENCOUNTER — Ambulatory Visit (HOSPITAL_BASED_OUTPATIENT_CLINIC_OR_DEPARTMENT_OTHER): Admission: RE | Admit: 2020-11-15 | Payer: Medicaid Other | Source: Home / Self Care | Admitting: Plastic Surgery

## 2020-11-15 DIAGNOSIS — M5442 Lumbago with sciatica, left side: Secondary | ICD-10-CM | POA: Diagnosis not present

## 2020-11-15 DIAGNOSIS — M5441 Lumbago with sciatica, right side: Secondary | ICD-10-CM | POA: Diagnosis not present

## 2020-11-15 DIAGNOSIS — G8929 Other chronic pain: Secondary | ICD-10-CM | POA: Diagnosis not present

## 2020-11-15 HISTORY — DX: Atherosclerotic heart disease of native coronary artery without angina pectoris: I25.10

## 2020-11-15 SURGERY — MAMMOPLASTY, REDUCTION
Anesthesia: General | Site: Breast | Laterality: Bilateral

## 2020-11-25 ENCOUNTER — Ambulatory Visit: Payer: Medicaid Other | Admitting: Family Medicine

## 2020-12-10 ENCOUNTER — Ambulatory Visit (HOSPITAL_BASED_OUTPATIENT_CLINIC_OR_DEPARTMENT_OTHER): Admission: RE | Admit: 2020-12-10 | Payer: Medicaid Other | Source: Home / Self Care | Admitting: Plastic Surgery

## 2020-12-10 ENCOUNTER — Telehealth: Payer: Self-pay | Admitting: Family

## 2020-12-10 ENCOUNTER — Encounter (HOSPITAL_BASED_OUTPATIENT_CLINIC_OR_DEPARTMENT_OTHER): Admission: RE | Payer: Self-pay | Source: Home / Self Care

## 2020-12-10 SURGERY — MAMMOPLASTY, REDUCTION
Anesthesia: General | Site: Breast | Laterality: Bilateral

## 2020-12-10 NOTE — Telephone Encounter (Signed)
Pt called stating that her Vertigo is getting worse and says her medicine isnt working anymore and that its hard for her to even sit up at times.   Wanted me to send message to nurse to ask if Alyse Low could work her in to be seen sometime this week? Explained to pt that Christys schedule is full and that it is not a guarantee that we can work her in but that she would get a call from nurse about it.  Please advise and call patient.

## 2020-12-11 ENCOUNTER — Ambulatory Visit: Payer: Self-pay | Admitting: Family Medicine

## 2020-12-11 ENCOUNTER — Encounter: Payer: Self-pay | Admitting: Family
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
## 2020-12-19 ENCOUNTER — Other Ambulatory Visit: Payer: Self-pay

## 2020-12-19 ENCOUNTER — Telehealth: Payer: Medicaid Other | Admitting: Psychiatry

## 2020-12-19 ENCOUNTER — Ambulatory Visit: Payer: Medicaid Other | Admitting: Family Medicine

## 2020-12-20 ENCOUNTER — Encounter: Payer: Self-pay | Admitting: Family Medicine

## 2021-01-24 ENCOUNTER — Other Ambulatory Visit: Payer: Self-pay | Admitting: Family

## 2021-02-03 ENCOUNTER — Telehealth: Payer: Self-pay

## 2021-02-03 NOTE — Telephone Encounter (Signed)
received notice that a prior auth was needed for the latuda -

## 2021-02-03 NOTE — Telephone Encounter (Signed)
went online to [email or web address] and submitted the prior auth- it was approved until 02-03-2022

## 2021-03-06 ENCOUNTER — Ambulatory Visit: Payer: Medicaid Other | Admitting: Family

## 2021-03-12 ENCOUNTER — Encounter: Payer: Self-pay | Admitting: Family

## 2021-03-30 ENCOUNTER — Other Ambulatory Visit: Payer: Self-pay | Admitting: Family

## 2021-05-06 ENCOUNTER — Other Ambulatory Visit: Payer: Self-pay | Admitting: *Deleted

## 2021-05-06 MED ORDER — PRAZOSIN HCL 2 MG PO CAPS: 2.0000 mg | ORAL_CAPSULE | Freq: Every day | ORAL | 0 refills | Status: DC

## 2021-05-06 NOTE — Telephone Encounter (Signed)
Ordered a refill of prazosin per request. Please contact the patient, and make follow up appointment (for 30 mins) as I have not seen her for the last several months. I will not be able to do any more refills without evaluation.

## 2021-06-27 ENCOUNTER — Other Ambulatory Visit: Payer: Self-pay | Admitting: Psychiatry

## 2021-06-27 ENCOUNTER — Other Ambulatory Visit: Payer: Self-pay | Admitting: Family

## 2021-08-08 ENCOUNTER — Other Ambulatory Visit: Payer: Self-pay | Admitting: Family

## 2021-08-12 DIAGNOSIS — M62830 Muscle spasm of back: Secondary | ICD-10-CM | POA: Diagnosis not present

## 2021-08-13 DIAGNOSIS — R300 Dysuria: Secondary | ICD-10-CM | POA: Diagnosis not present

## 2021-08-13 DIAGNOSIS — Z113 Encounter for screening for infections with a predominantly sexual mode of transmission: Secondary | ICD-10-CM | POA: Diagnosis not present

## 2021-09-11 ENCOUNTER — Ambulatory Visit: Payer: Medicaid Other | Admitting: Cardiology
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
## 2021-09-16 ENCOUNTER — Ambulatory Visit: Payer: Medicaid Other | Admitting: Internal Medicine

## 2021-10-20 ENCOUNTER — Ambulatory Visit: Payer: Medicaid Other | Admitting: Internal Medicine
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
## 2021-11-17 IMAGING — RF DG C-ARM 1-60 MIN
1 series · 2 of 2 positions shown · non-contrast
Comparison: CT RIGHT ankle 11/10/2019

FLUOROSCOPY TIME:  0 minutes 33 seconds

Images: 2

Dose: 0.77 mGy

CLINICAL DATA: ORIF RIGHT ankle/foot

EXAM:
RIGHT ANKLE - 2 VIEW; DG C-ARM 1-60 MIN

[Series 1: run · 2 of 2 slices shown]
[im 1/2]
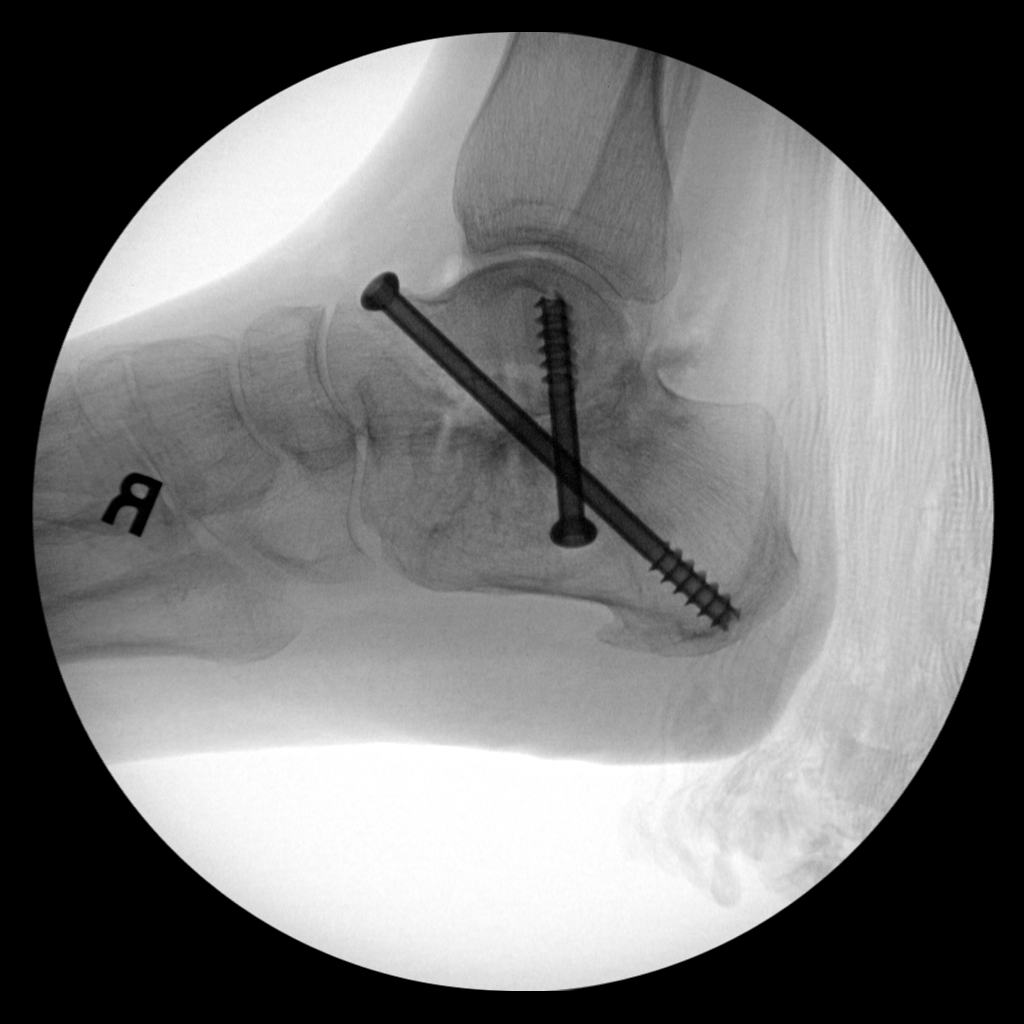
[im 2/2]
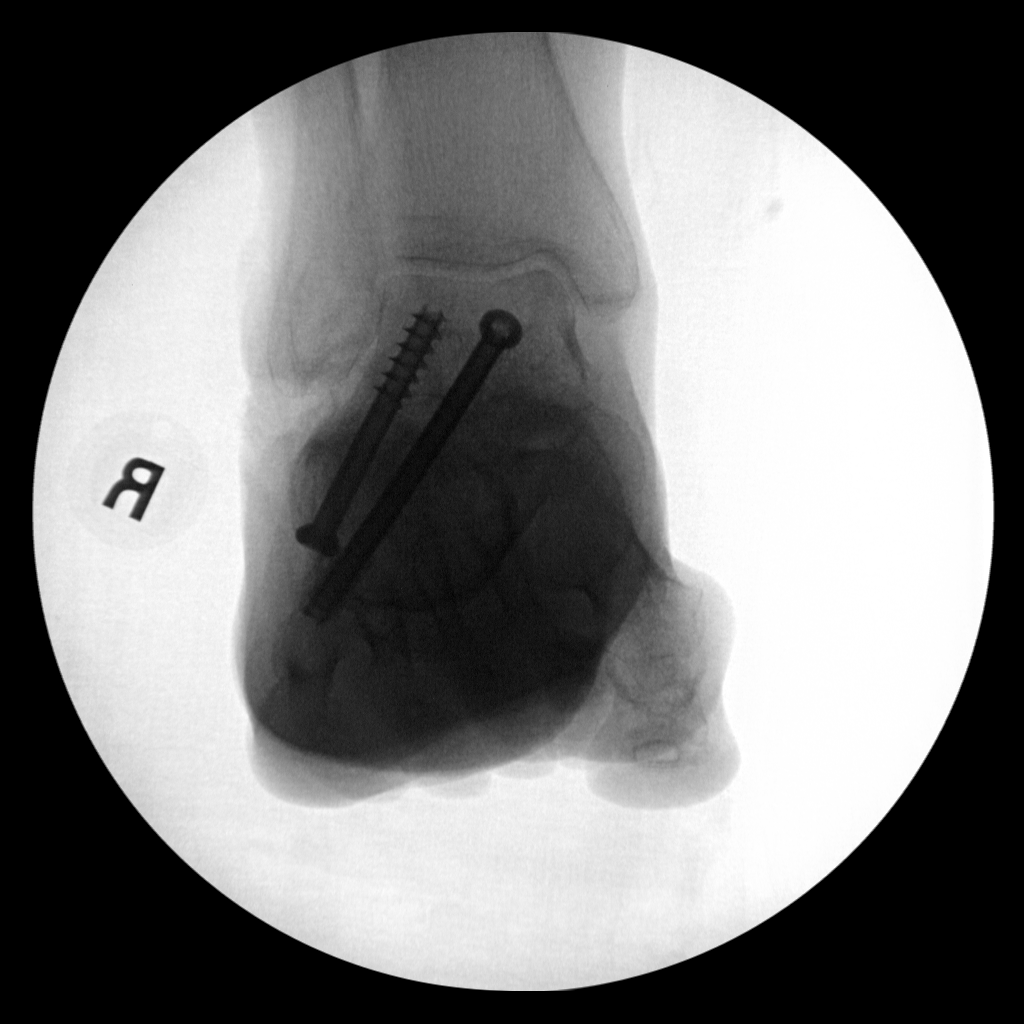

[2 of 2 positions shown; findings below may reference images not displayed]

FINDINGS: Osseous demineralization.

Moderate-sized plantar calcaneal spur.

Two cannulated screws are present post talocalcaneal fusion.

Ankle joint space preserved.
IMPRESSION: Post subtalar fusion.

## 2021-11-18 ENCOUNTER — Telehealth: Payer: Self-pay | Admitting: Cardiology

## 2021-11-18 NOTE — Telephone Encounter (Signed)
Patient said that her left arm is giving her a lot of pain. Pain goes down her elbow to finger tips. Takes two 400 mg ibuprofin to ease pain but did not do anything to ease pain. Please call to discuss with patient. ?

## 2021-11-18 NOTE — Telephone Encounter (Signed)
Attempted to call but bad number ?

## 2021-11-18 NOTE — Telephone Encounter (Signed)
Mychart message sent.

## 2021-11-22 DIAGNOSIS — S46912A Strain of unspecified muscle, fascia and tendon at shoulder and upper arm level, left arm, initial encounter: Secondary | ICD-10-CM | POA: Diagnosis not present

## 2021-11-22 DIAGNOSIS — S29011A Strain of muscle and tendon of front wall of thorax, initial encounter: Secondary | ICD-10-CM | POA: Diagnosis not present

## 2021-11-22 DIAGNOSIS — M19012 Primary osteoarthritis, left shoulder: Secondary | ICD-10-CM | POA: Diagnosis not present

## 2021-11-22 DIAGNOSIS — X500XXA Overexertion from strenuous movement or load, initial encounter: Secondary | ICD-10-CM | POA: Diagnosis not present

## 2021-11-24 DIAGNOSIS — M129 Arthropathy, unspecified: Secondary | ICD-10-CM | POA: Diagnosis not present

## 2021-11-24 DIAGNOSIS — R5383 Other fatigue: Secondary | ICD-10-CM | POA: Diagnosis not present

## 2021-11-24 DIAGNOSIS — Z79899 Other long term (current) drug therapy: Secondary | ICD-10-CM | POA: Diagnosis not present

## 2021-11-24 DIAGNOSIS — Z0000 Encounter for general adult medical examination without abnormal findings: Secondary | ICD-10-CM | POA: Diagnosis not present

## 2021-11-24 DIAGNOSIS — E559 Vitamin D deficiency, unspecified: Secondary | ICD-10-CM | POA: Diagnosis not present

## 2021-11-25 DIAGNOSIS — H5213 Myopia, bilateral: Secondary | ICD-10-CM | POA: Diagnosis not present

## 2021-12-02 DIAGNOSIS — S46012A Strain of muscle(s) and tendon(s) of the rotator cuff of left shoulder, initial encounter: Secondary | ICD-10-CM | POA: Diagnosis not present

## 2021-12-03 DIAGNOSIS — M25571 Pain in right ankle and joints of right foot: Secondary | ICD-10-CM | POA: Diagnosis not present

## 2021-12-05 ENCOUNTER — Ambulatory Visit: Payer: Medicaid Other | Admitting: Podiatry

## 2021-12-08 DIAGNOSIS — E7800 Pure hypercholesterolemia, unspecified: Secondary | ICD-10-CM | POA: Diagnosis not present

## 2021-12-08 DIAGNOSIS — F319 Bipolar disorder, unspecified: Secondary | ICD-10-CM | POA: Diagnosis not present

## 2021-12-08 DIAGNOSIS — M5441 Lumbago with sciatica, right side: Secondary | ICD-10-CM | POA: Diagnosis not present

## 2021-12-08 DIAGNOSIS — Z79899 Other long term (current) drug therapy: Secondary | ICD-10-CM | POA: Diagnosis not present

## 2021-12-08 DIAGNOSIS — E039 Hypothyroidism, unspecified: Secondary | ICD-10-CM | POA: Diagnosis not present

## 2021-12-10 DIAGNOSIS — H524 Presbyopia: Secondary | ICD-10-CM | POA: Diagnosis not present

## 2021-12-10 DIAGNOSIS — H52223 Regular astigmatism, bilateral: Secondary | ICD-10-CM | POA: Diagnosis not present

## 2021-12-20 DIAGNOSIS — M25512 Pain in left shoulder: Secondary | ICD-10-CM | POA: Diagnosis not present

## 2021-12-30 ENCOUNTER — Encounter: Payer: Medicaid Other | Admitting: Family

## 2022-01-13 ENCOUNTER — Ambulatory Visit: Payer: Medicaid Other | Admitting: Internal Medicine
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
## 2022-01-14 ENCOUNTER — Other Ambulatory Visit (HOSPITAL_COMMUNITY): Payer: Self-pay | Admitting: Orthopedic Surgery

## 2022-01-14 ENCOUNTER — Other Ambulatory Visit: Payer: Self-pay | Admitting: Orthopedic Surgery

## 2022-01-14 DIAGNOSIS — M25512 Pain in left shoulder: Secondary | ICD-10-CM

## 2022-01-30 ENCOUNTER — Telehealth: Payer: Self-pay | Admitting: Cardiology

## 2022-01-30 NOTE — Telephone Encounter (Signed)
  Patient Consent for Virtual Visit        Rachael Watson has provided verbal consent on 01/30/2022 for a virtual visit (video or telephone).   CONSENT FOR VIRTUAL VISIT FOR:  Rachael Watson  By participating in this virtual visit I agree to the following:  I hereby voluntarily request, consent and authorize [facility] and its employed or contracted physicians, physician assistants, nurse practitioners or other licensed health care professionals (the Practitioner), to provide me with telemedicine health care services (the "Services") as deemed necessary by the treating Practitioner. I acknowledge and consent to receive the Services by the Practitioner via telemedicine. I understand that the telemedicine visit will involve communicating with the Practitioner through live audiovisual communication technology and the disclosure of certain medical information by electronic transmission. I acknowledge that I have been given the opportunity to request an in-person assessment or other available alternative prior to the telemedicine visit and am voluntarily participating in the telemedicine visit.  I understand that I have the right to withhold or withdraw my consent to the use of telemedicine in the course of my care at any time, without affecting my right to future care or treatment, and that the Practitioner or I may terminate the telemedicine visit at any time. I understand that I have the right to inspect all information obtained and/or recorded in the course of the telemedicine visit and may receive copies of available information for a reasonable fee.  I understand that some of the potential risks of receiving the Services via telemedicine include:  Delay or interruption in medical evaluation due to technological equipment failure or disruption; Information transmitted may not be sufficient (e.g. poor resolution of images) to allow for appropriate medical decision making by the Practitioner; and/or   In rare instances, security protocols could fail, causing a breach of personal health information.  Furthermore, I acknowledge that it is my responsibility to provide information about my medical history, conditions and care that is complete and accurate to the best of my ability. I acknowledge that Practitioner's advice, recommendations, and/or decision may be based on factors not within their control, such as incomplete or inaccurate data provided by me or distortions of diagnostic images or specimens that may result from electronic transmissions. I understand that the practice of medicine is not an exact science and that Practitioner makes no warranties or guarantees regarding treatment outcomes. I acknowledge that a copy of this consent can be made available to me via my patient portal ([facility]), or I can request a printed copy by calling the office of [facility].    I understand that my insurance will be billed for this visit.   I have read or had this consent read to me. I understand the contents of this consent, which adequately explains the benefits and risks of the Services being provided via telemedicine.  I have been provided ample opportunity to ask questions regarding this consent and the Services and have had my questions answered to my satisfaction. I give my informed consent for the services to be provided through the use of telemedicine in my medical care   2

## 2022-02-02 ENCOUNTER — Encounter: Payer: Self-pay | Admitting: Cardiology

## 2022-02-02 ENCOUNTER — Ambulatory Visit (INDEPENDENT_AMBULATORY_CARE_PROVIDER_SITE_OTHER): Payer: Medicaid Other | Admitting: Cardiology

## 2022-02-02 VITALS — Ht 66.0 in | Wt 180.0 lb

## 2022-02-02 DIAGNOSIS — Z8679 Personal history of other diseases of the circulatory system: Secondary | ICD-10-CM | POA: Diagnosis not present

## 2022-02-02 DIAGNOSIS — R079 Chest pain, unspecified: Secondary | ICD-10-CM | POA: Diagnosis not present

## 2022-02-02 DIAGNOSIS — I6529 Occlusion and stenosis of unspecified carotid artery: Secondary | ICD-10-CM | POA: Diagnosis not present

## 2022-02-02 DIAGNOSIS — M79606 Pain in leg, unspecified: Secondary | ICD-10-CM

## 2022-02-02 DIAGNOSIS — E782 Mixed hyperlipidemia: Secondary | ICD-10-CM
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
## 2022-02-02 NOTE — Addendum Note (Signed)
Addended by: Laurine Blazer on: 02/02/2022 09:40 AM   Modules accepted: Orders

## 2022-02-02 NOTE — Patient Instructions (Signed)
Medication Instructions:  Continue all current medications.  Labwork: none  Testing/Procedures: Your physician has requested that you have a carotid duplex. This test is an ultrasound of the carotid arteries in your neck. It looks at blood flow through these arteries that supply the brain with blood. Allow one hour for this exam. There are no restrictions or special instructions.  Your physician has requested that you have an ankle brachial index (ABI). During this test an ultrasound and blood pressure cuff are used to evaluate the arteries that supply the arms and legs with blood. Allow thirty minutes for this exam. There are no restrictions or special instructions.  Office will contact with results via phone, letter or mychart.     Follow-Up: 6 months   Any Other Special Instructions Will Be Listed Below (If Applicable).   If you need a refill on your cardiac medications before your next appointment, please call your pharmacy.

## 2022-02-18 ENCOUNTER — Encounter (HOSPITAL_COMMUNITY): Payer: Self-pay

## 2022-02-18 ENCOUNTER — Ambulatory Visit (HOSPITAL_COMMUNITY): Payer: Medicaid Other | Attending: Orthopedic Surgery

## 2022-03-10 ENCOUNTER — Telehealth: Payer: Self-pay | Admitting: Cardiology

## 2022-03-10 DIAGNOSIS — E785 Hyperlipidemia, unspecified: Secondary | ICD-10-CM

## 2022-03-10 MED ORDER — ATORVASTATIN CALCIUM 40 MG PO TABS: 40.0000 mg | ORAL_TABLET | Freq: Every day | ORAL | 1 refills | Status: DC

## 2022-03-10 MED ORDER — FUROSEMIDE 40 MG PO TABS: ORAL_TABLET | ORAL | 1 refills | Status: DC

## 2022-03-10 MED ORDER — ISOSORBIDE MONONITRATE ER 30 MG PO TB24: ORAL_TABLET | ORAL | 1 refills | Status: DC

## 2022-03-10 MED ORDER — ASPIRIN 81 MG PO TBEC: 81.0000 mg | DELAYED_RELEASE_TABLET | Freq: Every day | ORAL | 1 refills | Status: DC

## 2022-03-10 NOTE — Telephone Encounter (Signed)
*  STAT* If patient is at the pharmacy, call can be transferred to refill team.   1. Which medications need to be refilled? (please list name of each medication and dose if known) isosorbide mononitrate (IMDUR) 30 MG 24 hr tablet furosemide (LASIX) 40 MG tablet atorvastatin (LIPITOR) 40 MG tablet aspirin 81 MG EC tablet  2. Which pharmacy/location (including street and city if local pharmacy) is medication to be sent to? [place], [place]  3. Do they need a 30 day or 90 day supply? 90  Pt is out of medication.  Requesting call back so she knows when it is called in.

## 2022-04-20 DIAGNOSIS — M5442 Lumbago with sciatica, left side: Secondary | ICD-10-CM | POA: Diagnosis not present

## 2022-04-20 DIAGNOSIS — G8929 Other chronic pain: Secondary | ICD-10-CM | POA: Diagnosis not present

## 2022-04-20 DIAGNOSIS — M5441 Lumbago with sciatica, right side: Secondary | ICD-10-CM | POA: Diagnosis not present

## 2022-04-20 DIAGNOSIS — Z79899 Other long term (current) drug therapy: Secondary | ICD-10-CM | POA: Diagnosis not present

## 2022-04-20 DIAGNOSIS — M25511 Pain in right shoulder: Secondary | ICD-10-CM | POA: Diagnosis not present

## 2022-04-20 DIAGNOSIS — M25512 Pain in left shoulder: Secondary | ICD-10-CM | POA: Diagnosis not present

## 2022-04-20 DIAGNOSIS — E7800 Pure hypercholesterolemia, unspecified: Secondary | ICD-10-CM | POA: Diagnosis not present

## 2022-04-20 DIAGNOSIS — F319 Bipolar disorder, unspecified: Secondary | ICD-10-CM | POA: Diagnosis not present

## 2022-04-20 DIAGNOSIS — E039 Hypothyroidism, unspecified: Secondary | ICD-10-CM | POA: Diagnosis not present

## 2022-06-03 ENCOUNTER — Ambulatory Visit: Payer: Medicaid Other | Attending: Cardiology

## 2022-06-03 DIAGNOSIS — I6523 Occlusion and stenosis of bilateral carotid arteries: Secondary | ICD-10-CM

## 2022-06-03 DIAGNOSIS — I6529 Occlusion and stenosis of unspecified carotid artery: Secondary | ICD-10-CM

## 2022-06-04 ENCOUNTER — Ambulatory Visit: Payer: Medicaid Other | Admitting: Podiatry

## 2022-06-09 ENCOUNTER — Ambulatory Visit (INDEPENDENT_AMBULATORY_CARE_PROVIDER_SITE_OTHER): Payer: Medicaid Other | Admitting: Podiatry

## 2022-06-09 DIAGNOSIS — M79675 Pain in left toe(s): Secondary | ICD-10-CM | POA: Diagnosis not present

## 2022-06-09 DIAGNOSIS — B351 Tinea unguium: Secondary | ICD-10-CM

## 2022-06-09 DIAGNOSIS — E1140 Type 2 diabetes mellitus with diabetic neuropathy, unspecified: Secondary | ICD-10-CM | POA: Diagnosis not present

## 2022-06-09 DIAGNOSIS — I509 Heart failure, unspecified: Secondary | ICD-10-CM | POA: Diagnosis not present

## 2022-06-09 DIAGNOSIS — M79674 Pain in right toe(s): Secondary | ICD-10-CM | POA: Diagnosis not present

## 2022-06-09 DIAGNOSIS — E1149 Type 2 diabetes mellitus with other diabetic neurological complication: Secondary | ICD-10-CM

## 2022-06-09 DIAGNOSIS — K219 Gastro-esophageal reflux disease without esophagitis: Secondary | ICD-10-CM | POA: Diagnosis not present

## 2022-06-09 DIAGNOSIS — J449 Chronic obstructive pulmonary disease, unspecified: Secondary | ICD-10-CM | POA: Diagnosis not present

## 2022-06-09 MED ORDER — CICLOPIROX 8 % EX SOLN: Freq: Every day | CUTANEOUS | 0 refills | Status: DC

## 2022-06-09 MED ORDER — AMMONIUM LACTATE 12 % EX LOTN: 1.0000 | TOPICAL_LOTION | CUTANEOUS | 0 refills | Status: DC | PRN
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
## 2022-06-11 DIAGNOSIS — M25511 Pain in right shoulder: Secondary | ICD-10-CM | POA: Diagnosis not present

## 2022-06-11 DIAGNOSIS — G8929 Other chronic pain: Secondary | ICD-10-CM | POA: Diagnosis not present

## 2022-06-11 DIAGNOSIS — M79605 Pain in left leg: Secondary | ICD-10-CM | POA: Diagnosis not present

## 2022-06-11 DIAGNOSIS — M25512 Pain in left shoulder: Secondary | ICD-10-CM | POA: Diagnosis not present

## 2022-06-11 DIAGNOSIS — Z79899 Other long term (current) drug therapy: Secondary | ICD-10-CM | POA: Diagnosis not present

## 2022-06-11 DIAGNOSIS — E7800 Pure hypercholesterolemia, unspecified: Secondary | ICD-10-CM | POA: Diagnosis not present

## 2022-06-11 DIAGNOSIS — M79604 Pain in right leg: Secondary | ICD-10-CM | POA: Diagnosis not present

## 2022-06-11 DIAGNOSIS — M5441 Lumbago with sciatica, right side: Secondary | ICD-10-CM | POA: Diagnosis not present

## 2022-06-11 DIAGNOSIS — H9221 Otorrhagia, right ear: Secondary | ICD-10-CM | POA: Diagnosis not present

## 2022-06-11 DIAGNOSIS — F319 Bipolar disorder, unspecified: Secondary | ICD-10-CM | POA: Diagnosis not present

## 2022-06-11 DIAGNOSIS — M5442 Lumbago with sciatica, left side: Secondary | ICD-10-CM | POA: Diagnosis not present

## 2022-06-11 DIAGNOSIS — F411 Generalized anxiety disorder: Secondary | ICD-10-CM | POA: Diagnosis not present

## 2022-06-12 ENCOUNTER — Telehealth: Payer: Self-pay | Admitting: *Deleted

## 2022-06-12 ENCOUNTER — Telehealth: Payer: Self-pay | Admitting: Internal Medicine

## 2022-06-12 NOTE — Telephone Encounter (Signed)
-----   Message from Arnoldo Lenis, MD sent at 06/12/2022 12:47 PM EST ----- Carotid US shows moderate blockages on the right, mild on the left. Just something to continue to monitor at this time  Zandra Abts MD

## 2022-06-12 NOTE — Telephone Encounter (Signed)
Laurine Blazer, LPN 06/0/0459  9:77 PM EST Back to Top    Notified, copy to pcp.

## 2022-06-12 NOTE — Telephone Encounter (Addendum)
Called and spoke to patient. She stated that she mainly wanted to discuss a plan to help her stop smoking and states she has been wheezing and having increased SOB since she has been smoking more. Wanted an appt to discuss plan/ medication to be prescribed to help her stop smoking. Patient has patches and lozenges she is going to try until appt.  Scheduled her for an appt to see an NP in [place] next available 12/29. Nothing further needed

## 2022-06-26 ENCOUNTER — Ambulatory Visit (HOSPITAL_COMMUNITY): Payer: Medicaid Other

## 2022-07-02 DIAGNOSIS — H11153 Pinguecula, bilateral: Secondary | ICD-10-CM | POA: Diagnosis not present

## 2022-07-02 DIAGNOSIS — H01002 Unspecified blepharitis right lower eyelid: Secondary | ICD-10-CM | POA: Diagnosis not present

## 2022-07-02 DIAGNOSIS — H01005 Unspecified blepharitis left lower eyelid: Secondary | ICD-10-CM | POA: Diagnosis not present

## 2022-07-02 DIAGNOSIS — H01001 Unspecified blepharitis right upper eyelid: Secondary | ICD-10-CM | POA: Diagnosis not present

## 2022-07-02 DIAGNOSIS — H02831 Dermatochalasis of right upper eyelid: Secondary | ICD-10-CM | POA: Diagnosis not present

## 2022-07-02 DIAGNOSIS — H16223 Keratoconjunctivitis sicca, not specified as Sjogren's, bilateral: Secondary | ICD-10-CM | POA: Diagnosis not present

## 2022-07-02 DIAGNOSIS — H25093 Other age-related incipient cataract, bilateral: Secondary | ICD-10-CM | POA: Diagnosis not present

## 2022-07-02 DIAGNOSIS — H02834 Dermatochalasis of left upper eyelid: Secondary | ICD-10-CM | POA: Diagnosis not present

## 2022-07-02 DIAGNOSIS — H01004 Unspecified blepharitis left upper eyelid: Secondary | ICD-10-CM | POA: Diagnosis not present

## 2022-07-03 ENCOUNTER — Encounter: Payer: Self-pay | Admitting: Nurse Practitioner

## 2022-07-03 ENCOUNTER — Ambulatory Visit (INDEPENDENT_AMBULATORY_CARE_PROVIDER_SITE_OTHER): Payer: Medicaid Other | Admitting: Nurse Practitioner

## 2022-07-03 VITALS — BP 120/65 | HR 62 | Ht 66.0 in | Wt 177.2 lb

## 2022-07-03 DIAGNOSIS — Z720 Tobacco use: Secondary | ICD-10-CM

## 2022-07-03 DIAGNOSIS — R0609 Other forms of dyspnea: Secondary | ICD-10-CM | POA: Diagnosis not present

## 2022-07-03 DIAGNOSIS — K219 Gastro-esophageal reflux disease without esophagitis: Secondary | ICD-10-CM

## 2022-07-03 DIAGNOSIS — J449 Chronic obstructive pulmonary disease, unspecified: Secondary | ICD-10-CM | POA: Diagnosis not present

## 2022-07-03 MED ORDER — VARENICLINE TARTRATE 0.5 MG PO TABS: ORAL_TABLET | ORAL | 0 refills | Status: DC

## 2022-07-03 MED ORDER — BREZTRI AEROSPHERE 160-9-4.8 MCG/ACT IN AERO: 2.0000 | INHALATION_SPRAY | Freq: Two times a day (BID) | RESPIRATORY_TRACT | 0 refills | Status: AC

## 2022-07-03 MED ORDER — PANTOPRAZOLE SODIUM 40 MG PO TBEC: 40.0000 mg | DELAYED_RELEASE_TABLET | Freq: Every day | ORAL | 5 refills | Status: DC

## 2022-07-03 MED ORDER — VARENICLINE TARTRATE 1 MG PO TABS: 1.0000 mg | ORAL_TABLET | Freq: Two times a day (BID) | ORAL | 0 refills | Status: DC

## 2022-07-03 NOTE — Assessment & Plan Note (Addendum)
She has a history of normal PFTs from 2021. Progressive DOE with wheezing and chest heaviness/tightness. Possible asthma overlap. We will repeat PFTs for further evaluation. She is also being evaluated by cardiology. Trial step up to [employer]. We discussed the importance of her quitting smoking. COPD/asthma action plan in place.  Patient Instructions  Stop Symbicort. Trial Breztri 2 puffs Twice daily. Brush tongue and rinse mouth afterwards. Let me know if you feel like this helps your breathing and I will send in a prescription; otherwise, you can go back to using your Symbicort Continue Albuterol inhaler 2 puffs every 6 hours as needed for shortness of breath or wheezing.  Stop omeprazole  Protonix (pantoprazole) 1 tab 30 min prior to breakfast for reflux Famotidine 20 mg At bedtime for reflux  Quit date for smoking: 07/06/2022  Referral to lung cancer screening program  Repeat pulmonary function testing   Follow up after pulmonary function testing with Dr. Melvyn Novas or Alanson Aly. If symptoms do not improve or worsen, please contact office for sooner follow up or seek emergency care.

## 2022-07-03 NOTE — Progress Notes (Signed)
$'@Patient'x$  ID: Rachael Watson, female    DOB: [birth year], 56 y.o.   MRN: [id number]  Chief Complaint  Patient presents with   Follow-up    Referring provider: Sandi Mariscal, MD  HPI: 56 year old female, active smoker followed for COPD Gold 0.  She is a  patient of Dr. Gustavus Bryant and last seen via virtual visit 06/07/2020.  Past medical history significant for GERD, hypothyroidism, depression, anxiety.  TEST/EVENTS:  03/14/2020 PFT: FVC 76, FEV1 78, ratio 80, DLCO 63. No BD  07/03/2022: Today-follow-up Patient presents today with her father, who is also one of her patients, for overdue follow-up.  She has been having some more trouble with her breathing over the past few months.  She does not feel limited in daily activities by her breathing.  Notices it with uphill climbing or after smoking.  She also feels like she is getting more chest heaviness and wheezing, especially with heavy smoking.  She has a chronic cough which is primarily nonproductive.  She has voice hoarseness, which has been an ongoing problem for her.  She is planning to see ENT for evaluation of this.  She does have issues with reflux, which she takes omeprazole for. Doesn't feel like this always works for her. She denies any difficulties swallowing, nausea, abdominal discomfort. She denies any hemoptysis, fevers, chills, night sweats, orthopnea, lower extremity swelling.  She is currently on Symbicort, which she uses twice daily. She is smoking around a pack a day.   Allergies  Allergen Reactions   Abilify [Aripiprazole] Other (See Comments)    Violent behavior   Quetiapine Fumarate Other (See Comments)    Pt felt paralyzed    Risperidone Other (See Comments)    Unknown   Seroquel [Quetiapine] Other (See Comments)    Tremors and unable to swallow    Immunization History  Administered Date(s) Administered   Moderna Sars-Covid-2 Vaccination 12/05/2019, 01/04/2020    Past Medical History:  Diagnosis Date   Anxiety     Arthritis    Asthma    Chest pain, precordial 12/17/2017   CHF (congestive heart failure) ([place])    2009   COPD (chronic obstructive pulmonary disease) ([place])    Coronary artery disease    patient states history of blockage    Diabetes mellitus without complication ([place])     diet control 1 year ago   Fatty liver disease, nonalcoholic    GERD (gastroesophageal reflux disease)    Heart murmur    Pneumonia    Thyroid disease     Tobacco History: Social History   Tobacco Use  Smoking Status Every Day   Packs/day: 0.50   Years: 39.00   Total pack years: 19.50   Types: Cigarettes   Last attempt to quit: 09/03/2020   Years since quitting: 1.8  Smokeless Tobacco Former  Tobacco Comments   smokes 1/2 pack per day 07/03/22 [facility]   Ready to quit: Not Answered Counseling given: Not Answered Tobacco comments: smokes 1/2 pack per day 07/03/22 [facility]   Outpatient Medications Prior to Visit  Medication Sig Dispense Refill   budesonide-formoterol (SYMBICORT) 160-4.5 MCG/ACT inhaler Inhale 2 puffs into the lungs in the morning and at bedtime. 1 each 11   PROAIR HFA 108 (90 Base) MCG/ACT inhaler INHALE 2 PUFFS BY MOUTH EVERY 6 HOURS AS NEEDED FOR WHEEZING FOR SHORTNESS OF BREATH 9 g 2   ammonium lactate (AMLACTIN) 12 % lotion Apply 1 Application topically as needed for dry skin. 400 g 0   aspirin  EC 81 MG tablet Take 1 tablet (81 mg total) by mouth daily. 90 tablet 1   atorvastatin (LIPITOR) 40 MG tablet Take 1 tablet (40 mg total) by mouth daily. 90 tablet 1   Calcium Carb-Cholecalciferol (CALCIUM 1000 + D PO) Take 1 tablet by mouth daily.     Cholecalciferol (VITAMIN D) 50 MCG (2000 UT) tablet Take 2,000 Units by mouth daily.     ciclopirox (PENLAC) 8 % solution Apply topically at bedtime. Apply over nail and surrounding skin. Apply daily over previous coat. After seven (7) days, may remove with alcohol and continue cycle. 6.6 mL 0   cycloSPORINE (RESTASIS) 0.05 % ophthalmic emulsion Place 1 drop  into both eyes 2 (two) times daily. 30 each 2   furosemide (LASIX) 40 MG tablet TAKE 1 TABLET BY MOUTH ONCE A DAY. MAY TAKE AN ADDITIONAL 1/2 TO 1 TABLET AS NEEDED FOR SWELLING. 90 tablet 1   gabapentin (NEURONTIN) 300 MG capsule Take 1 capsule (300 mg total) by mouth 3 (three) times daily. (Patient taking differently: Take 300 mg by mouth 3 (three) times daily as needed (pain).) 90 capsule 1   hydrOXYzine (ATARAX/VISTARIL) 10 MG tablet Take 1 tablet by mouth three times daily as needed 90 tablet 0   isosorbide mononitrate (IMDUR) 30 MG 24 hr tablet TAKE 1/2 (ONE-HALF) TABLET BY MOUTH ONCE DAILY PATIENT  NEEDS  TO  SEE  CARDIOLOGIST  FOR  FURTHER  REFILLS 45 tablet 1   Levothyroxine Sodium (LEVOTHROID PO) Take by mouth.     LINZESS 290 MCG CAPS capsule TAKE 1 CAPSULE BY MOUTH ONCE DAILY BEFORE BREAKFAST 90 capsule 0   meclizine (ANTIVERT) 25 MG tablet TAKE 1 TABLET BY MOUTH THREE TIMES DAILY AS NEEDED FOR  DIZZINESS 120 tablet 0   oxyCODONE-acetaminophen (PERCOCET) 10-325 MG tablet Take 1 tablet by mouth every 6 (six) hours as needed for pain.     prazosin (MINIPRESS) 2 MG capsule Take 1 capsule (2 mg total) by mouth at bedtime. 90 capsule 0   DULoxetine (CYMBALTA) 30 MG capsule Take 1 capsule (30 mg total) by mouth daily. (Patient not taking: Reported on 02/02/2022) 90 capsule 0   lurasidone (LATUDA) 40 MG TABS tablet Take 1 tablet (40 mg total) by mouth daily with breakfast. (Patient not taking: Reported on 02/02/2022) 90 tablet 1   Multiple Vitamins-Minerals (MULTIVITAMIN WITH MINERALS) tablet Take 1 tablet by mouth daily. (Patient not taking: Reported on 02/02/2022)     omeprazole (PRILOSEC) 40 MG capsule Take 1 capsule (40 mg total) by mouth daily. (Needs to be seen before next refill) 30 capsule 0   zolpidem (AMBIEN) 5 MG tablet Take 1 tablet (5 mg total) by mouth at bedtime as needed for sleep. (Patient not taking: Reported on 02/02/2022) 30 tablet 1   No facility-administered medications prior to  visit.     Review of Systems:   Constitutional: No weight loss or gain, night sweats, fevers, chills, fatigue, or lassitude. HEENT: No headaches, difficulty swallowing, tooth/dental problems, or sore throat. No sneezing, itching, ear ache, nasal congestion, or post nasal drip CV:  No chest pain, orthopnea, PND, swelling in lower extremities, anasarca, dizziness, palpitations, syncope Resp: +shortness of breath with exertion; wheezing; chest heaviness/tightness; chronic cough. No excess mucus or change in color of mucus. No hemoptysis. No chest wall deformity GI:  +heartburn, indigestion. No abdominal pain, nausea, vomiting, diarrhea, change in bowel habits, loss of appetite, bloody stools.  GU: No dysuria, change in color of urine, urgency or  frequency.  Skin: No rash, lesions, ulcerations MSK:  No joint pain or swelling.   Neuro: No dizziness or lightheadedness.  Psych: No SI/HI. Mood stable.     Physical Exam:  BP 120/65 (BP Location: Right Arm)   Pulse 62   Ht '5\' 6"'$  (1.676 m)   Wt 177 lb 3.2 oz (80.4 kg)   SpO2 96%   BMI 28.60 kg/m   GEN: Pleasant, interactive, well-appearing; in no acute distress. HEENT:  Normocephalic and atraumatic. PERRLA. Sclera white. Nasal turbinates pink, moist and patent bilaterally. No rhinorrhea present. Oropharynx erythematous and moist, without exudate or edema. No lesions, ulcerations NECK:  Supple w/ fair ROM. No JVD present. Normal carotid impulses w/o bruits. Thyroid symmetrical with no goiter or nodules palpated. No lymphadenopathy.   CV: RRR, no m/r/g, no peripheral edema. Pulses intact, +2 bilaterally. No cyanosis, pallor or clubbing. PULMONARY:  Unlabored, regular breathing. Clear bilaterally A&P w/o wheezes/rales/rhonchi. No accessory muscle use.  GI: BS present and normoactive. Soft, non-tender to palpation. No organomegaly or masses detected.  MSK: No erythema, warmth or tenderness. Cap refil <2 sec all extrem. No deformities or joint  swelling noted.  Neuro: A/Ox3. No focal deficits noted.   Skin: Warm, no lesions or rashe Psych: Normal affect and behavior. Judgement and thought content appropriate.     Lab Results:  CBC    Component Value Date/Time   WBC 10.7 (H) 01/19/2020 0934   RBC 4.44 01/19/2020 0934   HGB 12.7 01/19/2020 0934   HGB 13.7 12/29/2019 1223   HCT 40.1 01/19/2020 0934   HCT 40.7 12/29/2019 1223   PLT 259 01/19/2020 0934   PLT 378 12/29/2019 1223   MCV 90.3 01/19/2020 0934   MCV 89 12/29/2019 1223   MCH 28.6 01/19/2020 0934   MCHC 31.7 01/19/2020 0934   RDW 13.5 01/19/2020 0934   RDW 13.1 12/29/2019 1223   LYMPHSABS 4.2 (H) 12/29/2019 1223   MONOABS 0.7 11/25/2017 1517   EOSABS 0.2 12/29/2019 1223   BASOSABS 0.1 12/29/2019 1223    BMET    Component Value Date/Time   NA 138 01/19/2020 0934   NA 137 12/29/2019 1223   K 4.5 01/19/2020 0934   CL 107 01/19/2020 0934   CO2 23 01/19/2020 0934   GLUCOSE 83 01/19/2020 0934   BUN 14 01/19/2020 0934   BUN 14 12/29/2019 1223   CREATININE 0.88 01/19/2020 0934   CALCIUM 9.1 01/19/2020 0934   GFRNONAA >60 01/19/2020 0934   GFRAA >60 01/19/2020 0934    BNP No results found for: "BNP"   Imaging:  No results found.       Latest Ref Rng & Units 03/14/2020   12:59 PM  PFT Results  FVC-Pre L 2.82   FVC-Predicted Pre % 76   FVC-Post L 2.94   FVC-Predicted Post % 79   Pre FEV1/FVC % % 81   Post FEV1/FCV % % 80   FEV1-Pre L 2.28   FEV1-Predicted Pre % 78   FEV1-Post L 2.34   DLCO uncorrected ml/min/mmHg 14.17   DLCO [facility]% % 63   DLVA Predicted % 74     No results found for: "NITRICOXIDE"      Assessment & Plan:   COPD GOLD 0/ active  smoking with AB She has a history of normal PFTs from 2021. Progressive DOE with wheezing and chest heaviness/tightness. Possible asthma overlap. We will repeat PFTs for further evaluation. She is also being evaluated by cardiology. Trial step up to [employer]. We  discussed the importance of her  quitting smoking. COPD/asthma action plan in place.  Patient Instructions  Stop Symbicort. Trial Breztri 2 puffs Twice daily. Brush tongue and rinse mouth afterwards. Let me know if you feel like this helps your breathing and I will send in a prescription; otherwise, you can go back to using your Symbicort Continue Albuterol inhaler 2 puffs every 6 hours as needed for shortness of breath or wheezing.  Stop omeprazole  Protonix (pantoprazole) 1 tab 30 min prior to breakfast for reflux Famotidine 20 mg At bedtime for reflux  Quit date for smoking: 07/06/2022  Referral to lung cancer screening program  Repeat pulmonary function testing   Follow up after pulmonary function testing with Dr. Melvyn Novas or Alanson Aly. If symptoms do not improve or worsen, please contact office for sooner follow up or seek emergency care.    Tobacco abuse She would like to try Chantix; initially reviewed her mental health status and deemed as a potential option given stability. However, upon further review, this was discontinued as it looks like cardiology is treating her for CAD. Would be hesitant to use Wellbutrin with mental health history. I will send a referral to smoking cessation pharmacy team.  The patient's current tobacco use: 1 ppd The patient was advised to quit and impact of smoking on their health.  I assessed the patient's willingness to attempt to quit. I provided methods and skills for cessation. We reviewed medication management of smoking session drugs if appropriate. Resources to help quit smoking were provided. A smoking cessation quit date was set: 07/06/2022 Follow-up was arranged in our clinic.  The amount of time spent counseling patient was 5 mins    GERD (gastroesophageal reflux disease) She has voice hoarseness and chronic cough, likely multifactorial but suspect GERD is playing a role. We will change her to protonix and add famotidine for breakthrough at bedtime. Reviewed GERD diet and  elevating HOB at night. Avoid eating 2-3 hours prior to laying down. If symptoms persist, may consider referral to GI.   I spent 35 minutes of dedicated to the care of this patient on the date of this encounter to include pre-visit review of records, face-to-face time with the patient discussing conditions above, post visit ordering of testing, clinical documentation with the electronic health record, making appropriate referrals as documented, and communicating necessary findings to members of the patients care team.  Clayton Bibles, NP 07/03/2022  Pt aware and understands NP's role.

## 2022-07-03 NOTE — Assessment & Plan Note (Addendum)
She would like to try Chantix; initially reviewed her mental health status and deemed as a potential option given stability. However, upon further review, this was discontinued as it looks like cardiology is treating her for CAD. Would be hesitant to use Wellbutrin with mental health history. I will send a referral to smoking cessation pharmacy team.  The patient's current tobacco use: 1 ppd The patient was advised to quit and impact of smoking on their health.  I assessed the patient's willingness to attempt to quit. I provided methods and skills for cessation. We reviewed medication management of smoking session drugs if appropriate. Resources to help quit smoking were provided. A smoking cessation quit date was set: 07/06/2022 Follow-up was arranged in our clinic.  The amount of time spent counseling patient was 5 mins

## 2022-07-03 NOTE — Assessment & Plan Note (Signed)
She has voice hoarseness and chronic cough, likely multifactorial but suspect GERD is playing a role. We will change her to protonix and add famotidine for breakthrough at bedtime. Reviewed GERD diet and elevating HOB at night. Avoid eating 2-3 hours prior to laying down. If symptoms persist, may consider referral to GI.

## 2022-07-03 NOTE — Patient Instructions (Addendum)
Stop Symbicort. Trial Breztri 2 puffs Twice daily. Brush tongue and rinse mouth afterwards. Let me know if you feel like this helps your breathing and I will send in a prescription; otherwise, you can go back to using your Symbicort Continue Albuterol inhaler 2 puffs every 6 hours as needed for shortness of breath or wheezing.  Stop omeprazole  Protonix (pantoprazole) 1 tab 30 min prior to breakfast for reflux Famotidine 20 mg At bedtime for reflux  Quit date for smoking: 07/06/2022  Referral to lung cancer screening program  Repeat pulmonary function testing   Follow up after pulmonary function testing with Dr. Melvyn Novas or Alanson Aly. If symptoms do not improve or worsen, please contact office for sooner follow up or seek emergency care.

## 2022-07-10 DIAGNOSIS — M25511 Pain in right shoulder: Secondary | ICD-10-CM | POA: Diagnosis not present

## 2022-07-16 ENCOUNTER — Telehealth: Payer: Self-pay | Admitting: Cardiology

## 2022-07-16 DIAGNOSIS — I959 Hypotension, unspecified: Secondary | ICD-10-CM | POA: Diagnosis not present

## 2022-07-16 DIAGNOSIS — R457 State of emotional shock and stress, unspecified: Secondary | ICD-10-CM | POA: Diagnosis not present

## 2022-07-16 NOTE — Telephone Encounter (Signed)
Advised patient be evaluated in the ED - she verbalized understanding.

## 2022-07-16 NOTE — Telephone Encounter (Signed)
  Pt c/o BP issue: STAT if pt c/o blurred vision, one-sided weakness or slurred speech  1. What are your last 5 BP readings?  85/55 84/59  - 3:14 pm  95/51 76/47 HR 61 right now   2. Are you having any other symptoms (ex. Dizziness, headache, blurred vision, passed out)?headache and feel like going to passed out   3. What is your BP issue? Pt said, she's been feeling funny, she has really bad headache like her brain is going to puke, she feels like she's going to pass out at the doctor's office today

## 2022-07-20 ENCOUNTER — Ambulatory Visit: Payer: Medicaid Other | Admitting: Nurse Practitioner

## 2022-07-20 ENCOUNTER — Other Ambulatory Visit: Payer: Self-pay | Admitting: Cardiology

## 2022-07-20 DIAGNOSIS — I6523 Occlusion and stenosis of bilateral carotid arteries: Secondary | ICD-10-CM
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
## 2022-07-21 ENCOUNTER — Encounter (INDEPENDENT_AMBULATORY_CARE_PROVIDER_SITE_OTHER): Payer: Self-pay | Admitting: *Deleted

## 2022-07-27 DIAGNOSIS — F3113 Bipolar disorder, current episode manic without psychotic features, severe: Secondary | ICD-10-CM | POA: Diagnosis not present

## 2022-07-27 DIAGNOSIS — F19130 Other psychoactive substance abuse with withdrawal, uncomplicated: Secondary | ICD-10-CM | POA: Diagnosis not present

## 2022-07-27 DIAGNOSIS — F4310 Post-traumatic stress disorder, unspecified: Secondary | ICD-10-CM | POA: Diagnosis not present

## 2022-07-27 DIAGNOSIS — F603 Borderline personality disorder: Secondary | ICD-10-CM | POA: Diagnosis not present

## 2022-07-28 ENCOUNTER — Telehealth: Payer: Self-pay | Admitting: Podiatry

## 2022-07-28 NOTE — Telephone Encounter (Signed)
Looking at the patients chart I don't see where you prescribed the Lyrica medication. Patient states that she is no longer seeing [facility] as her primary care as well.

## 2022-07-28 NOTE — Telephone Encounter (Signed)
Pt requested a refill on her Rx for Lyrica. Please advise

## 2022-07-29 DIAGNOSIS — F3113 Bipolar disorder, current episode manic without psychotic features, severe: Secondary | ICD-10-CM | POA: Diagnosis not present

## 2022-07-29 DIAGNOSIS — F4310 Post-traumatic stress disorder, unspecified: Secondary | ICD-10-CM | POA: Diagnosis not present

## 2022-07-29 DIAGNOSIS — F19130 Other psychoactive substance abuse with withdrawal, uncomplicated: Secondary | ICD-10-CM | POA: Diagnosis not present

## 2022-07-29 DIAGNOSIS — F603 Borderline personality disorder: Secondary | ICD-10-CM | POA: Diagnosis not present

## 2022-07-31 NOTE — Telephone Encounter (Signed)
Left a detailed message for the patient to call us back with Dosage and instructions for her Lyrica.

## 2022-08-05 ENCOUNTER — Ambulatory Visit: Payer: Medicaid Other | Admitting: Nurse Practitioner

## 2022-08-05 ENCOUNTER — Other Ambulatory Visit: Payer: Self-pay | Admitting: Cardiology

## 2022-08-05 DIAGNOSIS — E785 Hyperlipidemia, unspecified: Secondary | ICD-10-CM

## 2022-08-05 DIAGNOSIS — F902 Attention-deficit hyperactivity disorder, combined type: Secondary | ICD-10-CM | POA: Diagnosis not present

## 2022-08-05 DIAGNOSIS — F4312 Post-traumatic stress disorder, chronic: Secondary | ICD-10-CM | POA: Diagnosis not present

## 2022-08-06 ENCOUNTER — Ambulatory Visit: Payer: Medicaid Other | Admitting: Cardiology

## 2022-08-06 ENCOUNTER — Encounter: Payer: Self-pay | Admitting: *Deleted

## 2022-08-07 ENCOUNTER — Ambulatory Visit: Payer: Medicaid Other | Admitting: Nurse Practitioner

## 2022-08-07 DIAGNOSIS — F902 Attention-deficit hyperactivity disorder, combined type: Secondary | ICD-10-CM | POA: Diagnosis not present

## 2022-08-07 DIAGNOSIS — F4312 Post-traumatic stress disorder, chronic: Secondary | ICD-10-CM | POA: Diagnosis not present

## 2022-08-11 ENCOUNTER — Telehealth: Payer: Self-pay | Admitting: Podiatry

## 2022-08-11 ENCOUNTER — Encounter: Payer: Self-pay | Admitting: Podiatry

## 2022-08-11 DIAGNOSIS — F4312 Post-traumatic stress disorder, chronic: Secondary | ICD-10-CM | POA: Diagnosis not present

## 2022-08-11 DIAGNOSIS — F902 Attention-deficit hyperactivity disorder, combined type: Secondary | ICD-10-CM | POA: Diagnosis not present

## 2022-08-11 NOTE — Telephone Encounter (Signed)
Pt requested a Rx refill on Lyrica. Please advise

## 2022-08-12 DIAGNOSIS — Z87891 Personal history of nicotine dependence: Secondary | ICD-10-CM | POA: Diagnosis not present

## 2022-08-14 ENCOUNTER — Telehealth: Payer: Self-pay | Admitting: Family

## 2022-08-14 ENCOUNTER — Encounter: Payer: Self-pay | Admitting: Family

## 2022-08-14 ENCOUNTER — Ambulatory Visit (INDEPENDENT_AMBULATORY_CARE_PROVIDER_SITE_OTHER): Payer: Medicaid Other | Admitting: Family

## 2022-08-14 VITALS — BP 103/62 | HR 73 | Temp 96.0°F | Ht 66.0 in | Wt 181.8 lb

## 2022-08-14 DIAGNOSIS — F17200 Nicotine dependence, unspecified, uncomplicated: Secondary | ICD-10-CM

## 2022-08-14 DIAGNOSIS — M159 Polyosteoarthritis, unspecified: Secondary | ICD-10-CM

## 2022-08-14 DIAGNOSIS — Z0001 Encounter for general adult medical examination with abnormal findings: Secondary | ICD-10-CM | POA: Diagnosis not present

## 2022-08-14 DIAGNOSIS — Z0000 Encounter for general adult medical examination without abnormal findings: Secondary | ICD-10-CM

## 2022-08-14 DIAGNOSIS — F411 Generalized anxiety disorder: Secondary | ICD-10-CM

## 2022-08-14 DIAGNOSIS — Z23 Encounter for immunization: Secondary | ICD-10-CM | POA: Diagnosis not present

## 2022-08-14 DIAGNOSIS — E039 Hypothyroidism, unspecified: Secondary | ICD-10-CM

## 2022-08-14 DIAGNOSIS — F321 Major depressive disorder, single episode, moderate: Secondary | ICD-10-CM | POA: Diagnosis not present

## 2022-08-14 DIAGNOSIS — M792 Neuralgia and neuritis, unspecified: Secondary | ICD-10-CM

## 2022-08-14 DIAGNOSIS — J449 Chronic obstructive pulmonary disease, unspecified: Secondary | ICD-10-CM | POA: Diagnosis not present

## 2022-08-14 DIAGNOSIS — F902 Attention-deficit hyperactivity disorder, combined type: Secondary | ICD-10-CM | POA: Diagnosis not present

## 2022-08-14 DIAGNOSIS — E663 Overweight: Secondary | ICD-10-CM

## 2022-08-14 DIAGNOSIS — Z720 Tobacco use: Secondary | ICD-10-CM

## 2022-08-14 DIAGNOSIS — K219 Gastro-esophageal reflux disease without esophagitis: Secondary | ICD-10-CM

## 2022-08-14 DIAGNOSIS — F4312 Post-traumatic stress disorder, chronic: Secondary | ICD-10-CM | POA: Diagnosis not present

## 2022-08-14 DIAGNOSIS — R7303 Prediabetes: Secondary | ICD-10-CM

## 2022-08-14 LAB — LIPID PANEL

## 2022-08-14 MED ORDER — LUMBAR BACK BRACE/SUPPORT PAD MISC: 1.0000 | Freq: Every day | 0 refills | Status: DC

## 2022-08-14 MED ORDER — DESVENLAFAXINE SUCCINATE ER 50 MG PO TB24: 50.0000 mg | ORAL_TABLET | Freq: Every day | ORAL | 0 refills | Status: DC

## 2022-08-14 MED ORDER — PREGABALIN 50 MG PO CAPS: 50.0000 mg | ORAL_CAPSULE | Freq: Three times a day (TID) | ORAL | 2 refills | Status: DC

## 2022-08-14 MED ORDER — TENS THERAPY REPLACE BACK PADS MISC: 1.0000 | Freq: Every day | 0 refills | Status: AC | PRN

## 2022-08-14 MED ORDER — TENS THERAPY REPLACE BACK PADS MISC: 1.0000 | Freq: Every day | 0 refills | Status: DC | PRN

## 2022-08-14 NOTE — Telephone Encounter (Signed)
Prescriptions sent to [facility].

## 2022-08-14 NOTE — Patient Instructions (Signed)

## 2022-08-14 NOTE — Progress Notes (Signed)
Subjective:    Patient ID: Rachael Watson, female    DOB: 27-Aug-1965, 57 y.o.   MRN: [id number]  Chief Complaint  Patient presents with   Medical Management of Chronic Issues    Pain management referral    Pt presents to the office today to reestablish care. She is followed by Rachael Watson twice a week and psychiatry for GAD, Depression, and ADHD.   She is followed by Cardiologists annually for hx of cardiomyopathy.   She has COPD and continues to smoke 1/2-2 packs a day. Reports mild SOB when she is having anxiety.    Has neuropathic pain in bilateral feet. Takes Lyrica 50 mg TID.   Has hx of prediabetes. Does not check glucose at home. Gastroesophageal Reflux She complains of belching, heartburn and a hoarse voice. This is a chronic problem. The current episode started more than 1 year ago. The problem occurs frequently. Risk factors include obesity. She has tried a PPI for the symptoms. The treatment provided moderate relief.  Thyroid Problem Presents for follow-up visit. Symptoms include anxiety, depressed mood and hoarse voice. Patient reports no constipation or diarrhea. The symptoms have been stable.  Arthritis Presents for follow-up visit. She complains of pain and stiffness. Affected locations include the neck, right knee, left hip, left ankle and right ankle. Her pain is at a severity of 8/10. Pertinent negatives include no diarrhea.  Back Pain This is a chronic problem. The current episode started more than 1 year ago. The problem occurs intermittently. The problem has been waxing and waning since onset. The pain is present in the lumbar spine and thoracic spine. The quality of the pain is described as aching. The pain is at a severity of 8/10. The pain is moderate. She has tried home exercises and NSAIDs for the symptoms. The treatment provided mild relief.  Anxiety Presents for follow-up visit. Symptoms include depressed mood, excessive worry, hyperventilation,  irritability, nervous/anxious behavior and restlessness.       Review of Systems  Constitutional:  Positive for irritability.  HENT:  Positive for hoarse voice.   Gastrointestinal:  Positive for heartburn. Negative for constipation and diarrhea.  Musculoskeletal:  Positive for arthritis, back pain and stiffness.  Psychiatric/Behavioral:  The patient is nervous/anxious.   All other systems reviewed and are negative.  Family History  Problem Relation Age of Onset   Heart attack Mother    Brain cancer Mother    Heart attack Rachael Watson    Hypertension Rachael Watson    Autism spectrum disorder Rachael Watson    Down syndrome Cousin    Bipolar disorder Sister    Schizophrenia Maternal Aunt    Social History   Socioeconomic History   Marital status: Divorced    Spouse name: Not on file   Number of children: Not on file   Years of education: Not on file   Highest education level: Not on file  Occupational History   Not on file  Tobacco Use   Smoking status: Every Day    Packs/day: 0.50    Years: 39.00    Total pack years: 19.50    Types: Cigarettes    Last attempt to quit: 09/03/2020    Years since quitting: 1.9   Smokeless tobacco: Former   Tobacco comments:    smokes 1/2 pack per day 07/03/22 Rachael Watson  Vaping Use   Vaping Use: Never used  Substance and Sexual Activity   Alcohol use: Yes    Comment: 3-4 x yearly   Drug use:  No    Comment: last 2006   Sexual activity: Not on file  Other Topics Concern   Not on file  Social History Narrative   Lives home with 57yrold autistic Rachael Watson and 723y old Rachael Watson.   She is disabled.  Education HS diploma,  Children 4.  Has boyfriend.  Caffeine every morning.    Social Determinants of Health   Financial Resource Strain: Not on file  Food Insecurity: Not on file  Transportation Needs: Not on file  Physical Activity: Not on file  Stress: Not on file  Social Connections: Not on file       Objective:   Physical Exam Vitals reviewed.   Constitutional:      General: She is not in acute distress.    Appearance: She is well-developed. She is obese.  HENT:     Head: Normocephalic and atraumatic.     Right Ear: Tympanic membrane normal.     Left Ear: Tympanic membrane normal.  Eyes:     Pupils: Pupils are equal, round, and reactive to light.  Neck:     Thyroid: No thyromegaly.  Cardiovascular:     Rate and Rhythm: Normal rate and regular rhythm.     Heart sounds: Normal heart sounds. No murmur heard. Pulmonary:     Effort: Pulmonary effort is normal. No respiratory distress.     Breath sounds: Normal breath sounds. No wheezing.  Abdominal:     General: Bowel sounds are normal. There is no distension.     Palpations: Abdomen is soft.     Tenderness: There is no abdominal tenderness.  Musculoskeletal:        General: No tenderness. Normal range of motion.     Cervical back: Normal range of motion and neck supple.  Skin:    General: Skin is warm and dry.  Neurological:     Mental Status: She is alert and oriented to person, place, and time.     Cranial Nerves: No cranial nerve deficit.     Deep Tendon Reflexes: Reflexes are normal and symmetric.  Psychiatric:        Behavior: Behavior normal.        Thought Content: Thought content normal.        Judgment: Judgment normal.       BP 103/62   Pulse 73   Temp (!) 96 F (35.6 C) (Temporal)   Ht 5' 6"$  (1.676 m)   Wt 181 lb 12.8 oz (82.5 kg)   SpO2 100%   BMI 29.34 kg/m      Assessment & Plan:   Rachael Huvalcomes in today with chief complaint of Medical Management of Chronic Issues (Pain management referral )   Diagnosis and orders addressed:  1. Annual physical exam - CMP14+EGFR - CBC with Differential/Platelet - Lipid panel - TSH  2. COPD GOLD 0/ active  smoking with AB - CMP14+EGFR - CBC with Differential/Platelet  3. Current smoker - CMP14+EGFR - CBC with Differential/Platelet  4. Depression, major, single episode, moderate  (HCC) Start Pristiq 50 mg  Keep follow up with Behavorial health - CMP14+EGFR - CBC with Differential/Platelet - desvenlafaxine (PRISTIQ) 50 MG 24 hr tablet; Take 1 tablet (50 mg total) by mouth daily.  Dispense: 90 tablet; Refill: 0  5. GAD (generalized anxiety disorder) - CMP14+EGFR - CBC with Differential/Platelet - desvenlafaxine (PRISTIQ) 50 MG 24 hr tablet; Take 1 tablet (50 mg total) by mouth daily.  Dispense: 90 tablet; Refill: 0  6. Gastroesophageal  reflux disease, unspecified whether esophagitis present - CMP14+EGFR - CBC with Differential/Platelet  7. Tobacco abuse - CMP14+EGFR - CBC with Differential/Platelet  8. Osteoarthritis of multiple joints, unspecified osteoarthritis type - CMP14+EGFR - CBC with Differential/Platelet - Ambulatory referral to Pain Clinic - Cane adjustable wide base quad - Elastic Bandages & Supports (LUMBAR BACK BRACE/SUPPORT PAD) MISC; 1 Application by Does not apply route daily.  Dispense: 1 each; Refill: 0 - Nerve Stimulator (TENS THERAPY REPLACE BACK PADS) MISC; 1 Application by Does not apply route daily as needed.  Dispense: 2 each; Refill: 0  9. Hypothyroidism, unspecified type - CMP14+EGFR - CBC with Differential/Platelet  10. Neuropathic pain - pregabalin (LYRICA) 50 MG capsule; Take 1 capsule (50 mg total) by mouth 3 (three) times daily.  Dispense: 90 capsule; Refill: 2  11. Overweight (BMI 25.0-29.9)  12. Prediabetes - Microalbumin/Creatinine Ratio, Urine    Labs pending Continue medications, keep follow up with Specialists  Health Maintenance reviewed Diet and exercise encouraged  Follow up plan: 3 months    Evelina Dun, FNP

## 2022-08-14 NOTE — Addendum Note (Signed)
Addended by: Evelina Dun A on: 08/14/2022 12:22 PM   Modules accepted: Orders

## 2022-08-14 NOTE — Telephone Encounter (Signed)
[facility] called stating that pts Rx for 10s until and back brace isnt covered there. Requesting Rx be sent somewhere where its covered.

## 2022-08-15 LAB — CBC WITH DIFFERENTIAL/PLATELET
Basophils Absolute: 0.1 10*3/uL (ref 0.0–0.2)
Basos: 1 %
EOS (ABSOLUTE): 0.1 10*3/uL (ref 0.0–0.4)
Eos: 1 %
Hematocrit: 38.5 % (ref 34.0–46.6)
Hemoglobin: 12.7 g/dL (ref 11.1–15.9)
Immature Grans (Abs): 0 10*3/uL (ref 0.0–0.1)
Immature Granulocytes: 0 %
Lymphocytes Absolute: 3 10*3/uL (ref 0.7–3.1)
Lymphs: 34 %
MCH: 30 pg (ref 26.6–33.0)
MCHC: 33 g/dL (ref 31.5–35.7)
MCV: 91 fL (ref 79–97)
Monocytes Absolute: 0.5 10*3/uL (ref 0.1–0.9)
Monocytes: 5 %
Neutrophils Absolute: 5.2 10*3/uL (ref 1.4–7.0)
Neutrophils: 59 %
Platelets: 305 10*3/uL (ref 150–450)
RBC: 4.24 x10E6/uL (ref 3.77–5.28)
RDW: 13.2 % (ref 11.7–15.4)
WBC: 8.9 10*3/uL (ref 3.4–10.8)

## 2022-08-15 LAB — CMP14+EGFR
ALT: 26 IU/L (ref 0–32)
AST: 27 IU/L (ref 0–40)
Albumin/Globulin Ratio: 2 (ref 1.2–2.2)
Albumin: 4.7 g/dL (ref 3.8–4.9)
Alkaline Phosphatase: 91 IU/L (ref 44–121)
BUN/Creatinine Ratio: 17 (ref 9–23)
BUN: 14 mg/dL (ref 6–24)
Bilirubin Total: 0.2 mg/dL (ref 0.0–1.2)
CO2: 18 mmol/L — ABNORMAL LOW (ref 20–29)
Calcium: 10.9 mg/dL — ABNORMAL HIGH (ref 8.7–10.2)
Chloride: 102 mmol/L (ref 96–106)
Creatinine, Ser: 0.84 mg/dL (ref 0.57–1.00)
Globulin, Total: 2.3 g/dL (ref 1.5–4.5)
Glucose: 80 mg/dL (ref 70–99)
Potassium: 5 mmol/L (ref 3.5–5.2)
Sodium: 141 mmol/L (ref 134–144)
Total Protein: 7 g/dL (ref 6.0–8.5)
eGFR: 82 mL/min/{1.73_m2} (ref >59)

## 2022-08-15 LAB — LIPID PANEL
Chol/HDL Ratio: 2.5 ratio (ref 0.0–4.4)
Cholesterol, Total: 167 mg/dL (ref 100–199)
HDL: 68 mg/dL (ref >39)
LDL Chol Calc (NIH): 80 mg/dL (ref 0–99)
Triglycerides: 108 mg/dL (ref 0–149)
VLDL Cholesterol Cal: 19 mg/dL (ref 5–40)

## 2022-08-15 LAB — TSH: TSH: 1.06 u[IU]/mL (ref 0.450–4.500)

## 2022-08-16 LAB — MICROALBUMIN / CREATININE URINE RATIO
Creatinine, Urine: 84 mg/dL
Microalb/Creat Ratio: 4 mg/g creat (ref 0–29)
Microalbumin, Urine: 3.2 ug/mL

## 2022-08-17 ENCOUNTER — Telehealth: Payer: Self-pay | Admitting: Nurse Practitioner

## 2022-08-17 ENCOUNTER — Other Ambulatory Visit: Payer: Self-pay | Admitting: Podiatry

## 2022-08-17 ENCOUNTER — Telehealth: Payer: Self-pay | Admitting: Family

## 2022-08-17 DIAGNOSIS — M159 Polyosteoarthritis, unspecified: Secondary | ICD-10-CM

## 2022-08-17 NOTE — Telephone Encounter (Signed)
Called and spoke with pt stating to her that I did not see where we had ordered a ct and she verbalized understanding. Nothing further needed.

## 2022-08-17 NOTE — Telephone Encounter (Signed)
Patient aware we do not have and did not order

## 2022-08-17 NOTE — Telephone Encounter (Signed)
ORDER ON YOUR DESK TO SIGN

## 2022-08-17 NOTE — Progress Notes (Signed)
Error

## 2022-08-17 NOTE — Telephone Encounter (Signed)
Pt called requesting that we fax her cane Rx to [facility] and says it needs to be the one with small pieces of metal at the bottom to help her with her balance.

## 2022-08-17 NOTE — Telephone Encounter (Signed)
Please fax form

## 2022-08-17 NOTE — Telephone Encounter (Signed)
Faxed to [facility]

## 2022-08-18 DIAGNOSIS — F411 Generalized anxiety disorder: Secondary | ICD-10-CM | POA: Diagnosis not present

## 2022-08-18 DIAGNOSIS — F4312 Post-traumatic stress disorder, chronic: Secondary | ICD-10-CM | POA: Diagnosis not present

## 2022-08-18 DIAGNOSIS — F902 Attention-deficit hyperactivity disorder, combined type: Secondary | ICD-10-CM | POA: Diagnosis not present

## 2022-08-18 DIAGNOSIS — F4310 Post-traumatic stress disorder, unspecified: Secondary | ICD-10-CM | POA: Diagnosis not present

## 2022-08-18 DIAGNOSIS — F603 Borderline personality disorder: Secondary | ICD-10-CM | POA: Diagnosis not present

## 2022-08-20 ENCOUNTER — Ambulatory Visit: Payer: Medicaid Other | Attending: Cardiology | Admitting: Nurse Practitioner

## 2022-08-20 ENCOUNTER — Encounter: Payer: Self-pay | Admitting: Nurse Practitioner

## 2022-08-20 VITALS — BP 110/74 | HR 73 | Ht 66.0 in | Wt 183.8 lb

## 2022-08-20 DIAGNOSIS — I6523 Occlusion and stenosis of bilateral carotid arteries: Secondary | ICD-10-CM

## 2022-08-20 DIAGNOSIS — F4310 Post-traumatic stress disorder, unspecified: Secondary | ICD-10-CM

## 2022-08-20 DIAGNOSIS — K219 Gastro-esophageal reflux disease without esophagitis: Secondary | ICD-10-CM

## 2022-08-20 DIAGNOSIS — R0789 Other chest pain: Secondary | ICD-10-CM

## 2022-08-20 DIAGNOSIS — Z8679 Personal history of other diseases of the circulatory system: Secondary | ICD-10-CM

## 2022-08-20 DIAGNOSIS — F419 Anxiety disorder, unspecified: Secondary | ICD-10-CM | POA: Diagnosis not present

## 2022-08-20 MED ORDER — OMEPRAZOLE 40 MG PO CPDR: 40.0000 mg | DELAYED_RELEASE_CAPSULE | Freq: Every day | ORAL | 3 refills | Status: AC
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
## 2022-08-20 NOTE — Patient Instructions (Addendum)
Medication Instructions:  START omeprazole 40 mg daily  *If you need a refill on your cardiac medications before your next appointment, please call your pharmacy*  Testing/Procedures: Your physician has requested that you have a carotid duplex in NOVEMBER 2024. This test is an ultrasound of the carotid arteries in your neck. It looks at blood flow through these arteries that supply the brain with blood. Allow one hour for this exam. There are no restrictions or special instructions.  Follow-Up: At [facility], you and your health needs are our priority.  As part of our continuing mission to provide you with exceptional heart care, we have created designated Provider Care Teams.  These Care Teams include your primary Cardiologist (physician) and Advanced Practice Providers (APPs -  Physician Assistants and Nurse Practitioners) who all work together to provide you with the care you need, when you need it.  We recommend signing up for the patient portal called "MyChart".  Sign up information is provided on this After Visit Summary.  MyChart is used to connect with patients for Virtual Visits (Telemedicine).  Patients are able to view lab/test results, encounter notes, upcoming appointments, etc.  Non-urgent messages can be sent to your provider as well.   To learn more about what you can do with MyChart, go to [email or web address].    Your next appointment:   3-4 month(s)  Provider:   Finis Bud, NP  Other Instructions You have been referred to: Peripheral vascular specialist (Dr. Gwenlyn Found or Dr. Fletcher Anon)

## 2022-08-21 DIAGNOSIS — F4312 Post-traumatic stress disorder, chronic: Secondary | ICD-10-CM | POA: Diagnosis not present

## 2022-08-21 DIAGNOSIS — F902 Attention-deficit hyperactivity disorder, combined type: Secondary | ICD-10-CM | POA: Diagnosis not present

## 2022-08-24 ENCOUNTER — Telehealth: Payer: Self-pay | Admitting: Family

## 2022-08-24 DIAGNOSIS — J209 Acute bronchitis, unspecified: Secondary | ICD-10-CM | POA: Diagnosis not present

## 2022-08-24 DIAGNOSIS — M25512 Pain in left shoulder: Secondary | ICD-10-CM | POA: Diagnosis not present

## 2022-08-24 DIAGNOSIS — F319 Bipolar disorder, unspecified: Secondary | ICD-10-CM | POA: Diagnosis not present

## 2022-08-24 DIAGNOSIS — M5442 Lumbago with sciatica, left side: Secondary | ICD-10-CM | POA: Diagnosis not present

## 2022-08-24 DIAGNOSIS — E7800 Pure hypercholesterolemia, unspecified: Secondary | ICD-10-CM | POA: Diagnosis not present

## 2022-08-24 DIAGNOSIS — K219 Gastro-esophageal reflux disease without esophagitis: Secondary | ICD-10-CM | POA: Diagnosis not present

## 2022-08-24 DIAGNOSIS — F411 Generalized anxiety disorder: Secondary | ICD-10-CM | POA: Diagnosis not present

## 2022-08-24 DIAGNOSIS — J45909 Unspecified asthma, uncomplicated: Secondary | ICD-10-CM | POA: Diagnosis not present

## 2022-08-24 DIAGNOSIS — M5441 Lumbago with sciatica, right side: Secondary | ICD-10-CM | POA: Diagnosis not present

## 2022-08-24 DIAGNOSIS — M25511 Pain in right shoulder: Secondary | ICD-10-CM | POA: Diagnosis not present

## 2022-08-24 DIAGNOSIS — R531 Weakness: Secondary | ICD-10-CM

## 2022-08-24 DIAGNOSIS — G8929 Other chronic pain: Secondary | ICD-10-CM | POA: Diagnosis not present

## 2022-08-24 NOTE — Telephone Encounter (Signed)
Prescription sent to pharmacy.

## 2022-08-25 ENCOUNTER — Telehealth: Payer: Self-pay | Admitting: Family

## 2022-08-25 ENCOUNTER — Other Ambulatory Visit: Payer: Self-pay | Admitting: Family Medicine

## 2022-08-25 DIAGNOSIS — M48061 Spinal stenosis, lumbar region without neurogenic claudication: Secondary | ICD-10-CM

## 2022-08-25 DIAGNOSIS — M159 Polyosteoarthritis, unspecified: Secondary | ICD-10-CM

## 2022-08-25 DIAGNOSIS — F4312 Post-traumatic stress disorder, chronic: Secondary | ICD-10-CM | POA: Diagnosis not present

## 2022-08-25 DIAGNOSIS — F902 Attention-deficit hyperactivity disorder, combined type: Secondary | ICD-10-CM | POA: Diagnosis not present

## 2022-08-25 NOTE — Telephone Encounter (Signed)
PRINTED BOTH PLEASE SIGN

## 2022-08-25 NOTE — Telephone Encounter (Signed)
Referral to PT placed.   Rachael Dun, FNP

## 2022-08-25 NOTE — Telephone Encounter (Signed)
Usually for a fitted back brace, patient gets supple from a certain place and then they send up paper work for this. I recommend her contact the place she wants it from.

## 2022-08-25 NOTE — Telephone Encounter (Signed)
Please fax forms.    Evelina Dun, FNP

## 2022-08-25 NOTE — Telephone Encounter (Signed)
Pt called to let PCP know that she not only needs referral for pain management but also needs referral for PT.  Please advise and call patient with update.

## 2022-08-25 NOTE — Telephone Encounter (Signed)
Patient aware and verbalized understanding. °

## 2022-08-25 NOTE — Telephone Encounter (Signed)
Pt needs to fitted for back brace. Pt says that she is familiar with [facility] in [place]. Can the rx be sent there? Please call back to let pt know that rx for cane has been sent in so that pt can pick up. Also for the back brace.

## 2022-08-25 NOTE — Progress Notes (Signed)
CAN REFAXED PLEASE ORDER TENS UNIT

## 2022-08-26 ENCOUNTER — Telehealth: Payer: Self-pay

## 2022-08-26 ENCOUNTER — Ambulatory Visit: Payer: Medicaid Other | Admitting: Cardiovascular Disease

## 2022-08-26 DIAGNOSIS — F4310 Post-traumatic stress disorder, unspecified: Secondary | ICD-10-CM | POA: Diagnosis not present

## 2022-08-26 DIAGNOSIS — I6523 Occlusion and stenosis of bilateral carotid arteries: Secondary | ICD-10-CM

## 2022-08-26 DIAGNOSIS — F603 Borderline personality disorder: Secondary | ICD-10-CM | POA: Diagnosis not present

## 2022-08-26 DIAGNOSIS — F411 Generalized anxiety disorder: Secondary | ICD-10-CM | POA: Diagnosis not present

## 2022-08-26 NOTE — Telephone Encounter (Signed)
Per Finis Bud, referral placed to [facility]: bilateral carotid artery stenosis. Referral for PVC closed.

## 2022-08-27 ENCOUNTER — Ambulatory Visit: Payer: Medicaid Other | Admitting: Nurse Practitioner

## 2022-08-28 DIAGNOSIS — F4312 Post-traumatic stress disorder, chronic: Secondary | ICD-10-CM | POA: Diagnosis not present

## 2022-08-28 DIAGNOSIS — F902 Attention-deficit hyperactivity disorder, combined type: Secondary | ICD-10-CM | POA: Diagnosis not present

## 2022-08-31 ENCOUNTER — Telehealth: Payer: Self-pay | Admitting: Family

## 2022-08-31 ENCOUNTER — Telehealth (INDEPENDENT_AMBULATORY_CARE_PROVIDER_SITE_OTHER): Payer: Medicaid Other | Admitting: Family

## 2022-08-31 ENCOUNTER — Encounter: Payer: Self-pay | Admitting: Family

## 2022-08-31 DIAGNOSIS — J42 Unspecified chronic bronchitis: Secondary | ICD-10-CM

## 2022-08-31 DIAGNOSIS — J441 Chronic obstructive pulmonary disease with (acute) exacerbation: Secondary | ICD-10-CM

## 2022-08-31 MED ORDER — DOXYCYCLINE HYCLATE 100 MG PO TABS: 100.0000 mg | ORAL_TABLET | Freq: Two times a day (BID) | ORAL | 0 refills | Status: DC

## 2022-08-31 NOTE — Telephone Encounter (Signed)
Please fax script

## 2022-08-31 NOTE — Telephone Encounter (Signed)
Patient aware that script is being faxed to [facility]

## 2022-08-31 NOTE — Progress Notes (Signed)
Virtual Visit Consent   Rachael Watson, you are scheduled for a virtual visit with a [facility] provider today. Just as with appointments in the office, your consent must be obtained to participate. Your consent will be active for this visit and any virtual visit you may have with one of our providers in the next 365 days. If you have a MyChart account, a copy of this consent can be sent to you electronically.  As this is a virtual visit, video technology does not allow for your provider to perform a traditional examination. This may limit your provider's ability to fully assess your condition. If your provider identifies any concerns that need to be evaluated in person or the need to arrange testing (such as labs, EKG, etc.), we will make arrangements to do so. Although advances in technology are sophisticated, we cannot ensure that it will always work on either your end or our end. If the connection with a video visit is poor, the visit may have to be switched to a telephone visit. With either a video or telephone visit, we are not always able to ensure that we have a secure connection.  By engaging in this virtual visit, you consent to the provision of healthcare and authorize for your insurance to be billed (if applicable) for the services provided during this visit. Depending on your insurance coverage, you may receive a charge related to this service.  I need to obtain your verbal consent now. Are you willing to proceed with your visit today? Rachael Watson has provided verbal consent on 08/31/2022 for a virtual visit (video or telephone). Evelina Dun, FNP  Date: 08/31/2022 9:48 AM  Virtual Visit via Video Note   I, Evelina Dun, connected with  Rachael Watson  ([id number], 1965-12-08) on 08/31/22 at  9:25 AM EST by a video-enabled telemedicine application and verified that I am speaking with the correct person using two identifiers.  Location: Patient: Virtual Visit Location Patient:  Home Provider: Virtual Visit Location Provider: Home Office   I discussed the limitations of evaluation and management by telemedicine and the availability of in person appointments. The patient expressed understanding and agreed to proceed.    History of Present Illness: Rachael Watson is a 57 y.o. who identifies as a female who was assigned female at birth, and is being seen today for cough.  HPI: Cough This is a new problem. The current episode started 1 to 4 weeks ago. The problem has been gradually worsening. The problem occurs every few minutes. The cough is Productive of sputum. Associated symptoms include chills, Watson congestion, Watson pain, a fever, headaches, nasal congestion, postnasal drip, rhinorrhea, shortness of breath and wheezing. Associated symptoms comments: Weakness . Risk factors for lung disease include smoking/tobacco exposure. She has tried rest and OTC cough suppressant for the symptoms. The treatment provided mild relief. Her past medical history is significant for COPD.    Problems:  Patient Active Problem List   Diagnosis Date Noted   Tobacco abuse 07/03/2022   COPD GOLD 0/ active  smoking with AB 02/26/2020   Osteopenia 10/31/2019   Constipation 05/01/2019   Hypothyroidism 05/01/2019   Osteoarthritis 02/17/2018   Overweight (BMI 25.0-29.9) 02/15/2018   Current smoker 02/15/2018   Chest pain, precordial 12/17/2017   Family hx of colon cancer 07/21/2017   Lumbar stenosis 07/02/2017   Neuropathic pain 07/02/2017   Depression, major, single episode, moderate ([place]) 07/02/2017   GAD (generalized anxiety disorder) 07/02/2017   GERD (gastroesophageal reflux disease) 07/02/2017  Hand pain, right 05/20/2017    Allergies:  Allergies  Allergen Reactions   Abilify [Aripiprazole] Other (See Comments)    Violent behavior   Quetiapine Fumarate Other (See Comments)    Pt felt paralyzed    Risperidone Other (See Comments)    Unknown   Seroquel [Quetiapine] Other (See  Comments)    Tremors and unable to swallow   Medications:  Current Outpatient Medications:    doxycycline (VIBRA-TABS) 100 MG tablet, Take 1 tablet (100 mg total) by mouth 2 (two) times daily., Disp: 20 tablet, Rfl: 0   ammonium lactate (AMLACTIN) 12 % lotion, Apply 1 Application topically as needed for dry skin., Disp: 400 g, Rfl: 0   atorvastatin (LIPITOR) 40 MG tablet, TAKE ONE TABLET BY MOUTH EVERY DAY, Disp: 90 tablet, Rfl: 1   Budeson-Glycopyrrol-Formoterol (BREZTRI AEROSPHERE) 160-9-4.8 MCG/ACT AERO, Inhale 2 puffs into the lungs in the morning and at bedtime., Disp: 2 each, Rfl: 0   Calcium Carb-Cholecalciferol (CALCIUM 1000 + D PO), Take 1 tablet by mouth daily. (Patient not taking: Reported on 08/20/2022), Disp: , Rfl:    dexmethylphenidate (FOCALIN XR) 20 MG 24 hr capsule, Take 20 mg by mouth daily., Disp: , Rfl:    furosemide (LASIX) 40 MG tablet, TAKE 1 TABLET BY MOUTH ONCE A DAY. MAY TAKE AN ADDITIONAL 1/2 TO 1 TABLET AS NEEDED FOR SWELLING., Disp: 90 tablet, Rfl: 1   hydrOXYzine (ATARAX) 25 MG tablet, Take 25 mg by mouth 3 (three) times daily as needed., Disp: , Rfl:    isosorbide mononitrate (IMDUR) 30 MG 24 hr tablet, TAKE 1/2 (ONE-HALF) TABLET BY MOUTH ONCE DAILY PATIENT  NEEDS  TO  SEE  CARDIOLOGIST  FOR  FURTHER  REFILLS (Patient not taking: Reported on 08/20/2022), Disp: 45 tablet, Rfl: 1   Levothyroxine Sodium (LEVOTHROID PO), Take by mouth., Disp: , Rfl:    LINZESS 290 MCG CAPS capsule, TAKE 1 CAPSULE BY MOUTH ONCE DAILY BEFORE BREAKFAST, Disp: 90 capsule, Rfl: 0   Nerve Stimulator (TENS THERAPY REPLACE BACK PADS) MISC, 1 Application by Does not apply route daily as needed., Disp: 2 each, Rfl: 0   omeprazole (PRILOSEC) 40 MG capsule, Take 1 capsule (40 mg total) by mouth daily., Disp: 90 capsule, Rfl: 3   oxyCODONE (OXY IR/ROXICODONE) 5 MG immediate release tablet, Take 5 mg by mouth 4 (four) times daily as needed., Disp: , Rfl:    pregabalin (LYRICA) 50 MG capsule, Take 1  capsule (50 mg total) by mouth 3 (three) times daily., Disp: 90 capsule, Rfl: 2   PROAIR HFA 108 (90 Base) MCG/ACT inhaler, INHALE 2 PUFFS BY MOUTH EVERY 6 HOURS AS NEEDED FOR WHEEZING FOR SHORTNESS OF BREATH, Disp: 9 g, Rfl: 2   VRAYLAR 3 MG capsule, Take 3 mg by mouth daily., Disp: , Rfl:    XIIDRA 5 % SOLN, , Disp: , Rfl:   Observations/Objective: Patient is well-developed, well-nourished in no acute distress.  Resting comfortably  at home.  Head is normocephalic, atraumatic.  No labored breathing.  Speech is clear and coherent with logical content.  Patient is alert and oriented at baseline.  Coarse cough  Assessment and Plan: 1. COPD exacerbation (HCC) - doxycycline (VIBRA-TABS) 100 MG tablet; Take 1 tablet (100 mg total) by mouth 2 (two) times daily.  Dispense: 20 tablet; Refill: 0  - Take meds as prescribed - Use a cool mist humidifier  -Use saline nose sprays frequently -Force fluids -For any cough or congestion  Use plain Mucinex- regular  strength or max strength is fine -For fever or aces or pains- take tylenol or ibuprofen. -Throat lozenges if help -Follow up if symptoms worsen or do not improve   Follow Up Instructions: I discussed the assessment and treatment plan with the patient. The patient was provided an opportunity to ask questions and all were answered. The patient agreed with the plan and demonstrated an understanding of the instructions.  A copy of instructions were sent to the patient via MyChart unless otherwise noted below.     The patient was advised to call back or seek an in-person evaluation if the symptoms worsen or if the condition fails to improve as anticipated.  Time:  I spent 12 minutes with the patient via telehealth technology discussing the above problems/concerns.    Evelina Dun, FNP

## 2022-09-02 ENCOUNTER — Ambulatory Visit: Payer: Medicaid Other | Admitting: Physical Therapy

## 2022-09-04 ENCOUNTER — Telehealth: Payer: Self-pay | Admitting: Family

## 2022-09-04 DIAGNOSIS — F902 Attention-deficit hyperactivity disorder, combined type: Secondary | ICD-10-CM | POA: Diagnosis not present

## 2022-09-04 DIAGNOSIS — F4312 Post-traumatic stress disorder, chronic: Secondary | ICD-10-CM | POA: Diagnosis not present

## 2022-09-07 NOTE — Telephone Encounter (Signed)
Please resend

## 2022-09-08 ENCOUNTER — Ambulatory Visit: Payer: Medicaid Other | Admitting: Podiatry

## 2022-09-08 ENCOUNTER — Telehealth: Payer: Self-pay | Admitting: Family

## 2022-09-08 ENCOUNTER — Encounter: Payer: Self-pay | Admitting: Physical Medicine & Rehabilitation

## 2022-09-08 DIAGNOSIS — F902 Attention-deficit hyperactivity disorder, combined type: Secondary | ICD-10-CM | POA: Diagnosis not present

## 2022-09-08 DIAGNOSIS — F4312 Post-traumatic stress disorder, chronic: Secondary | ICD-10-CM | POA: Diagnosis not present

## 2022-09-08 NOTE — Telephone Encounter (Signed)
Order reprinted on PCP's desk

## 2022-09-09 ENCOUNTER — Telehealth: Payer: Self-pay | Admitting: Family

## 2022-09-09 DIAGNOSIS — M792 Neuralgia and neuritis, unspecified: Secondary | ICD-10-CM

## 2022-09-09 NOTE — Telephone Encounter (Signed)
Patient called stating that she needs her pain management referral sent to someone who can prescribe her Oxycodone because the Roboxin isnt helping and PT wont either.   Please advise and call patient.

## 2022-09-10 ENCOUNTER — Encounter: Payer: Medicaid Other | Admitting: Physical Medicine & Rehabilitation

## 2022-09-10 NOTE — Telephone Encounter (Signed)
Pt has been scheduled an appointment   Evelina Dun, FNP

## 2022-09-11 ENCOUNTER — Encounter: Payer: Medicaid Other | Admitting: Vascular Surgery

## 2022-09-11 ENCOUNTER — Telehealth: Payer: Self-pay | Admitting: Family

## 2022-09-11 DIAGNOSIS — F4312 Post-traumatic stress disorder, chronic: Secondary | ICD-10-CM | POA: Diagnosis not present

## 2022-09-11 DIAGNOSIS — R42 Dizziness and giddiness: Secondary | ICD-10-CM

## 2022-09-11 DIAGNOSIS — F902 Attention-deficit hyperactivity disorder, combined type: Secondary | ICD-10-CM | POA: Diagnosis not present

## 2022-09-11 NOTE — Telephone Encounter (Signed)
Referral placed.   Sajad Glander, FNP  

## 2022-09-11 NOTE — Telephone Encounter (Signed)
Patient is calling back checking on referral to pain management. She can't do PT without pain medication. Please call.

## 2022-09-11 NOTE — Telephone Encounter (Signed)
REFERRAL REQUEST Telephone Note  Have you been seen at our office for this problem? YES (Advise that they may need an appointment with their PCP before a referral can be done)  Reason for Referral: Vertigo, sinuses and ear bleed Referral discussed with patient: YES  Best contact number of patient for referral team: [phone]    Has patient been seen by a specialist for this issue before: NO  Patient provider preference for referral: [facility] Patient location preference for referral: [address] [place]   Patient notified that referrals can take up to a week or longer to process. If they haven't heard anything within a week they should call back and speak with the referral department.

## 2022-09-11 NOTE — Addendum Note (Signed)
Addended by: Ladean Raya on: 09/11/2022 08:52 AM   Modules accepted: Orders

## 2022-09-11 NOTE — Telephone Encounter (Signed)
Referral put in.

## 2022-09-11 NOTE — Telephone Encounter (Signed)
Closing encounter, duplicate, pt aware today this was faxed to [facility]

## 2022-09-11 NOTE — Telephone Encounter (Signed)
Pt aware orders faxed to [facility] to [phone]

## 2022-09-11 NOTE — Telephone Encounter (Signed)
Patient aware.

## 2022-09-14 ENCOUNTER — Other Ambulatory Visit: Payer: Self-pay | Admitting: *Deleted

## 2022-09-14 DIAGNOSIS — I779 Disorder of arteries and arterioles, unspecified: Secondary | ICD-10-CM

## 2022-09-15 DIAGNOSIS — J42 Unspecified chronic bronchitis: Secondary | ICD-10-CM | POA: Diagnosis not present

## 2022-09-15 DIAGNOSIS — J449 Chronic obstructive pulmonary disease, unspecified: Secondary | ICD-10-CM | POA: Diagnosis not present

## 2022-09-15 DIAGNOSIS — G8929 Other chronic pain: Secondary | ICD-10-CM | POA: Diagnosis not present

## 2022-09-16 ENCOUNTER — Institutional Professional Consult (permissible substitution): Payer: Medicaid Other | Admitting: Cardiovascular Disease

## 2022-09-16 ENCOUNTER — Ambulatory Visit: Payer: Medicaid Other | Admitting: Physical Therapy

## 2022-09-16 NOTE — Telephone Encounter (Signed)
Closing encounter, duplicate documented in other encounter.

## 2022-09-16 NOTE — Telephone Encounter (Signed)
Pt says that she still has not received the nebulizar machine.

## 2022-09-16 NOTE — Telephone Encounter (Signed)
Signed order received from [facility] for the Tens Unit has been faxed back today. Order for nebulizer was received and placed on PCP's desk for signature, this message was left on pt's VM. To me the order looks to have order for a nebulizer machine - it says Pneu Neb on it, our order says DME nebulizer machine & states pt needs nebulizer machine, message to patient that I left was let's see when after I fax this signed order to see about the nebulizer machine.

## 2022-09-18 ENCOUNTER — Encounter: Payer: Medicaid Other | Admitting: Vascular Surgery

## 2022-09-18 ENCOUNTER — Ambulatory Visit (HOSPITAL_COMMUNITY): Payer: Medicaid Other

## 2022-09-18 DIAGNOSIS — F902 Attention-deficit hyperactivity disorder, combined type: Secondary | ICD-10-CM | POA: Diagnosis not present

## 2022-09-18 DIAGNOSIS — F4312 Post-traumatic stress disorder, chronic: Secondary | ICD-10-CM | POA: Diagnosis not present

## 2022-09-18 NOTE — Telephone Encounter (Signed)
Pt says that [facility] still does not have her referral.

## 2022-09-18 NOTE — Telephone Encounter (Signed)
It may take weeks to hear from ENT. Referral placed.

## 2022-09-18 NOTE — Telephone Encounter (Signed)
NA

## 2022-09-22 DIAGNOSIS — F4312 Post-traumatic stress disorder, chronic: Secondary | ICD-10-CM | POA: Diagnosis not present

## 2022-09-22 DIAGNOSIS — F902 Attention-deficit hyperactivity disorder, combined type: Secondary | ICD-10-CM | POA: Diagnosis not present

## 2022-09-25 DIAGNOSIS — F4312 Post-traumatic stress disorder, chronic: Secondary | ICD-10-CM | POA: Diagnosis not present

## 2022-09-25 DIAGNOSIS — F902 Attention-deficit hyperactivity disorder, combined type: Secondary | ICD-10-CM | POA: Diagnosis not present

## 2022-09-28 ENCOUNTER — Encounter: Payer: Self-pay | Admitting: *Deleted

## 2022-09-28 ENCOUNTER — Ambulatory Visit (HOSPITAL_COMMUNITY): Payer: Medicaid Other

## 2022-09-28 ENCOUNTER — Encounter: Payer: Medicaid Other | Admitting: Surgery

## 2022-09-29 ENCOUNTER — Ambulatory Visit: Payer: Medicaid Other | Admitting: Family

## 2022-09-29 DIAGNOSIS — J42 Unspecified chronic bronchitis: Secondary | ICD-10-CM | POA: Diagnosis not present

## 2022-09-29 DIAGNOSIS — F902 Attention-deficit hyperactivity disorder, combined type: Secondary | ICD-10-CM | POA: Diagnosis not present

## 2022-09-29 DIAGNOSIS — F4312 Post-traumatic stress disorder, chronic: Secondary | ICD-10-CM | POA: Diagnosis not present

## 2022-09-29 DIAGNOSIS — J449 Chronic obstructive pulmonary disease, unspecified: Secondary | ICD-10-CM | POA: Diagnosis not present

## 2022-09-29 DIAGNOSIS — G8929 Other chronic pain: Secondary | ICD-10-CM | POA: Diagnosis not present

## 2022-10-01 ENCOUNTER — Ambulatory Visit: Payer: Medicaid Other | Admitting: Family

## 2022-10-02 ENCOUNTER — Ambulatory Visit: Payer: Medicaid Other | Admitting: Nurse Practitioner

## 2022-10-06 ENCOUNTER — Encounter: Payer: Self-pay | Admitting: Family Medicine

## 2022-10-06 ENCOUNTER — Telehealth (INDEPENDENT_AMBULATORY_CARE_PROVIDER_SITE_OTHER): Payer: Medicaid Other | Admitting: Family Medicine

## 2022-10-06 DIAGNOSIS — F902 Attention-deficit hyperactivity disorder, combined type: Secondary | ICD-10-CM | POA: Diagnosis not present

## 2022-10-06 DIAGNOSIS — J0140 Acute pansinusitis, unspecified: Secondary | ICD-10-CM | POA: Diagnosis not present

## 2022-10-06 DIAGNOSIS — F4312 Post-traumatic stress disorder, chronic: Secondary | ICD-10-CM | POA: Diagnosis not present

## 2022-10-06 MED ORDER — FLUTICASONE PROPIONATE 50 MCG/ACT NA SUSP: 2.0000 | Freq: Every day | NASAL | 6 refills | Status: AC

## 2022-10-06 MED ORDER — AMOXICILLIN-POT CLAVULANATE 875-125 MG PO TABS: 1.0000 | ORAL_TABLET | Freq: Two times a day (BID) | ORAL | 0 refills | Status: AC

## 2022-10-06 NOTE — Progress Notes (Signed)
Virtual Visit via Video   I connected with patient on 10/06/22 at 1305 by a video enabled telemedicine application and verified that I am speaking with the correct person using two identifiers.  Location patient: Home Location provider: [place] participating in the virtual visit: Patient and Provider  I discussed the limitations of evaluation and management by telemedicine and the availability of in person appointments. The patient expressed understanding and agreed to proceed.  Subjective:   HPI:  Pt presents today for  Chief Complaint  Patient presents with   Sinus Problem   Pt presents today with complaints of sinus pressure and pain. States this has been ongoing for over 2 weeks. She has been taking her antihistamine and benadryl without relief of symptoms. She has not been using a nasal spray. Does have green sputum or mucus production.   Review of Systems  Constitutional:  Positive for malaise/fatigue. Negative for chills, diaphoresis, fever and weight loss.  HENT:  Positive for congestion, ear pain, sinus pain and sore throat. Negative for ear discharge, hearing loss, nosebleeds and tinnitus.   Respiratory:  Positive for cough and sputum production. Negative for hemoptysis, shortness of breath, wheezing and stridor.   Cardiovascular:  Negative for chest pain, palpitations, orthopnea, claudication, leg swelling and PND.  Genitourinary:  Negative for dysuria.  Musculoskeletal:  Negative for neck pain.  Skin:  Negative for itching and rash.  Neurological:  Positive for headaches. Negative for dizziness, tingling, tremors, sensory change, speech change, focal weakness, seizures, loss of consciousness and weakness.  All other systems reviewed and are negative.    Patient Active Problem List   Diagnosis Date Noted   Tobacco abuse 07/03/2022   COPD GOLD 0/ active  smoking with AB 02/26/2020   Osteopenia 10/31/2019   Constipation  05/01/2019   Hypothyroidism 05/01/2019   Osteoarthritis 02/17/2018   Overweight (BMI 25.0-29.9) 02/15/2018   Current smoker 02/15/2018   Chest pain, precordial 12/17/2017   Family hx of colon cancer 07/21/2017   Lumbar stenosis 07/02/2017   Neuropathic pain 07/02/2017   Depression, major, single episode, moderate 07/02/2017   GAD (generalized anxiety disorder) 07/02/2017   GERD (gastroesophageal reflux disease) 07/02/2017   Hand pain, right 05/20/2017    Social History   Tobacco Use   Smoking status: Every Day    Packs/day: 0.50    Years: 39.00    Additional pack years: 0.00    Total pack years: 19.50    Types: Cigarettes    Last attempt to quit: 09/03/2020    Years since quitting: 2.0   Smokeless tobacco: Former   Tobacco comments:    smokes 1/2 pack per day 07/03/22 Patty Sermons  Substance Use Topics   Alcohol use: Yes    Comment: 3-4 x yearly    Current Outpatient Medications:    amoxicillin-clavulanate (AUGMENTIN) 875-125 MG tablet, Take 1 tablet by mouth 2 (two) times daily for 10 days., Disp: 20 tablet, Rfl: 0   fluticasone (FLONASE) 50 MCG/ACT nasal spray, Place 2 sprays into both nostrils daily., Disp: 16 g, Rfl: 6   ammonium lactate (AMLACTIN) 12 % lotion, Apply 1 Application topically as needed for dry skin., Disp: 400 g, Rfl: 0   atorvastatin (LIPITOR) 40 MG tablet, TAKE ONE TABLET BY MOUTH EVERY DAY, Disp: 90 tablet, Rfl: 1   Budeson-Glycopyrrol-Formoterol (BREZTRI AEROSPHERE) 160-9-4.8 MCG/ACT AERO, Inhale 2 puffs into the lungs in the morning and at bedtime., Disp: 2 each, Rfl: 0   Calcium Carb-Cholecalciferol (CALCIUM  1000 + D PO), Take 1 tablet by mouth daily. (Patient not taking: Reported on 08/20/2022), Disp: , Rfl:    dexmethylphenidate (FOCALIN XR) 20 MG 24 hr capsule, Take 20 mg by mouth daily., Disp: , Rfl:    furosemide (LASIX) 40 MG tablet, TAKE 1 TABLET BY MOUTH ONCE A DAY. MAY TAKE AN ADDITIONAL 1/2 TO 1 TABLET AS NEEDED FOR SWELLING., Disp: 90 tablet, Rfl: 1    hydrOXYzine (ATARAX) 25 MG tablet, Take 25 mg by mouth 3 (three) times daily as needed., Disp: , Rfl:    isosorbide mononitrate (IMDUR) 30 MG 24 hr tablet, TAKE 1/2 (ONE-HALF) TABLET BY MOUTH ONCE DAILY PATIENT  NEEDS  TO  SEE  CARDIOLOGIST  FOR  FURTHER  REFILLS (Patient not taking: Reported on 08/20/2022), Disp: 45 tablet, Rfl: 1   Levothyroxine Sodium (LEVOTHROID PO), Take by mouth., Disp: , Rfl:    LINZESS 290 MCG CAPS capsule, TAKE 1 CAPSULE BY MOUTH ONCE DAILY BEFORE BREAKFAST, Disp: 90 capsule, Rfl: 0   Nerve Stimulator (TENS THERAPY REPLACE BACK PADS) MISC, 1 Application by Does not apply route daily as needed., Disp: 2 each, Rfl: 0   omeprazole (PRILOSEC) 40 MG capsule, Take 1 capsule (40 mg total) by mouth daily., Disp: 90 capsule, Rfl: 3   oxyCODONE (OXY IR/ROXICODONE) 5 MG immediate release tablet, Take 5 mg by mouth 4 (four) times daily as needed., Disp: , Rfl:    pregabalin (LYRICA) 50 MG capsule, Take 1 capsule (50 mg total) by mouth 3 (three) times daily., Disp: 90 capsule, Rfl: 2   PROAIR HFA 108 (90 Base) MCG/ACT inhaler, INHALE 2 PUFFS BY MOUTH EVERY 6 HOURS AS NEEDED FOR WHEEZING FOR SHORTNESS OF BREATH, Disp: 9 g, Rfl: 2   VRAYLAR 3 MG capsule, Take 3 mg by mouth daily., Disp: , Rfl:    XIIDRA 5 % SOLN, , Disp: , Rfl:   Allergies  Allergen Reactions   Abilify [Aripiprazole] Other (See Comments)    Violent behavior   Quetiapine Fumarate Other (See Comments)    Pt felt paralyzed    Risperidone Other (See Comments)    Unknown   Seroquel [Quetiapine] Other (See Comments)    Tremors and unable to swallow    Objective:   There were no vitals taken for this visit.  Patient is well-developed, well-nourished in no acute distress.  Resting comfortably at home.  Head is normocephalic, atraumatic.  No labored breathing.  Speech is clear and coherent with logical content.  Patient is alert and oriented at baseline.  No acute distress.   Assessment and Plan:   [place] was  seen today for sinus problem.  Diagnoses and all orders for this visit:  Acute non-recurrent pansinusitis Has tried and failed symptomatic care at home. Will initiate below. Aware of symptomatic care. Report new, worsening, or persistent symptoms. -     amoxicillin-clavulanate (AUGMENTIN) 875-125 MG tablet; Take 1 tablet by mouth 2 (two) times daily for 10 days. -     fluticasone (FLONASE) 50 MCG/ACT nasal spray; Place 2 sprays into both nostrils daily.      Return if symptoms worsen or fail to improve.  Monia Pouch, FNP-C [facility] [address] [place], [place] [address] [phone]  10/06/2022  Time spent with the patient: 13 minutes, of which >50% was spent in obtaining information about symptoms, reviewing previous labs, evaluations, and treatments, counseling about condition (please see the discussed topics above), and developing a plan to further investigate it;  had a number of questions which I addressed.

## 2022-10-12 ENCOUNTER — Telehealth: Payer: Self-pay | Admitting: Family

## 2022-10-12 DIAGNOSIS — R42 Dizziness and giddiness: Secondary | ICD-10-CM

## 2022-10-12 NOTE — Addendum Note (Signed)
Addended by: Jannifer Rodney A on: 10/12/2022 02:53 PM   Modules accepted: Orders

## 2022-10-12 NOTE — Telephone Encounter (Signed)
What office does she have an appointment with so I can placed the referral to right place.

## 2022-10-12 NOTE — Telephone Encounter (Signed)
ENT referral placed.

## 2022-10-13 ENCOUNTER — Encounter: Payer: Medicaid Other | Admitting: Physical Medicine & Rehabilitation

## 2022-10-13 DIAGNOSIS — F902 Attention-deficit hyperactivity disorder, combined type: Secondary | ICD-10-CM | POA: Diagnosis not present

## 2022-10-13 DIAGNOSIS — F4312 Post-traumatic stress disorder, chronic: Secondary | ICD-10-CM | POA: Diagnosis not present

## 2022-10-15 DIAGNOSIS — G8929 Other chronic pain: Secondary | ICD-10-CM | POA: Diagnosis not present

## 2022-10-15 DIAGNOSIS — J449 Chronic obstructive pulmonary disease, unspecified: Secondary | ICD-10-CM | POA: Diagnosis not present

## 2022-10-15 DIAGNOSIS — J42 Unspecified chronic bronchitis: Secondary | ICD-10-CM | POA: Diagnosis not present

## 2022-10-19 ENCOUNTER — Other Ambulatory Visit: Payer: Self-pay

## 2022-10-19 DIAGNOSIS — F17210 Nicotine dependence, cigarettes, uncomplicated: Secondary | ICD-10-CM

## 2022-10-19 DIAGNOSIS — Z87891 Personal history of nicotine dependence: Secondary | ICD-10-CM

## 2022-10-23 DIAGNOSIS — F902 Attention-deficit hyperactivity disorder, combined type: Secondary | ICD-10-CM | POA: Diagnosis not present

## 2022-10-23 DIAGNOSIS — F4312 Post-traumatic stress disorder, chronic: Secondary | ICD-10-CM | POA: Diagnosis not present

## 2022-10-26 ENCOUNTER — Encounter: Payer: Self-pay | Admitting: Surgery

## 2022-10-26 ENCOUNTER — Ambulatory Visit (HOSPITAL_COMMUNITY)
Admission: RE | Admit: 2022-10-26 | Discharge: 2022-10-26 | Disposition: A | Discharge status: Home / Self Care | Payer: Medicaid Other | Source: Ambulatory Visit | Attending: Vascular Surgery | Admitting: Vascular Surgery

## 2022-10-26 ENCOUNTER — Ambulatory Visit (INDEPENDENT_AMBULATORY_CARE_PROVIDER_SITE_OTHER): Payer: Medicaid Other | Admitting: Surgery

## 2022-10-26 VITALS — BP 97/67 | HR 79 | Temp 97.8°F | Resp 16 | Ht 66.0 in | Wt 166.0 lb

## 2022-10-26 DIAGNOSIS — I6523 Occlusion and stenosis of bilateral carotid arteries: Secondary | ICD-10-CM | POA: Diagnosis not present

## 2022-10-26 DIAGNOSIS — I779 Disorder of arteries and arterioles, unspecified: Secondary | ICD-10-CM | POA: Insufficient documentation

## 2022-10-26 NOTE — Progress Notes (Signed)
Vascular and Vein Specialist of [facility]  Patient name: Rachael Watson MRN: [id number] DOB: [birth year] Sex: female   REQUESTING PROVIDER:    Dr. Philis Nettle   REASON FOR CONSULT:    Carotid disease  HISTORY OF PRESENT ILLNESS:   Rachael Watson is a 57 y.o. female, who is referred for evaluation of disease.  She has a recent ultrasound that showed a 60 to 79% right-sided stenosis.  The patient has a myriad of symptoms.  She states that she has been having dizziness for 10 years.  She is also dealing with ringing in her ears.  She does take meclizine.  She has been scheduled to see ENT, but did not keep that appointment.  She tells me that she has had some left eye vision loss about 1 year ago but nothing since.  She has had some episodes of slurred speech but feels that these may be due to her anxiety or fatigue.  She has had bilateral leg weakness and numbness that has been going on for 20 years.  She does suffer from severe anxiety.  She recently had a sister suffer a stroke.  She has COPD secondary to tobacco abuse however she has been trying to quit.  She has a history of coronary artery disease.  She suffers from diabetes that is diet controlled.  She has a history of congestive heart failure.  She is medically managed for hypertension.  She takes a statin for hypercholesterolemia  PAST MEDICAL HISTORY    Past Medical History:  Diagnosis Date   Anxiety    Arthritis    Asthma    Chest pain, precordial 12/17/2017   CHF (congestive heart failure)    2009   COPD (chronic obstructive pulmonary disease)    Coronary artery disease    patient states history of blockage    Diabetes mellitus without complication     diet control 1 year ago   Fatty liver disease, nonalcoholic    GERD (gastroesophageal reflux disease)    Heart murmur    Pneumonia    Thyroid disease      FAMILY HISTORY   Family History  Problem Relation Age of Onset   Heart attack  Mother    Brain cancer Mother    Heart attack Father    Hypertension Father    Autism spectrum disorder Daughter    Down syndrome Cousin    Bipolar disorder Sister    Schizophrenia Maternal Aunt     SOCIAL HISTORY:   Social History   Socioeconomic History   Marital status: Divorced    Spouse name: Not on file   Number of children: Not on file   Years of education: Not on file   Highest education level: Not on file  Occupational History   Not on file  Tobacco Use   Smoking status: Every Day    Packs/day: 0.50    Years: 39.00    Additional pack years: 0.00    Total pack years: 19.50    Types: Cigarettes    Last attempt to quit: 09/03/2020    Years since quitting: 2.1   Smokeless tobacco: Former   Tobacco comments:    smokes 1/2 pack per day 07/03/22 [place]  Vaping Use   Vaping Use: Never used  Substance and Sexual Activity   Alcohol use: Yes    Comment: 3-4 x yearly   Drug use: No    Comment: last 2006   Sexual activity: Not on file  Other Topics Concern  Not on file  Social History Narrative   Lives home with 57yr old autistic daughter and 13 y old father.   She is disabled.  Education HS diploma,  Children 4.  Has boyfriend.  Caffeine every morning.    Social Determinants of Health   Financial Resource Strain: Not on file  Food Insecurity: Not on file  Transportation Needs: Not on file  Physical Activity: Not on file  Stress: Not on file  Social Connections: Not on file  Intimate Partner Violence: Not on file    ALLERGIES:    Allergies  Allergen Reactions   Abilify [Aripiprazole] Other (See Comments)    Violent behavior   Quetiapine Fumarate Other (See Comments)    Pt felt paralyzed    Risperidone Other (See Comments)    Unknown   Seroquel [Quetiapine] Other (See Comments)    Tremors and unable to swallow    CURRENT MEDICATIONS:    Current Outpatient Medications  Medication Sig Dispense Refill   albuterol (PROVENTIL) (2.5 MG/3ML) 0.083%  nebulizer solution Take by nebulization.     ammonium lactate (AMLACTIN) 12 % lotion Apply 1 Application topically as needed for dry skin. 400 g 0   atorvastatin (LIPITOR) 40 MG tablet TAKE ONE TABLET BY MOUTH EVERY DAY 90 tablet 1   Budeson-Glycopyrrol-Formoterol (BREZTRI AEROSPHERE) 160-9-4.8 MCG/ACT AERO Inhale 2 puffs into the lungs in the morning and at bedtime. 2 each 0   cyclobenzaprine (FLEXERIL) 10 MG tablet Take by mouth.     dexmethylphenidate (FOCALIN XR) 20 MG 24 hr capsule Take 20 mg by mouth daily.     fluticasone (FLONASE) 50 MCG/ACT nasal spray Place 2 sprays into both nostrils daily. 16 g 6   FOCALIN XR 30 MG CP24 Take 1 capsule by mouth every morning.     furosemide (LASIX) 20 MG tablet Take by mouth.     hydrOXYzine (ATARAX) 25 MG tablet Take 25 mg by mouth 3 (three) times daily as needed.     isosorbide mononitrate (IMDUR) 30 MG 24 hr tablet Take by mouth.     levothyroxine (SYNTHROID) 100 MCG tablet Take by mouth.     Levothyroxine Sodium (LEVOTHROID PO) Take by mouth.     LINZESS 290 MCG CAPS capsule TAKE 1 CAPSULE BY MOUTH ONCE DAILY BEFORE BREAKFAST 90 capsule 0   meclizine (ANTIVERT) 25 MG tablet Take by mouth.     methocarbamol (ROBAXIN) 500 MG tablet Take 500 mg by mouth 2 (two) times daily.     metoprolol tartrate (LOPRESSOR) 25 MG tablet Take by mouth.     Multiple Vitamin (MULTIVITAMIN ADULT PO) Take by mouth.     Nerve Stimulator (TENS THERAPY REPLACE BACK PADS) MISC 1 Application by Does not apply route daily as needed. 2 each 0   omeprazole (PRILOSEC) 20 MG capsule Take by mouth.     omeprazole (PRILOSEC) 40 MG capsule Take 1 capsule (40 mg total) by mouth daily. 90 capsule 3   oxyCODONE-acetaminophen (PERCOCET/ROXICET) 5-325 MG tablet 1 tablet every 6 (six) hours as needed.     pregabalin (LYRICA) 50 MG capsule Take 1 capsule (50 mg total) by mouth 3 (three) times daily. 90 capsule 2   PROAIR HFA 108 (90 Base) MCG/ACT inhaler INHALE 2 PUFFS BY MOUTH EVERY 6  HOURS AS NEEDED FOR WHEEZING FOR SHORTNESS OF BREATH 9 g 2   propranolol (INDERAL) 20 MG tablet Take 40 mg by mouth 2 (two) times daily.     sertraline (ZOLOFT) 50 MG tablet Take  50 mg by mouth daily.     VRAYLAR 3 MG capsule Take 3 mg by mouth daily.     VRAYLAR 4.5 MG CAPS Take 1 capsule by mouth daily.     XIIDRA 5 % SOLN      Calcium Carb-Cholecalciferol (CALCIUM 1000 + D PO) Take 1 tablet by mouth daily. (Patient not taking: Reported on 08/20/2022)     oxyCODONE (OXY IR/ROXICODONE) 5 MG immediate release tablet Take 5 mg by mouth 4 (four) times daily as needed. (Patient not taking: Reported on 10/26/2022)     No current facility-administered medications for this visit.    REVIEW OF SYSTEMS:   [X]  denotes positive finding, [ ]  denotes negative finding Cardiac  Comments:  Chest pain or chest pressure:    Shortness of breath upon exertion:    Short of breath when lying flat:    Irregular heart rhythm:        Vascular    Pain in calf, thigh, or hip brought on by ambulation:    Pain in feet at night that wakes you up from your sleep:     Blood clot in your veins:    Leg swelling:         Pulmonary    Oxygen at home:    Productive cough:     Wheezing:         Neurologic    Sudden weakness in arms or legs:  x   Sudden numbness in arms or legs:  x   Sudden onset of difficulty speaking or slurred speech: x   Temporary loss of vision in one eye:  x   Problems with dizziness:  x       Gastrointestinal    Blood in stool:      Vomited blood:         Genitourinary    Burning when urinating:     Blood in urine:        Psychiatric    Major depression:         Hematologic    Bleeding problems:    Problems with blood clotting too easily:        Skin    Rashes or ulcers:        Constitutional    Fever or chills:     PHYSICAL EXAM:   Vitals:   10/26/22 1156 10/26/22 1201  BP: 99/68 97/67  Pulse: 76 79  Resp: 16   Temp: 97.8 F (36.6 C)   TempSrc: Temporal   SpO2:  99%   Weight: 166 lb (75.3 kg)   Height: 5\' 6"  (1.676 m)     GENERAL: The patient is a well-nourished female, in no acute distress. The vital signs are documented above. CARDIAC: There is a regular rate and rhythm.  VASCULAR: No carotid bruits.  I could not palpate pedal pulses PULMONARY: Nonlabored respirations ABDOMEN: Soft and non-tender with normal pitched bowel sounds.  MUSCULOSKELETAL: There are no major deformities or cyanosis. NEUROLOGIC: No focal weakness or paresthesias are detected. SKIN: There are no ulcers or rashes noted. PSYCHIATRIC: The patient has a normal affect.  STUDIES:   I have reviewed the following carotid duplex: Right Carotid: Velocities in the right ICA are consistent with a 60-79%                 stenosis.   Left Carotid: Velocities in the left ICA are consistent with a 1-39%  stenosis.   Vertebrals: Bilateral vertebral arteries demonstrate antegrade flow.  ASSESSMENT and PLAN   Carotid: The patient has 60-79% right-sided stenosis.  She has multiple symptoms that I think are more likely explained by a inner ear issue.  She is trying to schedule an appointment with ENT to have this better evaluated.  At this time, I do not think that her carotid disease is symptomatic.  I would recommend surveillance imaging.  I will have her follow-up with me in 6 months.  I have encouraged her to continue to be abstinent from smoking.  She needs to be on a aspirin 81 mg   Charlena Cross, MD, FACS [facility] of [facility] [phone] Pager [phone]

## 2022-11-04 ENCOUNTER — Other Ambulatory Visit: Payer: Self-pay

## 2022-11-04 DIAGNOSIS — I6523 Occlusion and stenosis of bilateral carotid arteries: Secondary | ICD-10-CM

## 2022-11-12 ENCOUNTER — Ambulatory Visit: Payer: Medicaid Other | Admitting: Family

## 2022-11-13 ENCOUNTER — Encounter: Payer: Medicaid Other | Admitting: Physical Medicine & Rehabilitation

## 2022-11-19 ENCOUNTER — Ambulatory Visit: Payer: Medicaid Other | Admitting: Nurse Practitioner

## 2022-12-04 ENCOUNTER — Ambulatory Visit: Payer: Medicaid Other | Admitting: Nurse Practitioner

## 2022-12-09 ENCOUNTER — Ambulatory Visit (INDEPENDENT_AMBULATORY_CARE_PROVIDER_SITE_OTHER): Payer: Medicaid Other | Admitting: Acute Care

## 2022-12-09 ENCOUNTER — Encounter: Payer: Self-pay | Admitting: Acute Care

## 2022-12-09 DIAGNOSIS — F17210 Nicotine dependence, cigarettes, uncomplicated: Secondary | ICD-10-CM | POA: Diagnosis not present

## 2022-12-09 NOTE — Progress Notes (Signed)
Virtual Visit via Telephone Note  I connected with Rachael Watson on 12/09/22 at  9:00 AM EDT by telephone and verified that I am speaking with the correct person using two identifiers.  Location: Patient:  At home Provider:  [address] [place], [place], [address]    I discussed the limitations, risks, security and privacy concerns of performing an evaluation and management service by telephone and the availability of in person appointments. I also discussed with the patient that there may be a patient responsible charge related to this service. The patient expressed understanding and agreed to proceed.   Shared Decision Making Visit Lung Cancer Screening Program [phone])   Eligibility: Age 57 y.o. Pack Years Smoking History Calculation 88 pack year smoking history (# packs/per year x # years smoked) Recent History of coughing up blood  no Unexplained weight loss? no ( >Than 15 pounds within the last 6 months ) Prior History Lung / other cancer no (Diagnosis within the last 5 years already requiring surveillance chest CT Scans). Smoking Status Current Smoker Former Smokers: Years since quit:  NA  Quit Date:  NA  Visit Components: Discussion included one or more decision making aids. yes Discussion included risk/benefits of screening. yes Discussion included potential follow up diagnostic testing for abnormal scans. yes Discussion included meaning and risk of over diagnosis. yes Discussion included meaning and risk of False Positives. yes Discussion included meaning of total radiation exposure. yes  Counseling Included: Importance of adherence to annual lung cancer LDCT screening. yes Impact of comorbidities on ability to participate in the program. yes Ability and willingness to under diagnostic treatment. yes  Smoking Cessation Counseling: Current Smokers:  Discussed importance of smoking cessation. yes Information about tobacco cessation classes and  interventions provided to patient. yes Patient provided with "ticket" for LDCT Scan. yes Symptomatic Patient. no  Counseling NA Diagnosis Code: Tobacco Use Z72.0 Asymptomatic Patient yes  Counseling (Intermediate counseling: > three minutes counseling) U0454 Former Smokers:  Discussed the importance of maintaining cigarette abstinence. yes Diagnosis Code: Personal History of Nicotine Dependence. U98.119 Information about tobacco cessation classes and interventions provided to patient. Yes Patient provided with "ticket" for LDCT Scan. yes Written Order for Lung Cancer Screening with LDCT placed in Epic. Yes (CT Chest Lung Cancer Screening Low Dose W/O CM) [id number] Z12.2-Screening of respiratory organs Z87.891-Personal history of nicotine dependence  I have spent 25 minutes of face to face/ virtual visit   time with  Rachael Watson discussing the risks and benefits of lung cancer screening. We viewed / discussed a power point together that explained in detail the above noted topics. We paused at intervals to allow for questions to be asked and answered to ensure understanding.We discussed that the single most powerful action that she can take to decrease her risk of developing lung cancer is to quit smoking. We discussed whether or not she is ready to commit to setting a quit date. We discussed options for tools to aid in quitting smoking including nicotine replacement therapy, non-nicotine medications, support groups, Quit Smart classes, and behavior modification. We discussed that often times setting smaller, more achievable goals, such as eliminating 1 cigarette a day for a week and then 2 cigarettes a day for a week can be helpful in slowly decreasing the number of cigarettes smoked. This allows for a sense of accomplishment as well as providing a clinical benefit. I provided  her  with smoking cessation  information  with contact information for community resources, classes,  free nicotine replacement  therapy, and access to mobile apps, text messaging, and on-line smoking cessation help. I have also provided  her  the office contact information in the event she needs to contact me, or the screening staff. We discussed the time and location of the scan, and that either Rachael Miyamoto RN, Rachael Lemon, RN  or I will call / send a letter with the results within 24-72 hours of receiving them. The patient verbalized understanding of all of  the above and had no further questions upon leaving the office. They have my contact information in the event they have any further questions.  I spent 3-4 minutes counseling on smoking cessation and the health risks of continued tobacco abuse.  I explained to the patient that there has been a high incidence of coronary artery disease noted on these exams. I explained that this is a non-gated exam therefore degree or severity cannot be determined. This patient is on statin therapy. I have asked the patient to follow-up with their PCP regarding any incidental finding of coronary artery disease and management with diet or medication as their PCP  feels is clinically indicated. The patient verbalized understanding of the above and had no further questions upon completion of the visit.      Bevelyn Ngo, NP 12/09/2022

## 2022-12-09 NOTE — Patient Instructions (Signed)
Thank you for participating in the [facility] Lung Cancer Screening Program. It was our pleasure to meet you today. We will call you with the results of your scan within the next few days. Your scan will be assigned a Lung RADS category score by the physicians reading the scans.  This Lung RADS score determines follow up scanning.  See below for description of categories, and follow up screening recommendations. We will be in touch to schedule your follow up screening annually or based on recommendations of our providers. We will fax a copy of your scan results to your Primary Care Physician, or the physician who referred you to the program, to ensure they have the results. Please call the office if you have any questions or concerns regarding your scanning experience or results.  Our office number is [phone]. Please speak with Denise Phelps, RN. , or  Denise Buckner RN, They are  our Lung Cancer Screening RN.'s If They are unavailable when you call, Please leave a message on the voice mail. We will return your call at our earliest convenience.This voice mail is monitored several times a day.  Remember, if your scan is normal, we will scan you annually as long as you continue to meet the criteria for the program. (Age 50-80, Current smoker or smoker who has quit within the last 15 years). If you are a smoker, remember, quitting is the single most powerful action that you can take to decrease your risk of lung cancer and other pulmonary, breathing related problems. We know quitting is hard, and we are here to help.  Please let us know if there is anything we can do to help you meet your goal of quitting. If you are a former smoker, congratulations. We are proud of you! Remain smoke free! Remember you can refer friends or family members through the number above.  We will screen them to make sure they meet criteria for the program. Thank you for helping us take better care of you by  participating in Lung Screening.  You can receive free nicotine replacement therapy ( patches, gum or mints) by calling 1-800-QUIT NOW. Please call so we can get you on the path to becoming  a non-smoker. I know it is hard, but you can do this!  Lung RADS Categories:  Lung RADS 1: no nodules or definitely non-concerning nodules.  Recommendation is for a repeat annual scan in 12 months.  Lung RADS 2:  nodules that are non-concerning in appearance and behavior with a very low likelihood of becoming an active cancer. Recommendation is for a repeat annual scan in 12 months.  Lung RADS 3: nodules that are probably non-concerning , includes nodules with a low likelihood of becoming an active cancer.  Recommendation is for a 6-month repeat screening scan. Often noted after an upper respiratory illness. We will be in touch to make sure you have no questions, and to schedule your 6-month scan.  Lung RADS 4 A: nodules with concerning findings, recommendation is most often for a follow up scan in 3 months or additional testing based on our provider's assessment of the scan. We will be in touch to make sure you have no questions and to schedule the recommended 3 month follow up scan.  Lung RADS 4 B:  indicates findings that are concerning. We will be in touch with you to schedule additional diagnostic testing based on our provider's  assessment of the scan.  Other options for assistance in smoking cessation (   As covered by your insurance benefits)  Hypnosis for smoking cessation  [facility] [employer] [phone]  Acupuncture for smoking cessation  [facility] [phone]   

## 2022-12-15 ENCOUNTER — Ambulatory Visit (HOSPITAL_COMMUNITY): Payer: Medicaid Other

## 2022-12-22 ENCOUNTER — Ambulatory Visit (HOSPITAL_COMMUNITY): Admission: RE | Admit: 2022-12-22 | Payer: Medicaid Other | Source: Ambulatory Visit

## 2022-12-28 ENCOUNTER — Ambulatory Visit: Payer: Medicaid Other | Admitting: Nurse Practitioner

## 2023-03-10 ENCOUNTER — Other Ambulatory Visit: Payer: Self-pay | Admitting: Cardiology

## 2023-03-10 ENCOUNTER — Telehealth: Payer: Self-pay

## 2023-03-10 NOTE — Telephone Encounter (Signed)
*  STAT* If patient is at the pharmacy, call can be transferred to refill team.   1. Which medications need to be refilled? (please list name of each medication and dose if known) isosorbide mononitrate (IMDUR) 30 MG 24 hr tablet    4. Which pharmacy/location (including street and city if local pharmacy) is medication to be sent to?[facility] - [place], [place] - [address]    5. Do they need a 30 day or 90 day supply? 90

## 2023-03-10 NOTE — Telephone Encounter (Signed)
Patient is requesting a refill on this medication. She has been taking a 1/2 tablet daily. She mentioned that she is going to ENT doctor due to her vertigo and wanted to know if isosorbide would keep her blood thin enough to keep her from passing out. Please advise. I have attached the refill to this encounter as well.

## 2023-03-10 NOTE — Telephone Encounter (Signed)
Pt called c/o left arm issues and she keeps trying to reach an ENT, but the line just rings and rings.  Reviewed pt's chart, returned call for clarification, two identifiers used. Pt stated that she finally got through and has an appt on 10/1. She has been worried that the arm pain from her elbow to her hand is connected to the carotid stenosis. Reassured pt and reviewed stroke-like symptoms. Instructed her to check with her PCP or orthopedist concerning probable musculoskeletal problem. Confirmed understanding.

## 2023-04-29 ENCOUNTER — Ambulatory Visit: Payer: Medicaid Other | Admitting: Nurse Practitioner

## 2023-05-03 ENCOUNTER — Ambulatory Visit (HOSPITAL_COMMUNITY): Payer: Medicaid Other

## 2023-05-03 ENCOUNTER — Ambulatory Visit: Payer: Medicaid Other | Admitting: Surgery

## 2023-05-24 ENCOUNTER — Ambulatory Visit: Payer: Medicaid Other | Attending: Nurse Practitioner

## 2023-06-02 ENCOUNTER — Ambulatory Visit: Payer: Medicaid Other | Admitting: Nurse Practitioner
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
## 2023-07-05 ENCOUNTER — Encounter: Payer: Self-pay | Admitting: *Deleted

## 2023-07-19 ENCOUNTER — Ambulatory Visit (HOSPITAL_COMMUNITY): Payer: Medicaid Other

## 2023-07-19 ENCOUNTER — Ambulatory Visit: Payer: Medicaid Other | Admitting: Surgery

## 2023-07-29 DIAGNOSIS — F439 Reaction to severe stress, unspecified: Secondary | ICD-10-CM | POA: Diagnosis not present

## 2023-08-02 ENCOUNTER — Telehealth: Payer: Self-pay

## 2023-08-02 ENCOUNTER — Telehealth: Payer: Self-pay | Admitting: Cardiology

## 2023-08-02 MED ORDER — ISOSORBIDE MONONITRATE ER 30 MG PO TB24: 30.0000 mg | ORAL_TABLET | Freq: Every day | ORAL | 0 refills | Status: DC

## 2023-08-02 NOTE — Telephone Encounter (Signed)
*  STAT* If patient is at the pharmacy, call can be transferred to refill team.   1. Which medications need to be refilled? (please list name of each medication and dose if known) isosorbide mononitrate (IMDUR) 30 MG 24 hr tablet    2. Would you like to learn more about the convenience, safety, & potential cost savings by using the [facility]? No   3. Are you open to using the [facility] No   4. Which pharmacy/location (including street and city if local pharmacy) is medication to be sent to?[facility] - [place], [place] - [address]    5. Do they need a 30 day or 90 day supply? 90 Day Supply

## 2023-08-02 NOTE — Telephone Encounter (Signed)
Pt called about making follow up appt and talk to the nurse about her symptoms.  Pt describes symptoms of her arms and legs being numb or feels like she is "losing the feeling", weakness like they are going to give out, and swelling in her feet.  She states that she is not taking her cholesterol medication for approximately 6 months or isosorbide for her chest tightness.   On chart review, pt saw Dr. Myra Gianotti April 2024 for Carotid stenosis and not for her legs.  She confirms she has back problems and does have a cardiologist.  I advised her to talk with her PCP further about any needed referrals and follow-ups for her weakness, swelling and numbness in her legs. She does have her follow-up appointment for next week schedule with carotid duplex's.   She confirms understanding if she has any signs and symptoms of a stroke, she should call 911.

## 2023-08-02 NOTE — Telephone Encounter (Signed)
Filled for 30 days patient needs appointment 1st attempt

## 2023-08-08 DIAGNOSIS — I959 Hypotension, unspecified: Secondary | ICD-10-CM | POA: Diagnosis not present

## 2023-08-08 DIAGNOSIS — N3000 Acute cystitis without hematuria: Secondary | ICD-10-CM | POA: Diagnosis not present

## 2023-08-08 DIAGNOSIS — M5416 Radiculopathy, lumbar region: Secondary | ICD-10-CM | POA: Diagnosis not present

## 2023-08-08 DIAGNOSIS — M5442 Lumbago with sciatica, left side: Secondary | ICD-10-CM | POA: Diagnosis not present

## 2023-08-08 DIAGNOSIS — M5441 Lumbago with sciatica, right side: Secondary | ICD-10-CM | POA: Diagnosis not present

## 2023-08-08 DIAGNOSIS — M549 Dorsalgia, unspecified: Secondary | ICD-10-CM | POA: Diagnosis not present

## 2023-08-09 ENCOUNTER — Ambulatory Visit: Payer: Medicaid Other | Admitting: Surgery

## 2023-08-09 ENCOUNTER — Ambulatory Visit (HOSPITAL_COMMUNITY): Payer: Medicaid Other

## 2023-08-12 DIAGNOSIS — R5383 Other fatigue: Secondary | ICD-10-CM | POA: Diagnosis not present

## 2023-08-12 DIAGNOSIS — Z13220 Encounter for screening for lipoid disorders: Secondary | ICD-10-CM | POA: Diagnosis not present

## 2023-08-12 DIAGNOSIS — Z0000 Encounter for general adult medical examination without abnormal findings: Secondary | ICD-10-CM | POA: Diagnosis not present

## 2023-08-12 DIAGNOSIS — E559 Vitamin D deficiency, unspecified: Secondary | ICD-10-CM | POA: Diagnosis not present

## 2023-08-12 DIAGNOSIS — M129 Arthropathy, unspecified: Secondary | ICD-10-CM | POA: Diagnosis not present

## 2023-08-12 DIAGNOSIS — R875 Abnormal microbiological findings in specimens from female genital organs: Secondary | ICD-10-CM | POA: Diagnosis not present

## 2023-08-12 DIAGNOSIS — Z131 Encounter for screening for diabetes mellitus: Secondary | ICD-10-CM | POA: Diagnosis not present

## 2023-08-12 DIAGNOSIS — R87610 Atypical squamous cells of undetermined significance on cytologic smear of cervix (ASC-US): Secondary | ICD-10-CM | POA: Diagnosis not present

## 2023-08-12 DIAGNOSIS — Z79899 Other long term (current) drug therapy: Secondary | ICD-10-CM | POA: Diagnosis not present

## 2023-08-12 LAB — CYTOLOGY - PAP

## 2023-08-13 DIAGNOSIS — M79671 Pain in right foot: Secondary | ICD-10-CM | POA: Diagnosis not present

## 2023-08-13 DIAGNOSIS — S93401A Sprain of unspecified ligament of right ankle, initial encounter: Secondary | ICD-10-CM | POA: Diagnosis not present

## 2023-08-13 DIAGNOSIS — S93601A Unspecified sprain of right foot, initial encounter: Secondary | ICD-10-CM | POA: Diagnosis not present

## 2023-08-16 DIAGNOSIS — Z79899 Other long term (current) drug therapy: Secondary | ICD-10-CM | POA: Diagnosis not present

## 2023-08-19 ENCOUNTER — Encounter: Payer: Self-pay | Admitting: Podiatry

## 2023-08-19 ENCOUNTER — Ambulatory Visit (INDEPENDENT_AMBULATORY_CARE_PROVIDER_SITE_OTHER): Payer: Medicaid Other | Admitting: Podiatry

## 2023-08-19 DIAGNOSIS — M216X2 Other acquired deformities of left foot: Secondary | ICD-10-CM | POA: Diagnosis not present

## 2023-08-19 DIAGNOSIS — E1149 Type 2 diabetes mellitus with other diabetic neurological complication: Secondary | ICD-10-CM

## 2023-08-19 DIAGNOSIS — B351 Tinea unguium: Secondary | ICD-10-CM

## 2023-08-19 DIAGNOSIS — M79674 Pain in right toe(s): Secondary | ICD-10-CM

## 2023-08-19 DIAGNOSIS — M79675 Pain in left toe(s): Secondary | ICD-10-CM | POA: Diagnosis not present

## 2023-08-19 DIAGNOSIS — M216X1 Other acquired deformities of right foot: Secondary | ICD-10-CM

## 2023-08-19 DIAGNOSIS — Z79899 Other long term (current) drug therapy: Secondary | ICD-10-CM

## 2023-08-19 MED ORDER — AMMONIUM LACTATE 12 % EX CREA: 1.0000 | TOPICAL_CREAM | CUTANEOUS | 0 refills | Status: AC | PRN

## 2023-08-19 MED ORDER — PREGABALIN 150 MG PO CAPS: 150.0000 mg | ORAL_CAPSULE | Freq: Two times a day (BID) | ORAL | 0 refills | Status: AC

## 2023-08-19 NOTE — Patient Instructions (Signed)
Pregabalin Capsules What is this medication? PREGABALIN (pre GAB a lin) treats nerve pain. It may also be used to prevent and control seizures in people with epilepsy. It works by calming overactive nerves in your body. This medicine may be used for other purposes; ask your health care provider or pharmacist if you have questions. COMMON BRAND NAME(S): Lyrica What should I tell my care team before I take this medication? They need to know if you have any of these conditions: Heart failure Kidney disease Lung disease Substance use disorder Suicidal thoughts, plans or attempt by you or a family member An unusual or allergic reaction to pregabalin, other medications, foods, dyes, or preservatives Pregnant or trying to get pregnant Breast-feeding How should I use this medication? Take this medication by mouth with water. Take it as directed on the prescription label at the same time every day. You can take it with or without food. If it upsets your stomach, take it with food. Keep taking it unless your care team tells you to stop. A special MedGuide will be given to you by the pharmacist with each prescription and refill. Be sure to read this information carefully each time. Talk to your care team about the use of this medication in children. While it may be prescribed for children as young as 1 month for selected conditions, precautions do apply. Overdosage: If you think you have taken too much of this medicine contact a poison control center or emergency room at once. NOTE: This medicine is only for you. Do not share this medicine with others. What if I miss a dose? If you miss a dose, take it as soon as you can. If it is almost time for your next dose, take only that dose. Do not take double or extra doses. What may interact with this medication? This medication may interact with the following: Alcohol Antihistamines for allergy, cough, and cold Certain medications for anxiety or  sleep Certain medications for blood pressure, heart disease Certain medications for depression like amitriptyline, fluoxetine, sertraline Certain medications for diabetes, like pioglitazone, rosiglitazone Certain medications for seizures like phenobarbital, primidone General anesthetics like halothane, isoflurane, methoxyflurane, propofol Medications that relax muscles for surgery Opioid medications for pain Phenothiazines like chlorpromazine, mesoridazine, prochlorperazine, thioridazine This list may not describe all possible interactions. Give your health care provider a list of all the medicines, herbs, non-prescription drugs, or dietary supplements you use. Also tell them if you smoke, drink alcohol, or use illegal drugs. Some items may interact with your medicine. What should I watch for while using this medication? Visit your care team for regular checks on your progress. Tell your care team if your symptoms do not start to get better or if they get worse. Do not suddenly stop taking this medication. You may develop a severe reaction. Your care team will tell you how much medication to take. If your care team wants you to stop the medication, the dose may be slowly lowered over time to avoid any side effects. This medication may affect your coordination, reaction time, or judgment. Do not drive or operate machinery until you know how this medication affects you. Sit up or stand slowly to reduce the risk of dizzy or fainting spells. Drinking alcohol with this medication can increase the risk of these side effects. If you or your family notice any changes in your behavior, such as new or worsening depression, thoughts of harming yourself, anxiety, other unusual or disturbing thoughts, or memory loss, call your  care team right away. Wear a medical ID bracelet or chain if you are taking this medication for seizures. Carry a card that describes your condition. List the medications and doses you take  on the card. This medication may make it more difficult to father a child. Talk to your care team if you are concerned about your fertility. What side effects may I notice from receiving this medication? Side effects that you should report to your care team as soon as possible: Allergic reactions or angioedema--skin rash, itching, hives, swelling of the face, eyes, lips, tongue, arms, or legs, trouble swallowing or breathing Blurry vision Thoughts of suicide or self-harm, worsening mood, feelings of depression Trouble breathing Side effects that usually do not require medical attention (report to your care team if they continue or are bothersome): Dizziness Drowsiness Dry mouth Nausea Swelling of the ankles, feet, hands Vomiting Weight gain This list may not describe all possible side effects. Call your doctor for medical advice about side effects. You may report side effects to FDA at 1-800-FDA-1088. Where should I keep my medication? Keep out of the reach of children and pets. This medication can be abused. Keep it in a safe place to protect it from theft. Do not share it with anyone. It is only for you. Selling or giving away this medication is dangerous and against the law. Store at [employer] [address] (77 degrees F). Get rid of any unused medication after the expiration date. This medication may cause harm and death if it is taken by other adults, children, or pets. It is important to get rid of the medication as soon as you no longer need it, or it is expired. You can do this in two ways: Take the medication to a medication take-back program. Check with your pharmacy or law enforcement to find a location. If you cannot return the medication, check the label or package insert to see if the medication should be thrown out in the garbage or flushed down the toilet. If you are not sure, ask your care team. If it is safe to put it in the trash, take the medication out of the container. Mix the  medication with cat litter, dirt, coffee grounds, or other unwanted substance. Seal the mixture in a bag or container. Put it in the trash. NOTE: This sheet is a summary. It may not cover all possible information. If you have questions about this medicine, talk to your doctor, pharmacist, or health care provider.  2024 Elsevier/Gold Standard (2021-03-25 00:00:00)Diabetes Mellitus and Foot Care Diabetes, also called diabetes mellitus, may cause problems with your feet and legs because of poor blood flow (circulation). Poor circulation may make your skin: Become thinner and drier. Break more easily. Heal more slowly. Peel and crack. You may also have nerve damage (neuropathy). This can cause decreased feeling in your legs and feet. This means that you may not notice minor injuries to your feet that could lead to more serious problems. Finding and treating problems early is the best way to prevent future foot problems. How to care for your feet Foot hygiene  Wash your feet daily with warm water and mild soap. Do not use hot water. Then, pat your feet and the areas between your toes until they are fully dry. Do not soak your feet. This can dry your skin. Trim your toenails straight across. Do not dig under them or around the cuticle. File the edges of your nails with an emery board or nail  file. Apply a moisturizing lotion or petroleum jelly to the skin on your feet and to dry, brittle toenails. Use lotion that does not contain alcohol and is unscented. Do not apply lotion between your toes. Shoes and socks Wear clean socks or stockings every day. Make sure they are not too tight. Do not wear knee-high stockings. These may decrease blood flow to your legs. Wear shoes that fit well and have enough cushioning. Always look in your shoes before you put them on to be sure there are no objects inside. To break in new shoes, wear them for just a few hours a day. This prevents injuries on your feet. Wounds,  scrapes, corns, and calluses  Check your feet daily for blisters, cuts, bruises, sores, and redness. If you cannot see the bottom of your feet, use a mirror or ask someone for help. Do not cut off corns or calluses or try to remove them with medicine. If you find a minor scrape, cut, or break in the skin on your feet, keep it and the skin around it clean and dry. You may clean these areas with mild soap and water. Do not clean the area with peroxide, alcohol, or iodine. If you have a wound, scrape, corn, or callus on your foot, look at it several times a day to make sure it is healing and not infected. Check for: Redness, swelling, or pain. Fluid or blood. Warmth. Pus or a bad smell. General tips Do not cross your legs. This may decrease blood flow to your feet. Do not use heating pads or hot water bottles on your feet. They may burn your skin. If you have lost feeling in your feet or legs, you may not know this is happening until it is too late. Protect your feet from hot and cold by wearing shoes, such as at the beach or on hot pavement. Schedule a complete foot exam at least once a year or more often if you have foot problems. Report any cuts, sores, or bruises to your health care provider right away. Where to find more information American Diabetes Association: diabetes.org Association of Diabetes Care & Education Specialists: diabeteseducator.org Contact a health care provider if: You have a condition that increases your risk of infection, and you have any cuts, sores, or bruises on your feet. You have an injury that is not healing. You have redness on your legs or feet. You feel burning or tingling in your legs or feet. You have pain or cramps in your legs and feet. Your legs or feet are numb. Your feet always feel cold. You have pain around any toenails. Get help right away if: You have a wound, scrape, corn, or callus on your foot and: You have signs of infection. You have a  fever. You have a red line going up your leg. This information is not intended to replace advice given to you by your health care provider. Make sure you discuss any questions you have with your health care provider. Document Revised: 12/24/2021 Document Reviewed: 12/24/2021 Elsevier Patient Education  2024 [employer].
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
## 2023-08-20 ENCOUNTER — Telehealth: Payer: Self-pay

## 2023-08-20 NOTE — Telephone Encounter (Signed)
-----   Message from Vivi Barrack sent at 08/19/2023 12:35 PM EST ----- Can you please send my last clinic note to her PCP?  Thank you.

## 2023-08-20 NOTE — Telephone Encounter (Signed)
Most recent office note faxed to Dr. Wynelle Link

## 2023-08-27 DIAGNOSIS — J42 Unspecified chronic bronchitis: Secondary | ICD-10-CM | POA: Diagnosis not present

## 2023-08-27 DIAGNOSIS — J449 Chronic obstructive pulmonary disease, unspecified: Secondary | ICD-10-CM | POA: Diagnosis not present

## 2023-08-27 DIAGNOSIS — G8929 Other chronic pain: Secondary | ICD-10-CM | POA: Diagnosis not present

## 2023-08-28 DIAGNOSIS — B349 Viral infection, unspecified: Secondary | ICD-10-CM | POA: Diagnosis not present

## 2023-08-28 DIAGNOSIS — S39012A Strain of muscle, fascia and tendon of lower back, initial encounter: Secondary | ICD-10-CM | POA: Diagnosis not present

## 2023-08-28 DIAGNOSIS — G8929 Other chronic pain: Secondary | ICD-10-CM | POA: Diagnosis not present

## 2023-09-01 ENCOUNTER — Other Ambulatory Visit (INDEPENDENT_AMBULATORY_CARE_PROVIDER_SITE_OTHER): Payer: Self-pay

## 2023-09-01 ENCOUNTER — Ambulatory Visit: Payer: Medicaid Other | Admitting: Orthopedic Surgery

## 2023-09-01 VITALS — BP 151/74 | HR 70 | Ht 66.0 in | Wt 164.0 lb

## 2023-09-01 DIAGNOSIS — M4712 Other spondylosis with myelopathy, cervical region: Secondary | ICD-10-CM

## 2023-09-01 DIAGNOSIS — M48061 Spinal stenosis, lumbar region without neurogenic claudication: Secondary | ICD-10-CM

## 2023-09-01 DIAGNOSIS — G8929 Other chronic pain: Secondary | ICD-10-CM | POA: Diagnosis not present

## 2023-09-01 DIAGNOSIS — M545 Low back pain, unspecified: Secondary | ICD-10-CM

## 2023-09-01 DIAGNOSIS — M5412 Radiculopathy, cervical region: Secondary | ICD-10-CM | POA: Diagnosis not present

## 2023-09-02 NOTE — Progress Notes (Signed)
 Orthopedic Spine Surgery Office Note  Assessment: Patient is a 58 y.o. female with feelings of unsteadiness with gait and clumsiness with her hands.  Possible myelopathy   Plan: -Patient was pan positive on my review of systems form.  She also complains of multiple other symptoms that were not on the review of systems such as drooling and forgetfulness.  If she does end up having cervical stenosis, we will have to spend some time going over expectations after surgery. -I did explain today that some of her symptoms are not consistent with spine pathology, such as drooling on herself and her forgetfulness -Patient has tried Tylenol, activity modification -Recommended MRI of the cervical spine to evaluate for myelopathy -Referred her to pain management for her chronic low back pain -Would need to be nicotine free prior to any elective spine surgery -Patient should return to office in 4 weeks, x-rays at next visit: AP/lateral/flex/ex cervical   Patient expressed understanding of the plan and all questions were answered to the patient's satisfaction.   ___________________________________________________________________________   History:  Patient is a 59 y.o. female who presents today for cervical and lumbar spine.  Patient comes in today complaining of many symptoms.  She says that she has forgetfulness, she has been drooling on herself, and numbness and paresthesias, difficulty with fine motor skills in the hand, weakness, bowel and bladder incontinence, and unsteadiness with gait.  She says she has a long history of low back pain.  Her back pain has gotten worse recently.  She does not have any pain radiating into either lower extremity.  She states that she feels the back pain on a daily basis.  She feels it with activity and rest.  She is also reporting neck pain that radiates into bilateral shoulders.  She has noticed numbness and paresthesias in her hands bilaterally.  She says that she has  difficulty with fine motor skills in the hands and is dropping objects.  She has trouble buttoning shirts.  She also reports unsteadiness with gait.  She feels off balance.  She said sometimes it feels like her brain is telling her legs to do something but they will not respond.  She has not had any falls or required any assistive devices.     ROS: Reports pain that wakes her at night, unplanned weight loss, night sweats, fevers, chills, bowel and bladder incontinence, numbness in the groin, weakness, feeling off balance, clumsiness with her hands -No bowel/bladder incontinence has been going on for over 6 weeks now so not acute   Past medical history: HLD Depression/anxiety CAD GERD Neuropathy Chronic pain History of migraines COPD  Allergies: Abilify, quetiapine, risperidone, Seroquel  Past surgical history:  C section Tubal ligation Right foot calcaneal fracture ORIF with arthrodesis  Social history: Reports use of nicotine product (smoking, vaping, patches, smokeless) Alcohol use: denies Denies recreational drug use   Physical Exam:  BMI of 26.5  General: no acute distress, appears stated age Neurologic: alert, answering questions appropriately, following commands Respiratory: unlabored breathing on room air, symmetric chest rise Psychiatric: appropriate affect, normal cadence to speech   MSK (spine):  -Strength exam      Left  Right Grip strength                5/5  5/5 Interosseus   5/5   5/5 Wrist extension  5/5  5/5 Wrist flexion   5/5  5/5 Elbow flexion   5/5  5/5 Deltoid    5/5  5/5  EHL    5/5  5/5 TA    5/5  5/5 GSC    5/5  5/5 Knee extension  5/5  5/5 Hip flexion   5/5  5/5  -Sensory exam    Sensation intact to light touch in L3-S1 nerve distributions of bilateral lower extremities  -Achilles DTR: 1/4 on the left, 1/4 on the right -Patellar tendon DTR: 1/4 on the left, 1/4 on the right -Biceps DTR: 1/4 on the left, 1/4 on the  right -Brachioradialis DTR: 1/4 on the left, 1/4 on the right  -Straight leg raise: Negative bilaterally -Femoral nerve stretch test: Negative bilaterally -Clonus: no beats bilaterally -Negative Hoffmann bilaterally -Negative grip and release test -Negative Romberg  -Left hip exam: No pain through range of motion -Right hip exam: No pain to range of motion  Imaging: XRs of the lumbar spine from 09/01/2023 were independently reviewed and interpreted, showing disc height loss at L4/5.  No other significant degenerative changes.  No evidence of instability on flexion/extension views.  No fracture or dislocation seen.  CT of the lumbar spine from 08/08/2023 was independently reviewed and interpreted, showing no fracture or dislocation.  Disc bulge at L4/5 can be seen.  No spondylolisthesis seen.   Patient name: Rachael Watson Patient MRN: [id number] Date of visit: 09/01/2023

## 2023-09-07 DIAGNOSIS — Z79899 Other long term (current) drug therapy: Secondary | ICD-10-CM | POA: Diagnosis not present

## 2023-09-13 ENCOUNTER — Ambulatory Visit: Payer: Medicaid Other | Admitting: Nurse Practitioner

## 2023-09-13 DIAGNOSIS — R051 Acute cough: Secondary | ICD-10-CM | POA: Diagnosis not present

## 2023-09-13 DIAGNOSIS — Z20822 Contact with and (suspected) exposure to covid-19: Secondary | ICD-10-CM | POA: Diagnosis not present

## 2023-09-13 DIAGNOSIS — Z720 Tobacco use: Secondary | ICD-10-CM | POA: Diagnosis not present

## 2023-09-13 DIAGNOSIS — J101 Influenza due to other identified influenza virus with other respiratory manifestations: Secondary | ICD-10-CM | POA: Diagnosis not present

## 2023-09-13 DIAGNOSIS — R059 Cough, unspecified: Secondary | ICD-10-CM | POA: Diagnosis not present
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
## 2023-09-15 ENCOUNTER — Ambulatory Visit: Payer: Medicaid Other | Admitting: Nurse Practitioner

## 2023-09-20 ENCOUNTER — Ambulatory Visit: Payer: Medicaid Other | Admitting: Surgery

## 2023-09-20 ENCOUNTER — Encounter (HOSPITAL_COMMUNITY): Payer: Medicaid Other

## 2023-09-27 ENCOUNTER — Institutional Professional Consult (permissible substitution): Admitting: Plastic Surgery

## 2023-10-04 ENCOUNTER — Encounter (HOSPITAL_COMMUNITY): Payer: Medicaid Other

## 2023-10-04 ENCOUNTER — Ambulatory Visit: Payer: Medicaid Other | Admitting: Surgery

## 2023-10-06 ENCOUNTER — Ambulatory Visit: Payer: Medicaid Other | Admitting: Orthopedic Surgery

## 2023-10-09 ENCOUNTER — Inpatient Hospital Stay: Admission: RE | Admit: 2023-10-09 | Source: Ambulatory Visit

## 2023-10-12 ENCOUNTER — Encounter: Admitting: Obstetrics & Gynecology

## 2023-10-14 ENCOUNTER — Encounter: Admitting: Obstetrics & Gynecology

## 2023-10-14 ENCOUNTER — Encounter: Payer: Self-pay | Admitting: Obstetrics & Gynecology

## 2023-10-14 NOTE — Progress Notes (Signed)
 This encounter was created in error - please disregard.

## 2023-10-28 ENCOUNTER — Other Ambulatory Visit: Payer: Self-pay | Admitting: Podiatry

## 2023-10-28 ENCOUNTER — Institutional Professional Consult (permissible substitution): Admitting: Plastic Surgery

## 2023-10-28 DIAGNOSIS — Z79899 Other long term (current) drug therapy: Secondary | ICD-10-CM

## 2023-10-28 MED ORDER — TERBINAFINE HCL 250 MG PO TABS: 250.0000 mg | ORAL_TABLET | Freq: Every day | ORAL | 0 refills | Status: AC

## 2023-10-28 NOTE — Progress Notes (Signed)
 Received email with a copy of her blood work (I didn't see the email previously). I have sent Lamisil  to the pharmacy and would like to get repeat blood work in 4 weeks, order placed for [facility]. Message sent back to call patient.

## 2023-11-04 ENCOUNTER — Telehealth: Payer: Self-pay | Admitting: Podiatry

## 2023-11-04 NOTE — Telephone Encounter (Signed)
 Attempted call for pt to reschedule 5/15 appt, unable to LVM due to mailbox being full.

## 2023-11-09 ENCOUNTER — Encounter: Payer: Self-pay | Admitting: Student

## 2023-11-09 ENCOUNTER — Ambulatory Visit: Admitting: Nurse Practitioner

## 2023-11-12 ENCOUNTER — Other Ambulatory Visit: Payer: Self-pay | Admitting: *Deleted

## 2023-11-12 DIAGNOSIS — I6523 Occlusion and stenosis of bilateral carotid arteries: Secondary | ICD-10-CM

## 2023-11-18 ENCOUNTER — Ambulatory Visit: Payer: Medicaid Other | Admitting: Podiatry

## 2023-11-22 ENCOUNTER — Ambulatory Visit (HOSPITAL_COMMUNITY): Admission: RE | Admit: 2023-11-22 | Payer: MEDICAID | Source: Ambulatory Visit

## 2023-11-22 ENCOUNTER — Ambulatory Visit: Payer: MEDICAID | Admitting: Surgery

## 2024-03-03 ENCOUNTER — Telehealth: Payer: Self-pay | Admitting: Cardiology

## 2024-03-03 MED ORDER — ISOSORBIDE MONONITRATE ER 30 MG PO TB24: 30.0000 mg | ORAL_TABLET | Freq: Every day | ORAL | 1 refills | Status: AC

## 2024-03-03 NOTE — Telephone Encounter (Signed)
 Pt's medication was sent to pt's pharmacy as requested. Confirmation received.

## 2024-03-03 NOTE — Telephone Encounter (Signed)
*  STAT* If patient is at the pharmacy, call can be transferred to refill team.   1. Which medications need to be refilled? (please list name of each medication and dose if known)   isosorbide  mononitrate (IMDUR ) 30 MG 24 hr tablet   2. Would you like to learn more about the convenience, safety, & potential cost savings by using the [facility]?   3. Are you open to using the [facility] (Type [facility]. ).  4. Which pharmacy/location (including street and city if local pharmacy) is medication to be sent to?  [facility] - [place], [place] - [address]   5. Do they need a 30 day or 90 day supply?   Patient stated she is completely out of this medication.  Patient has appointment scheduled on 10/24 with Rachael Crate, NP.

## 2024-03-08 ENCOUNTER — Ambulatory Visit: Payer: MEDICAID | Admitting: Orthopedic Surgery

## 2024-04-03 ENCOUNTER — Telehealth: Payer: Self-pay

## 2024-04-03 NOTE — Telephone Encounter (Signed)
 Called pt x2 to r/s appt since [facility] is OOO on the 20th. Unable to leave vm due to vm not being set up. Will not r/s until speaking with pt.

## 2024-04-04 ENCOUNTER — Telehealth: Payer: Self-pay

## 2024-04-04 NOTE — Telephone Encounter (Signed)
 Called pt on many attempts to r/s appt and unable to lvm due to vm not being set up. Provider OOO. A letter has been sent out for pt to call to r/s. canceled for now.

## 2024-04-24 ENCOUNTER — Ambulatory Visit: Payer: MEDICAID | Admitting: Surgery

## 2024-04-24 ENCOUNTER — Encounter (HOSPITAL_COMMUNITY): Payer: MEDICAID

## 2024-04-28 ENCOUNTER — Ambulatory Visit: Payer: MEDICAID | Admitting: Nurse Practitioner
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
## 2024-05-08 ENCOUNTER — Encounter: Payer: Self-pay | Admitting: Radiology

## 2024-07-12 ENCOUNTER — Encounter: Payer: MEDICAID | Admitting: Physical Medicine and Rehabilitation

## 2024-07-20 LAB — LAB REPORT - SCANNED: EGFR: 65

## 2024-08-01 ENCOUNTER — Other Ambulatory Visit: Payer: Self-pay | Admitting: Surgery

## 2024-08-01 DIAGNOSIS — I779 Disorder of arteries and arterioles, unspecified: Secondary | ICD-10-CM

## 2024-08-01 DIAGNOSIS — I6523 Occlusion and stenosis of bilateral carotid arteries: Secondary | ICD-10-CM

## 2024-08-08 ENCOUNTER — Encounter: Payer: MEDICAID | Admitting: Physical Medicine and Rehabilitation

## 2024-08-11 ENCOUNTER — Ambulatory Visit: Payer: MEDICAID | Admitting: Physical Medicine and Rehabilitation

## 2024-08-11 ENCOUNTER — Encounter: Payer: Self-pay | Admitting: Physical Medicine and Rehabilitation

## 2024-08-11 ENCOUNTER — Other Ambulatory Visit: Payer: Self-pay

## 2024-08-11 DIAGNOSIS — M5412 Radiculopathy, cervical region: Secondary | ICD-10-CM

## 2024-08-11 DIAGNOSIS — R202 Paresthesia of skin: Secondary | ICD-10-CM

## 2024-08-11 DIAGNOSIS — M47812 Spondylosis without myelopathy or radiculopathy, cervical region: Secondary | ICD-10-CM
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
## 2024-08-11 NOTE — Progress Notes (Unsigned)
 Pain Scale   Average Pain 10 Patient advising she has chronic neck pain that radiates bilaterally to both sides of shoulder.        +Driver, -BT, -Dye Allergies.

## 2024-08-14 ENCOUNTER — Ambulatory Visit: Payer: MEDICAID | Admitting: Surgery

## 2024-08-14 ENCOUNTER — Ambulatory Visit (HOSPITAL_COMMUNITY): Payer: MEDICAID
